# Patient Record
Sex: Male | Born: 1960
Health system: Southern US, Community
[De-identification: ages and names within clinical notes are randomized; demographics above are authoritative.]

## PROBLEM LIST (undated history)

## (undated) DIAGNOSIS — I2510 Atherosclerotic heart disease of native coronary artery without angina pectoris: Secondary | ICD-10-CM

## (undated) DIAGNOSIS — I10 Essential (primary) hypertension: Secondary | ICD-10-CM

## (undated) DIAGNOSIS — T8859XA Other complications of anesthesia, initial encounter: Secondary | ICD-10-CM

## (undated) DIAGNOSIS — I219 Acute myocardial infarction, unspecified: Secondary | ICD-10-CM

## (undated) DIAGNOSIS — I714 Abdominal aortic aneurysm, without rupture, unspecified: Secondary | ICD-10-CM

## (undated) DIAGNOSIS — I739 Peripheral vascular disease, unspecified: Secondary | ICD-10-CM

## (undated) DIAGNOSIS — M549 Dorsalgia, unspecified: Secondary | ICD-10-CM

## (undated) DIAGNOSIS — E785 Hyperlipidemia, unspecified: Secondary | ICD-10-CM

## (undated) DIAGNOSIS — I771 Stricture of artery: Secondary | ICD-10-CM

## (undated) HISTORY — PX: BACK SURGERY: SHX140

## (undated) HISTORY — DX: Acute myocardial infarction, unspecified: I21.9

## (undated) HISTORY — DX: Dorsalgia, unspecified: M54.9

## (undated) HISTORY — DX: Hyperlipidemia, unspecified: E78.5

## (undated) HISTORY — PX: TONSILLECTOMY: SUR1361

## (undated) SURGERY — Surgical Case
Anesthesia: *Unknown

---

## 2017-11-05 DIAGNOSIS — Z0189 Encounter for other specified special examinations: Secondary | ICD-10-CM | POA: Diagnosis not present

## 2017-11-05 DIAGNOSIS — Z6835 Body mass index (BMI) 35.0-35.9, adult: Secondary | ICD-10-CM | POA: Diagnosis not present

## 2017-11-05 DIAGNOSIS — S39012A Strain of muscle, fascia and tendon of lower back, initial encounter: Secondary | ICD-10-CM | POA: Diagnosis not present

## 2017-11-05 DIAGNOSIS — M545 Low back pain: Secondary | ICD-10-CM | POA: Diagnosis not present

## 2018-02-24 DIAGNOSIS — M533 Sacrococcygeal disorders, not elsewhere classified: Secondary | ICD-10-CM | POA: Diagnosis not present

## 2018-08-13 ENCOUNTER — Ambulatory Visit (INDEPENDENT_AMBULATORY_CARE_PROVIDER_SITE_OTHER): Payer: Medicare Other | Admitting: Urology

## 2018-08-13 DIAGNOSIS — N5201 Erectile dysfunction due to arterial insufficiency: Secondary | ICD-10-CM

## 2018-08-13 DIAGNOSIS — R3914 Feeling of incomplete bladder emptying: Secondary | ICD-10-CM | POA: Diagnosis not present

## 2018-09-17 ENCOUNTER — Ambulatory Visit (INDEPENDENT_AMBULATORY_CARE_PROVIDER_SITE_OTHER): Payer: Medicare Other | Admitting: Urology

## 2018-09-17 DIAGNOSIS — R3914 Feeling of incomplete bladder emptying: Secondary | ICD-10-CM | POA: Diagnosis not present

## 2018-09-17 DIAGNOSIS — N5201 Erectile dysfunction due to arterial insufficiency: Secondary | ICD-10-CM | POA: Diagnosis not present

## 2018-09-23 DIAGNOSIS — Z720 Tobacco use: Secondary | ICD-10-CM | POA: Diagnosis not present

## 2018-09-23 DIAGNOSIS — J309 Allergic rhinitis, unspecified: Secondary | ICD-10-CM | POA: Diagnosis not present

## 2018-09-23 DIAGNOSIS — G249 Dystonia, unspecified: Secondary | ICD-10-CM | POA: Diagnosis not present

## 2018-09-23 DIAGNOSIS — F17210 Nicotine dependence, cigarettes, uncomplicated: Secondary | ICD-10-CM | POA: Diagnosis not present

## 2018-09-23 DIAGNOSIS — Z79899 Other long term (current) drug therapy: Secondary | ICD-10-CM | POA: Diagnosis not present

## 2018-09-23 DIAGNOSIS — Z0189 Encounter for other specified special examinations: Secondary | ICD-10-CM | POA: Diagnosis not present

## 2018-09-23 DIAGNOSIS — Z6836 Body mass index (BMI) 36.0-36.9, adult: Secondary | ICD-10-CM | POA: Diagnosis not present

## 2018-09-23 DIAGNOSIS — N401 Enlarged prostate with lower urinary tract symptoms: Secondary | ICD-10-CM | POA: Diagnosis not present

## 2018-10-23 DIAGNOSIS — N5201 Erectile dysfunction due to arterial insufficiency: Secondary | ICD-10-CM | POA: Diagnosis not present

## 2018-12-09 DIAGNOSIS — J309 Allergic rhinitis, unspecified: Secondary | ICD-10-CM | POA: Diagnosis not present

## 2018-12-09 DIAGNOSIS — E782 Mixed hyperlipidemia: Secondary | ICD-10-CM | POA: Diagnosis not present

## 2018-12-09 DIAGNOSIS — M545 Low back pain: Secondary | ICD-10-CM | POA: Diagnosis not present

## 2018-12-09 DIAGNOSIS — N401 Enlarged prostate with lower urinary tract symptoms: Secondary | ICD-10-CM | POA: Diagnosis not present

## 2018-12-09 DIAGNOSIS — R229 Localized swelling, mass and lump, unspecified: Secondary | ICD-10-CM | POA: Diagnosis not present

## 2018-12-09 DIAGNOSIS — G249 Dystonia, unspecified: Secondary | ICD-10-CM | POA: Diagnosis not present

## 2018-12-09 DIAGNOSIS — R5383 Other fatigue: Secondary | ICD-10-CM | POA: Diagnosis not present

## 2018-12-09 DIAGNOSIS — Z720 Tobacco use: Secondary | ICD-10-CM | POA: Diagnosis not present

## 2018-12-09 DIAGNOSIS — E875 Hyperkalemia: Secondary | ICD-10-CM | POA: Diagnosis not present

## 2018-12-09 DIAGNOSIS — Z0000 Encounter for general adult medical examination without abnormal findings: Secondary | ICD-10-CM | POA: Diagnosis not present

## 2018-12-11 ENCOUNTER — Other Ambulatory Visit (HOSPITAL_BASED_OUTPATIENT_CLINIC_OR_DEPARTMENT_OTHER): Payer: Self-pay

## 2018-12-11 DIAGNOSIS — R5383 Other fatigue: Secondary | ICD-10-CM

## 2018-12-11 DIAGNOSIS — R0683 Snoring: Secondary | ICD-10-CM

## 2018-12-18 ENCOUNTER — Ambulatory Visit: Payer: Medicare Other | Attending: Internal Medicine | Admitting: Neurology

## 2018-12-18 ENCOUNTER — Other Ambulatory Visit: Payer: Self-pay

## 2018-12-18 DIAGNOSIS — G4733 Obstructive sleep apnea (adult) (pediatric): Secondary | ICD-10-CM | POA: Diagnosis not present

## 2018-12-18 DIAGNOSIS — R5383 Other fatigue: Secondary | ICD-10-CM | POA: Diagnosis not present

## 2018-12-18 DIAGNOSIS — R0683 Snoring: Secondary | ICD-10-CM | POA: Insufficient documentation

## 2018-12-27 NOTE — Procedures (Signed)
   [place] A. Merlene Laughter, MD     [email or web address]             HOME SLEEP STUDY  LOCATION: [place]  Patient Name: Anthony Todd, Anthony Todd Date: 12/18/2018 Gender: Male D.O.B: 05/16/1961 Age (years): 58 Referring Provider: Delphina Cahill Height (inches): 18 Interpreting Physician: Phillips Odor MD, ABSM Weight (lbs): 245 RPSGT: Peak, Robert BMI: 36 MRN: [id number] Neck Size: CLINICAL INFORMATION Sleep Study Type: HST     Indication for sleep study: Fatigue, Snoring     Epworth Sleepiness Score: NA  SLEEP STUDY TECHNIQUE A multi-channel overnight portable sleep study was performed. The channels recorded were: nasal airflow, thoracic respiratory movement, and oxygen saturation with a pulse oximetry. Snoring was also monitored.  MEDICATIONS No current outpatient medications on file.  SLEEP ARCHITECTURE Patient was studied for 342 minutes. The sleep efficiency was 71.3 % and the patient was supine for 5.1%. The arousal index was 0.0 per hour.  RESPIRATORY PARAMETERS The overall AHI was 2.1 per hour, with a central apnea index of 0.7 per hour.  The oxygen nadir was 83% during sleep.     CARDIAC DATA Mean heart rate during sleep was 70.1 bpm.  IMPRESSIONS No significant obstructive sleep apnea occurred during this study (AHI = 2.1/h). No significant central sleep apnea occurred during this study (CAI = 0.7/h).   Delano Metz, MD Diplomate, American Board of Sleep Medicine. ELECTRONICALLY SIGNED ON:  12/27/2018, 1:11 PM [place] PH: [phone]   FX: [phone] [place]

## 2019-01-07 ENCOUNTER — Ambulatory Visit: Payer: Medicare Other | Admitting: Urology

## 2019-01-07 ENCOUNTER — Other Ambulatory Visit: Payer: Medicare Other

## 2019-01-07 ENCOUNTER — Other Ambulatory Visit: Payer: Self-pay

## 2019-01-07 DIAGNOSIS — Z20822 Contact with and (suspected) exposure to covid-19: Secondary | ICD-10-CM

## 2019-01-07 DIAGNOSIS — R6889 Other general symptoms and signs: Secondary | ICD-10-CM | POA: Diagnosis not present

## 2019-01-07 NOTE — Progress Notes (Signed)
lab

## 2019-01-11 LAB — NOVEL CORONAVIRUS, NAA: SARS-CoV-2, NAA: NOT DETECTED

## 2019-01-20 ENCOUNTER — Telehealth: Payer: Self-pay | Admitting: Internal Medicine

## 2019-01-20 NOTE — Telephone Encounter (Signed)
General/Other - Results  °Covid-19 results given to patient ° °

## 2019-02-10 ENCOUNTER — Other Ambulatory Visit (HOSPITAL_COMMUNITY): Payer: Self-pay | Admitting: Family Medicine

## 2019-02-10 ENCOUNTER — Other Ambulatory Visit: Payer: Self-pay

## 2019-02-10 ENCOUNTER — Ambulatory Visit (HOSPITAL_COMMUNITY)
Admission: RE | Admit: 2019-02-10 | Discharge: 2019-02-10 | Disposition: A | Discharge status: Home / Self Care | Payer: Medicare Other | Source: Ambulatory Visit | Attending: Family Medicine | Admitting: Family Medicine

## 2019-02-10 DIAGNOSIS — M545 Low back pain, unspecified: Secondary | ICD-10-CM

## 2019-02-10 DIAGNOSIS — R5383 Other fatigue: Secondary | ICD-10-CM | POA: Diagnosis not present

## 2019-02-13 ENCOUNTER — Other Ambulatory Visit: Payer: Self-pay | Admitting: Internal Medicine

## 2019-02-13 ENCOUNTER — Other Ambulatory Visit (HOSPITAL_COMMUNITY): Payer: Self-pay | Admitting: Internal Medicine

## 2019-02-13 DIAGNOSIS — M5136 Other intervertebral disc degeneration, lumbar region: Secondary | ICD-10-CM

## 2019-02-17 ENCOUNTER — Other Ambulatory Visit: Payer: Self-pay

## 2019-02-17 ENCOUNTER — Ambulatory Visit (HOSPITAL_COMMUNITY)
Admission: RE | Admit: 2019-02-17 | Discharge: 2019-02-17 | Disposition: A | Discharge status: Home / Self Care | Payer: Medicare Other | Source: Ambulatory Visit | Attending: Internal Medicine | Admitting: Internal Medicine

## 2019-02-17 DIAGNOSIS — M545 Low back pain: Secondary | ICD-10-CM | POA: Diagnosis not present

## 2019-02-17 DIAGNOSIS — M5136 Other intervertebral disc degeneration, lumbar region: Secondary | ICD-10-CM | POA: Insufficient documentation

## 2019-02-18 ENCOUNTER — Ambulatory Visit (HOSPITAL_COMMUNITY): Payer: Medicare Other

## 2019-02-20 DIAGNOSIS — M5416 Radiculopathy, lumbar region: Secondary | ICD-10-CM | POA: Diagnosis not present

## 2019-02-20 DIAGNOSIS — M7070 Other bursitis of hip, unspecified hip: Secondary | ICD-10-CM | POA: Diagnosis not present

## 2019-02-26 ENCOUNTER — Other Ambulatory Visit: Payer: Self-pay | Admitting: Neurological Surgery

## 2019-02-26 DIAGNOSIS — M7070 Other bursitis of hip, unspecified hip: Secondary | ICD-10-CM

## 2019-02-26 DIAGNOSIS — M5416 Radiculopathy, lumbar region: Secondary | ICD-10-CM

## 2019-03-02 ENCOUNTER — Ambulatory Visit: Payer: Medicare Other | Attending: Internal Medicine | Admitting: Neurology

## 2019-03-02 ENCOUNTER — Other Ambulatory Visit: Payer: Self-pay

## 2019-03-02 DIAGNOSIS — J9601 Acute respiratory failure with hypoxia: Secondary | ICD-10-CM | POA: Diagnosis not present

## 2019-03-02 DIAGNOSIS — G4733 Obstructive sleep apnea (adult) (pediatric): Secondary | ICD-10-CM | POA: Diagnosis not present

## 2019-03-03 ENCOUNTER — Ambulatory Visit
Admission: RE | Admit: 2019-03-03 | Discharge: 2019-03-03 | Disposition: A | Discharge status: Home / Self Care | Payer: Medicare Other | Source: Ambulatory Visit | Attending: Neurological Surgery | Admitting: Neurological Surgery

## 2019-03-03 DIAGNOSIS — M5137 Other intervertebral disc degeneration, lumbosacral region: Secondary | ICD-10-CM | POA: Diagnosis not present

## 2019-03-03 DIAGNOSIS — M5416 Radiculopathy, lumbar region: Secondary | ICD-10-CM

## 2019-03-03 DIAGNOSIS — M5417 Radiculopathy, lumbosacral region: Secondary | ICD-10-CM | POA: Diagnosis not present

## 2019-03-03 MED ORDER — METHYLPREDNISOLONE ACETATE 40 MG/ML INJ SUSP (RADIOLOG: 120.0000 mg | Freq: Once | INTRAMUSCULAR | Status: DC

## 2019-03-03 MED ORDER — IOPAMIDOL (ISOVUE-M 200) INJECTION 41%: 1.0000 mL | Freq: Once | INTRAMUSCULAR | Status: DC

## 2019-03-03 NOTE — Discharge Instructions (Signed)

## 2019-03-05 NOTE — Procedures (Signed)
  [place] A. Merlene Laughter, MD     [email or web address]             HOME SLEEP TEST  LOCATION: [place]   Patient Name: Anthony Todd, Anthony Todd Date: 03/02/2019 Gender: Male D.O.B: [birth year] Age (years): 58 Referring Provider: Delphina Cahill Height (inches): 56 Interpreting Physician: Phillips Odor MD, ABSM Weight (lbs): 245 RPSGT: Peak, Robert BMI: 36 MRN: [id number] Neck Size: CLINICAL INFORMATION Sleep Study Type: HST     Indication for sleep study: N/A     Epworth Sleepiness Score: NA   Most recent polysomnogram dated 12/18/2018 revealed an AHI of 2.1/h and RDI of 2.1/h. SLEEP STUDY TECHNIQUE A multi-channel overnight portable sleep study was performed. The channels recorded were: nasal airflow, thoracic respiratory movement, and oxygen saturation with a pulse oximetry. Snoring was also monitored.  MEDICATIONS Patient self administered medications include: N/A.  SLEEP ARCHITECTURE Patient was studied for 362 minutes. The sleep efficiency was 82.6 % and the patient was supine for 5.9%. The arousal index was 0.0 per hour.  RESPIRATORY PARAMETERS The overall AHI was 6.3 per hour, with a central apnea index of 1.8 per hour.  The oxygen nadir was 89% during sleep.     CARDIAC DATA Mean heart rate during sleep was 67.3 bpm.  IMPRESSIONS 1.  Mild obstructive sleep apnea syndrome is noted with this study.  The severity does not require positive pressure treatment.  Delano Metz, MD Diplomate, American Board of Sleep Medicine.  ELECTRONICALLY SIGNED ON:  03/05/2019, 3:57 PM [place] PH: [phone]   FX: [phone] [place]

## 2019-03-12 DIAGNOSIS — G894 Chronic pain syndrome: Secondary | ICD-10-CM | POA: Diagnosis not present

## 2019-03-12 DIAGNOSIS — G4733 Obstructive sleep apnea (adult) (pediatric): Secondary | ICD-10-CM | POA: Diagnosis not present

## 2019-03-12 DIAGNOSIS — M545 Low back pain: Secondary | ICD-10-CM | POA: Diagnosis not present

## 2019-03-12 DIAGNOSIS — E782 Mixed hyperlipidemia: Secondary | ICD-10-CM | POA: Diagnosis not present

## 2019-03-20 DIAGNOSIS — M5431 Sciatica, right side: Secondary | ICD-10-CM | POA: Diagnosis not present

## 2019-03-20 DIAGNOSIS — M545 Low back pain: Secondary | ICD-10-CM | POA: Diagnosis not present

## 2019-03-20 DIAGNOSIS — M4726 Other spondylosis with radiculopathy, lumbar region: Secondary | ICD-10-CM | POA: Diagnosis not present

## 2019-03-20 DIAGNOSIS — M5432 Sciatica, left side: Secondary | ICD-10-CM | POA: Diagnosis not present

## 2019-03-20 DIAGNOSIS — M961 Postlaminectomy syndrome, not elsewhere classified: Secondary | ICD-10-CM | POA: Diagnosis not present

## 2019-03-24 DIAGNOSIS — G894 Chronic pain syndrome: Secondary | ICD-10-CM | POA: Diagnosis not present

## 2019-03-24 DIAGNOSIS — I714 Abdominal aortic aneurysm, without rupture: Secondary | ICD-10-CM | POA: Diagnosis not present

## 2019-03-24 DIAGNOSIS — M545 Low back pain: Secondary | ICD-10-CM | POA: Diagnosis not present

## 2019-03-24 DIAGNOSIS — G4733 Obstructive sleep apnea (adult) (pediatric): Secondary | ICD-10-CM | POA: Diagnosis not present

## 2019-03-26 ENCOUNTER — Other Ambulatory Visit: Payer: Self-pay

## 2019-03-26 ENCOUNTER — Encounter: Payer: Self-pay | Admitting: Vascular Surgery

## 2019-03-26 ENCOUNTER — Ambulatory Visit (INDEPENDENT_AMBULATORY_CARE_PROVIDER_SITE_OTHER): Payer: Medicare Other | Admitting: Vascular Surgery

## 2019-03-26 VITALS — BP 157/95 | HR 77 | Temp 97.2°F | Resp 20 | Ht 69.0 in | Wt 243.0 lb

## 2019-03-26 DIAGNOSIS — R7301 Impaired fasting glucose: Secondary | ICD-10-CM | POA: Diagnosis not present

## 2019-03-26 DIAGNOSIS — R37 Sexual dysfunction, unspecified: Secondary | ICD-10-CM | POA: Diagnosis not present

## 2019-03-26 DIAGNOSIS — E782 Mixed hyperlipidemia: Secondary | ICD-10-CM | POA: Diagnosis not present

## 2019-03-26 DIAGNOSIS — Z23 Encounter for immunization: Secondary | ICD-10-CM | POA: Diagnosis not present

## 2019-03-26 DIAGNOSIS — I714 Abdominal aortic aneurysm, without rupture, unspecified: Secondary | ICD-10-CM

## 2019-03-26 DIAGNOSIS — I739 Peripheral vascular disease, unspecified: Secondary | ICD-10-CM | POA: Diagnosis not present

## 2019-03-26 DIAGNOSIS — E875 Hyperkalemia: Secondary | ICD-10-CM | POA: Diagnosis not present

## 2019-03-26 NOTE — Progress Notes (Signed)
Patient name: Yamato Kopf MRN: [id number] DOB: 1960-12-13 Sex: male  REASON FOR CONSULT: Abdominal aortic aneurysm  HPI: Otoniel Myhand is a 58 y.o. male, who was discovered to have an incidental finding of a 3 cm infrarenal abdominal aortic aneurysm on recent MRI scan for back pain.  Patient has had chronic back pain for several years.  He has no family history of abdominal aortic aneurysm.  He currently smokes 2 pack of cigarettes per day.  Greater than 3 minutes today spent regarding smoking cessation counseling.  He has no abdominal pain.  He has had no prior abdominal operations.  He does describe a spasm type sensation in his left calf.  He does not really describe this when walking.  However, he has weakness that occurs in both legs with walking.  He is currently under the impression this is related to his back.  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  He is on a statin.   Past Medical History:  Diagnosis Date  . Back pain   . Hyperlipidemia    Past Surgical History:  Procedure Laterality Date  . BACK SURGERY      History reviewed. No pertinent family history.  SOCIAL HISTORY: Social History   Socioeconomic History  . Marital status: Married    Spouse name: Not on file  . Number of children: Not on file  . Years of education: Not on file  . Highest education level: Not on file  Occupational History  . Not on file  Social Needs  . Financial resource strain: Not on file  . Food insecurity    Worry: Not on file    Inability: Not on file  . Transportation needs    Medical: Not on file    Non-medical: Not on file  Tobacco Use  . Smoking status: Current Every Day Smoker    Packs/day: 2.00    Types: Cigarettes  . Smokeless tobacco: Never Used  Substance and Sexual Activity  . Alcohol use: Not Currently  . Drug use: Never  . Sexual activity: Not on file  Lifestyle  . Physical activity    Days per  week: Not on file    Minutes per session: Not on file  . Stress: Not on file  Relationships  . Social Herbalist on phone: Not on file    Gets together: Not on file    Attends religious service: Not on file    Active member of club or organization: Not on file    Attends meetings of clubs or organizations: Not on file    Relationship status: Not on file  . Intimate partner violence    Fear of current or ex partner: Not on file    Emotionally abused: Not on file    Physically abused: Not on file    Forced sexual activity: Not on file  Other Topics Concern  . Not on file  Social History Narrative  . Not on file    Allergies  Allergen Reactions  . Cymbalta [Duloxetine Hcl] Rash    Current Outpatient Medications  Medication Sig Dispense Refill  . oxyCODONE-acetaminophen (PERCOCET/ROXICET) 5-325 MG tablet TK 1 T PO BID PRF SEVERE PAIN    . simvastatin (ZOCOR) 10 MG tablet TK 1 T PO  QPM    . tamsulosin (FLOMAX) 0.4 MG CAPS capsule Take 0.4 mg by mouth at bedtime.  No current facility-administered medications for this visit.     ROS:   General:  No weight loss, Fever, chills  HEENT: No recent headaches, no nasal bleeding, no visual changes, no sore throat  Neurologic: No dizziness, blackouts, seizures. No recent symptoms of stroke or mini- stroke. No recent episodes of slurred speech, or temporary blindness.  Cardiac: No recent episodes of chest pain/pressure, no shortness of breath at rest.  No shortness of breath with exertion.  Denies history of atrial fibrillation or irregular heartbeat  Vascular: No history of rest pain in feet.  No history of claudication.  No history of non-healing ulcer, No history of DVT   Pulmonary: No home oxygen, no productive cough, no hemoptysis,  No asthma or wheezing  Musculoskeletal:  [X]  Arthritis, [X]  Low back pain,  [X]  Joint pain  Hematologic:No history of hypercoagulable state.  No history of easy bleeding.  No history  of anemia  Gastrointestinal: No hematochezia or melena,  No gastroesophageal reflux, no trouble swallowing  Urinary: [ ]  chronic Kidney disease, [ ]  on HD - [ ]  MWF or [ ]  TTHS, [ ]  Burning with urination, [ ]  Frequent urination, [ ]  Difficulty urinating;   Skin: No rashes  Psychological: No history of anxiety,  No history of depression   Physical Examination  Vitals:   03/26/19 1317  BP: (!) 157/95  Pulse: 77  Resp: 20  Temp: (!) 97.2 F (36.2 C)  SpO2: 94%  Weight: 243 lb (110.2 kg)  Height: 5\' 9"  (1.753 m)    Body mass index is 35.88 kg/m.  General:  Alert and oriented, no acute distress HEENT: Normal Neck: No JVD Pulmonary: Clear to auscultation bilaterally Cardiac: Regular Rate and Rhythm  Abdomen: Soft, non-tender, non-distended, no mass, no scars, diastases of midline fascia Skin: No rash Extremity Pulses:  2+ radial, brachial, 2+ right weakly palpable left femoral, absent right dorsalis pedis 2+ posterior tibial pulse absent left plus healed dorsalis pedis, posterior tibial pulses bilaterally Musculoskeletal: No deformity or edema  Neurologic: Upper and lower extremity motor 5/5 and symmetric  DATA:  I reviewed the patient's MRI exam which shows a focal area of 3 cm dilation of the abdominal aorta.  Adjacent abdominal aorta is about 2 to 2-1/2 cm.  Iliac arteries not imaged on the scan.  ASSESSMENT: Patient with small abdominal aortic aneurysm.  I discussed the patient that we would consider repair if aneurysm reaches 5 to 5-1/2 cm in diameter.  Also discussed the risk of rupture of less than 5 and half centimeter aneurysm is 0.5 %/year.  He was reassured by this.  I did discuss with him that we would need to reimage this in 2 to 3 years.  However, he also has some symptoms that may be claudication related and his iliac arteries have not been imaged.  With his history of smoking he also may have a component of peripheral arterial disease.  He also needs to be  evaluated to make sure he does not have an iliac aneurysm component.  In light of this we will schedule him for a CT angiogram of the abdomen and pelvis with lower extremity runoff to further define his arterial anatomy.  If he has significant peripheral arterial disease we may consider an intervention for the left leg since this seems to be very bothersome to him with walking.  This is a good portion of the reason he is having his back evaluated.   PLAN: #1 CT angiogram with lower extremity  runoff to evaluate peripheral arterial disease component as well as possibility of iliac aneurysms.  Patient will have a virtual visit and discussed the findings of his CT scan after this has been performed.  2.  Patient needs repeat ultrasound and a visit with our PA clinic in 2 to 3 years for repeat aortic ultrasound for the infrarenal abdominal aorta.   Fabienne Brunsharles , MD [facility] Office: [phone] Pager: [phone]

## 2019-03-31 ENCOUNTER — Other Ambulatory Visit: Payer: Self-pay | Admitting: Vascular Surgery

## 2019-03-31 DIAGNOSIS — I739 Peripheral vascular disease, unspecified: Secondary | ICD-10-CM

## 2019-03-31 DIAGNOSIS — I714 Abdominal aortic aneurysm, without rupture, unspecified: Secondary | ICD-10-CM

## 2019-04-01 DIAGNOSIS — K429 Umbilical hernia without obstruction or gangrene: Secondary | ICD-10-CM | POA: Diagnosis not present

## 2019-04-01 DIAGNOSIS — I714 Abdominal aortic aneurysm, without rupture: Secondary | ICD-10-CM | POA: Diagnosis not present

## 2019-04-01 DIAGNOSIS — E669 Obesity, unspecified: Secondary | ICD-10-CM | POA: Diagnosis not present

## 2019-04-02 DIAGNOSIS — I714 Abdominal aortic aneurysm, without rupture: Secondary | ICD-10-CM | POA: Diagnosis not present

## 2019-04-02 DIAGNOSIS — E782 Mixed hyperlipidemia: Secondary | ICD-10-CM | POA: Diagnosis not present

## 2019-04-02 DIAGNOSIS — G4733 Obstructive sleep apnea (adult) (pediatric): Secondary | ICD-10-CM | POA: Diagnosis not present

## 2019-04-02 DIAGNOSIS — N401 Enlarged prostate with lower urinary tract symptoms: Secondary | ICD-10-CM | POA: Diagnosis not present

## 2019-04-02 DIAGNOSIS — F5221 Male erectile disorder: Secondary | ICD-10-CM | POA: Diagnosis not present

## 2019-04-02 DIAGNOSIS — R7301 Impaired fasting glucose: Secondary | ICD-10-CM | POA: Diagnosis not present

## 2019-04-02 DIAGNOSIS — R945 Abnormal results of liver function studies: Secondary | ICD-10-CM | POA: Diagnosis not present

## 2019-04-02 DIAGNOSIS — G894 Chronic pain syndrome: Secondary | ICD-10-CM | POA: Diagnosis not present

## 2019-04-02 DIAGNOSIS — M545 Low back pain: Secondary | ICD-10-CM | POA: Diagnosis not present

## 2019-04-10 DIAGNOSIS — M5431 Sciatica, right side: Secondary | ICD-10-CM | POA: Diagnosis not present

## 2019-04-10 DIAGNOSIS — M4726 Other spondylosis with radiculopathy, lumbar region: Secondary | ICD-10-CM | POA: Diagnosis not present

## 2019-04-10 DIAGNOSIS — M961 Postlaminectomy syndrome, not elsewhere classified: Secondary | ICD-10-CM | POA: Diagnosis not present

## 2019-04-10 DIAGNOSIS — M5432 Sciatica, left side: Secondary | ICD-10-CM | POA: Diagnosis not present

## 2019-04-20 ENCOUNTER — Ambulatory Visit
Admission: RE | Admit: 2019-04-20 | Discharge: 2019-04-20 | Disposition: A | Discharge status: Home / Self Care | Payer: Medicare Other | Source: Ambulatory Visit | Attending: Vascular Surgery | Admitting: Vascular Surgery

## 2019-04-20 DIAGNOSIS — M545 Low back pain: Secondary | ICD-10-CM | POA: Diagnosis not present

## 2019-04-20 DIAGNOSIS — I739 Peripheral vascular disease, unspecified: Secondary | ICD-10-CM

## 2019-04-20 DIAGNOSIS — I714 Abdominal aortic aneurysm, without rupture, unspecified: Secondary | ICD-10-CM

## 2019-04-20 DIAGNOSIS — E291 Testicular hypofunction: Secondary | ICD-10-CM | POA: Diagnosis not present

## 2019-04-20 MED ORDER — IOPAMIDOL (ISOVUE-370) INJECTION 76%
100.0000 mL | Freq: Once | INTRAVENOUS | Status: AC | PRN
  Administered 2019-04-20: 100 mL via INTRAVENOUS

## 2019-04-22 ENCOUNTER — Telehealth: Payer: Self-pay | Admitting: *Deleted

## 2019-04-22 NOTE — Telephone Encounter (Signed)
Virtual Visit Pre-Appointment Phone Call  Today, I spoke with Anthony Todd and performed the following actions:  1. I explained that we are currently trying to limit exposure to the COVID-19 virus by seeing patients at home rather than in the office.  I explained that the visits are best done by video, but can be done by telephone.  I asked the patient if a virtual visit that the patient would like to try instead of coming into the office. Anthony Todd agreed to proceed with the virtual visit scheduled with Ruta Hinds MD on 04/22/19.     2. I confirmed the BEST phone number to call the day of the visit and- I included this in appointment notes.  3. I asked if the patient had access to (through a family member/friend) a smartphone with video capability to be used for his visit?"  The patient said yes -   4. I confirmed consent by  a. sending through [place] or by email the [place] as written at the end of this message or  b. verbally as listed below. i. This visit is being performed in the setting of COVID-19. ii. All virtual visits are billed to your insurance company just like a normal visit would be.   iii. We'd like you to understand that the technology does not allow for your provider to perform an examination, and thus may limit your provider's ability to fully assess your condition.  iv. If your provider identifies any concerns that need to be evaluated in person, we will make arrangements to do so.   v. Finally, though the technology is pretty good, we cannot assure that it will always work on either your or our end, and in the setting of a video visit, we may have to convert it to a phone-only visit.  In either situation, we cannot ensure that we have a secure connection.   vi. Are you willing to proceed?"  STAFF: Did the patient verbally acknowledge consent to telehealth visit? Document YES/NO here: *Yes  2. I advised the patient to be prepared  - I asked that the patient, on the day of his visit, record any information possible with the equipment at his home, such as blood pressure, pulse, oxygen saturation, and your weight and write them all down. I asked the patient to have a pen and paper handy nearby the day of the visit as well.  3. If the patient was scheduled for a video visit, I informed the patient that the visit with the doctor would start with a text to the smartphone # given to [place] by the patient.         If the patient was scheduled for a telephone call, I informed the patient that the visit with the doctor would start with a call to the telephone # given to [place] by the patient.  4. I Informed patient they will receive a phone call 15 minutes prior to their appointment time from a [place] or nurse to review medications, allergies, etc. to prepare for the visit.    TELEPHONE CALL NOTE  Anthony Todd has been deemed a candidate for a follow-up tele-health visit to limit community exposure during the Covid-19 pandemic. I spoke with the patient via phone to ensure availability of phone/video source, confirm preferred email & phone number, and discuss instructions and expectations.  I reminded Anthony Todd to be prepared with any vital sign and/or heart rhythm information that  could potentially be obtained via home monitoring, at the time of his visit. I reminded Anthony Todd to expect a phone call prior to his visit.  Anthony Todd, NT 04/22/2019 10:55 AM     FULL LENGTH CONSENT FOR TELE-HEALTH VISIT   I hereby voluntarily request, consent and authorize [facility] and its employed or contracted physicians, physician assistants, nurse practitioners or other licensed health care professionals (the Practitioner), to provide me with telemedicine health care services (the "Services") as deemed necessary by the treating Practitioner. I acknowledge and consent to receive the Services by the Practitioner via telemedicine. I understand that  the telemedicine visit will involve communicating with the Practitioner through live audiovisual communication technology and the disclosure of certain medical information by electronic transmission. I acknowledge that I have been given the opportunity to request an in-person assessment or other available alternative prior to the telemedicine visit and am voluntarily participating in the telemedicine visit.  I understand that I have the right to withhold or withdraw my consent to the use of telemedicine in the course of my care at any time, without affecting my right to future care or treatment, and that the Practitioner or I may terminate the telemedicine visit at any time. I understand that I have the right to inspect all information obtained and/or recorded in the course of the telemedicine visit and may receive copies of available information for a reasonable fee.  I understand that some of the potential risks of receiving the Services via telemedicine include:  [place] Delay or interruption in medical evaluation due to technological equipment failure or disruption; . Information transmitted may not be sufficient (e.g. poor resolution of images) to allow for appropriate medical decision making by the Practitioner; and/or  . In rare instances, security protocols could fail, causing a breach of personal health information.  Furthermore, I acknowledge that it is my responsibility to provide information about my medical history, conditions and care that is complete and accurate to the best of my ability. I acknowledge that Practitioner's advice, recommendations, and/or decision may be based on factors not within their control, such as incomplete or inaccurate data provided by me or distortions of diagnostic images or specimens that may result from electronic transmissions. I understand that the practice of medicine is not an exact science and that Practitioner makes no warranties or guarantees regarding treatment  outcomes. I acknowledge that I will receive a copy of this consent concurrently upon execution via email to the email address I last provided but may also request a printed copy by calling the office of [facility].    I understand that my insurance will be billed for this visit.   I have read or had this consent read to me. . I understand the contents of this consent, which adequately explains the benefits and risks of the Services being provided via telemedicine.  . I have been provided ample opportunity to ask questions regarding this consent and the Services and have had my questions answered to my satisfaction. . I give my informed consent for the services to be provided through the use of telemedicine in my medical care  By participating in this telemedicine visit I agree to the above.

## 2019-04-23 ENCOUNTER — Ambulatory Visit (INDEPENDENT_AMBULATORY_CARE_PROVIDER_SITE_OTHER): Payer: Medicare Other | Admitting: Vascular Surgery

## 2019-04-23 ENCOUNTER — Other Ambulatory Visit: Payer: Self-pay

## 2019-04-23 DIAGNOSIS — I714 Abdominal aortic aneurysm, without rupture, unspecified: Secondary | ICD-10-CM

## 2019-04-23 DIAGNOSIS — I739 Peripheral vascular disease, unspecified: Secondary | ICD-10-CM

## 2019-04-23 NOTE — Progress Notes (Signed)
Virtual Visit via Video Note   I connected with Anthony Todd on 04/23/2019 using the telephone platform and verified that I was speaking with the correct person using two identifiers.  The patient did not have technical capacity for a video virtual visit.  Patient was located at home and accompanied by no one. I am located at our office on [facility].   The limitations of evaluation and management by telemedicine and the availability of in person appointments have been previously discussed with the patient and are documented in the patients chart. The patient expressed understanding and consented to proceed.   PCP: Celene Squibb, MD  History of Present Illness: Patient is a 58 year old male who returns for follow-up today.  He was last seen on March 26, 2019.  He was initially seen for an incidental finding of a 3 cm infrarenal abdominal aortic aneurysm seen on recent MRI scan for back pain.  Returns today for follow-up regarding CT angio with runoff.  He currently has weakness that occurs in both legs with walking.  He has been seen by a spine surgeon regarding this and they are contemplating an operation.  The patient did ask today about whether or not erectile dysfunction could be related to his arterial occlusive disease.  I discussed with him that most likely this is related to his cigarette smoking and small vessel disease rather than the obstruction that we found on the CT scan.  Patient states that his left leg is worse than his right.  He has some symptoms that are suggestive of claudication but certainly not classic.   Past Medical History:  Diagnosis Date  . Back pain   . Hyperlipidemia     Past Surgical History:  Procedure Laterality Date  . BACK SURGERY      Current Meds  Medication Sig  . oxyCODONE-acetaminophen (PERCOCET/ROXICET) 5-325 MG tablet TK 1 T PO BID PRF SEVERE PAIN  . simvastatin (ZOCOR) 10 MG tablet TK 1 T PO  QPM  . tamsulosin (FLOMAX) 0.4 MG CAPS  capsule Take 0.4 mg by mouth at bedtime.     Observations/Objective: Reviewed the patient's images from his CT angiogram.  This is a 50% right common iliac artery stenosis.  Otherwise the right lower extremity has intact runoff with no other significant flow-limiting stenosis below this.  On the left side the left common iliac artery is occluded.  It is atretic.  This is suggestive of a congenital lesion but certainly potentially related to his smoking.  His left groin down to the foot has normal arterial circulation with no flow-limiting stenosis.  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  He has some symptoms suggestive of claudication.  However percent of his symptoms are probably related to his back pain as well.  I discussed with the patient the possibility of revascularization to improve his symptoms.  However, I also discussed with him that all of his symptoms might not resolve if some of this is related to his back issues.  Certainly some of his left leg symptoms would be secondary to the left common iliac artery occlusion.  Follow Up Instructions:  The patient is scheduled to have a back operation in the near future.  I told him that he should discuss the findings from his CT scan and that I would forward my note to his spine surgeon.  A decision will need to be made whether or  not we are going to pursue further options for revascularization of his iliac artery versus whether or not he would proceed with his spine surgery first.  Patient also does have a 3 cm abdominal aortic aneurysm.  This will need to be followed long-term.  He will need a repeat ultrasound of this in 2 years regardless.   I discussed the assessment and treatment plan with the patient. The patient was provided an opportunity to ask questions and all were answered. The patient agreed with the plan and demonstrated an understanding of the instructions.    The patient was advised to call back or seek an in-person evaluation if the symptoms worsen or if the condition fails to improve as anticipated.  I spent 15 minutes with the patient via telephone encounter.   Signed, Fabienne Bruns [facility] [place] Office: [phone]  04/23/2019, 6:55 PM

## 2019-04-24 ENCOUNTER — Telehealth: Payer: Self-pay

## 2019-04-24 NOTE — Telephone Encounter (Signed)
Pt called and said that he wanted to proceed with surgery and said that he was talking with his spine dr and they agreed. He said that he is in a lot of pain and would like [place] to refill his pain meds.   Called pt and told him that when Dr Oneida Alar is back in the office I will discuss with him the steps to proceed with surgery if the surgeon agrees that will be doing his spine.  Advised him that in regards to the pain meds, he would need to contact their office as they have been giving him narcotics for his back pain. Explained only one dr can write meds for narcotics.   York Cerise, CMA

## 2019-05-18 ENCOUNTER — Other Ambulatory Visit: Payer: Self-pay

## 2019-05-20 ENCOUNTER — Telehealth: Payer: Self-pay | Admitting: *Deleted

## 2019-05-20 NOTE — Telephone Encounter (Signed)
Patient called and instructed to be at [facility] admitting at 7:30 am on 06/05/2019. All other instructions unchanged. Understands nasal swab at [facility] on 06/03/2019.

## 2019-06-03 ENCOUNTER — Other Ambulatory Visit: Payer: Self-pay

## 2019-06-03 ENCOUNTER — Other Ambulatory Visit (HOSPITAL_COMMUNITY)
Admission: RE | Admit: 2019-06-03 | Discharge: 2019-06-03 | Disposition: A | Discharge status: Home / Self Care | Payer: Medicare Other | Source: Ambulatory Visit | Attending: Vascular Surgery | Admitting: Vascular Surgery

## 2019-06-03 DIAGNOSIS — Z01812 Encounter for preprocedural laboratory examination: Secondary | ICD-10-CM | POA: Diagnosis not present

## 2019-06-03 DIAGNOSIS — Z20828 Contact with and (suspected) exposure to other viral communicable diseases: Secondary | ICD-10-CM | POA: Insufficient documentation

## 2019-06-03 LAB — SARS CORONAVIRUS 2 (TAT 6-24 HRS): SARS Coronavirus 2: NEGATIVE

## 2019-06-05 ENCOUNTER — Ambulatory Visit (HOSPITAL_COMMUNITY)
Admission: RE | Admit: 2019-06-05 | Discharge: 2019-06-05 | Disposition: A | Discharge status: Home / Self Care | Payer: Medicare Other | Attending: Vascular Surgery | Admitting: Vascular Surgery

## 2019-06-05 ENCOUNTER — Other Ambulatory Visit: Payer: Self-pay

## 2019-06-05 ENCOUNTER — Encounter (HOSPITAL_COMMUNITY)
Admission: RE | Disposition: A | Discharge status: Home / Self Care | Payer: Self-pay | Source: Home / Self Care | Attending: Vascular Surgery

## 2019-06-05 ENCOUNTER — Telehealth: Payer: Self-pay | Admitting: Vascular Surgery

## 2019-06-05 DIAGNOSIS — F17210 Nicotine dependence, cigarettes, uncomplicated: Secondary | ICD-10-CM | POA: Insufficient documentation

## 2019-06-05 DIAGNOSIS — Z79899 Other long term (current) drug therapy: Secondary | ICD-10-CM | POA: Diagnosis not present

## 2019-06-05 DIAGNOSIS — I745 Embolism and thrombosis of iliac artery: Secondary | ICD-10-CM | POA: Insufficient documentation

## 2019-06-05 DIAGNOSIS — E785 Hyperlipidemia, unspecified: Secondary | ICD-10-CM | POA: Diagnosis not present

## 2019-06-05 DIAGNOSIS — Z888 Allergy status to other drugs, medicaments and biological substances status: Secondary | ICD-10-CM | POA: Insufficient documentation

## 2019-06-05 DIAGNOSIS — I714 Abdominal aortic aneurysm, without rupture: Secondary | ICD-10-CM | POA: Diagnosis not present

## 2019-06-05 DIAGNOSIS — I70212 Atherosclerosis of native arteries of extremities with intermittent claudication, left leg: Secondary | ICD-10-CM

## 2019-06-05 HISTORY — PX: ABDOMINAL AORTOGRAM W/LOWER EXTREMITY: CATH118223

## 2019-06-05 HISTORY — PX: PERIPHERAL VASCULAR BALLOON ANGIOPLASTY: CATH118281

## 2019-06-05 LAB — POCT ACTIVATED CLOTTING TIME
Activated Clotting Time: 197 seconds
Activated Clotting Time: 241 seconds
Activated Clotting Time: 186 seconds

## 2019-06-05 LAB — POCT I-STAT, CHEM 8
BUN: 10 mg/dL (ref 6–20)
Calcium, Ion: 1.19 mmol/L (ref 1.15–1.40)
Chloride: 103 mmol/L (ref 98–111)
Creatinine, Ser: 1 mg/dL (ref 0.61–1.24)
Glucose, Bld: 93 mg/dL (ref 70–99)
HCT: 46 % (ref 39.0–52.0)
Hemoglobin: 15.6 g/dL (ref 13.0–17.0)
Potassium: 4 mmol/L (ref 3.5–5.1)
Sodium: 140 mmol/L (ref 135–145)
TCO2: 27 mmol/L (ref 22–32)

## 2019-06-05 SURGERY — ABDOMINAL AORTOGRAM W/LOWER EXTREMITY
Anesthesia: LOCAL

## 2019-06-05 MED ORDER — LIDOCAINE HCL (PF) 1 % IJ SOLN
INTRAMUSCULAR | Status: DC | PRN
  Administered 2019-06-05 (×2): 30 mL

## 2019-06-05 MED ORDER — ACETAMINOPHEN 325 MG PO TABS
650.0000 mg | ORAL_TABLET | ORAL | Status: DC | PRN
  Administered 2019-06-05: 650 mg via ORAL
  Filled 2019-06-05: qty 2

## 2019-06-05 MED ORDER — HEPARIN (PORCINE) IN NACL 1000-0.9 UT/500ML-% IV SOLN
INTRAVENOUS | Status: DC | PRN
  Administered 2019-06-05: 500 mL

## 2019-06-05 MED ORDER — SODIUM CHLORIDE 0.9 % IV SOLN: INTRAVENOUS | Status: DC

## 2019-06-05 MED ORDER — HEPARIN (PORCINE) IN NACL 1000-0.9 UT/500ML-% IV SOLN
INTRAVENOUS | Status: AC
  Filled 2019-06-05: qty 1000

## 2019-06-05 MED ORDER — ONDANSETRON HCL 4 MG/2ML IJ SOLN: 4.0000 mg | Freq: Four times a day (QID) | INTRAMUSCULAR | Status: DC | PRN

## 2019-06-05 MED ORDER — SODIUM CHLORIDE 0.9% FLUSH: 3.0000 mL | Freq: Two times a day (BID) | INTRAVENOUS | Status: DC

## 2019-06-05 MED ORDER — MORPHINE SULFATE (PF) 4 MG/ML IV SOLN
2.0000 mg | INTRAVENOUS | Status: DC | PRN
  Administered 2019-06-05: 2 mg via INTRAVENOUS

## 2019-06-05 MED ORDER — SODIUM CHLORIDE 0.9% FLUSH: 3.0000 mL | INTRAVENOUS | Status: DC | PRN

## 2019-06-05 MED ORDER — LABETALOL HCL 5 MG/ML IV SOLN: 10.0000 mg | INTRAVENOUS | Status: DC | PRN

## 2019-06-05 MED ORDER — HEPARIN SODIUM (PORCINE) 1000 UNIT/ML IJ SOLN
INTRAMUSCULAR | Status: DC | PRN
  Administered 2019-06-05: 10000 [IU] via INTRAVENOUS

## 2019-06-05 MED ORDER — HEPARIN SODIUM (PORCINE) 1000 UNIT/ML IJ SOLN
INTRAMUSCULAR | Status: AC
  Filled 2019-06-05: qty 1

## 2019-06-05 MED ORDER — HYDRALAZINE HCL 20 MG/ML IJ SOLN: 5.0000 mg | INTRAMUSCULAR | Status: DC | PRN

## 2019-06-05 MED ORDER — MORPHINE SULFATE (PF) 2 MG/ML IV SOLN
INTRAVENOUS | Status: AC
  Filled 2019-06-05: qty 1

## 2019-06-05 MED ORDER — SODIUM CHLORIDE 0.9 % IV SOLN: 250.0000 mL | INTRAVENOUS | Status: DC | PRN

## 2019-06-05 MED ORDER — SODIUM CHLORIDE 0.9 % IV SOLN
INTRAVENOUS | Status: DC
  Administered 2019-06-05: 09:00:00 via INTRAVENOUS

## 2019-06-05 MED ORDER — LIDOCAINE HCL (PF) 1 % IJ SOLN
INTRAMUSCULAR | Status: AC
  Filled 2019-06-05: qty 30

## 2019-06-05 SURGICAL SUPPLY — 14 items
CATH ANGIO 5F PIGTAIL 65CM (CATHETERS) ×3 IMPLANT
CATH BEACON 5 .035 65 KMP TIP (CATHETERS) ×3 IMPLANT
CATH CROSS OVER TEMPO 5F (CATHETERS) ×3 IMPLANT
CATH SOFTOUCH MOTARJEME 5F (CATHETERS) ×3 IMPLANT
GUIDEWIRE ANGLED .035X150CM (WIRE) ×3 IMPLANT
KIT ENCORE 26 ADVANTAGE (KITS) ×3 IMPLANT
KIT PV (KITS) ×3 IMPLANT
SHEATH PINNACLE 5F 10CM (SHEATH) ×6 IMPLANT
SHEATH PROBE COVER 6X72 (BAG) ×3 IMPLANT
SYR MEDRAD MARK V 150ML (SYRINGE) ×3 IMPLANT
TRANSDUCER W/STOPCOCK (MISCELLANEOUS) ×3 IMPLANT
TRAY PV CATH (CUSTOM PROCEDURE TRAY) ×3 IMPLANT
WIRE BENTSON .035X145CM (WIRE) ×3 IMPLANT
WIRE HITORQ VERSACORE ST 145CM (WIRE) ×3 IMPLANT

## 2019-06-05 NOTE — Telephone Encounter (Signed)
   Primary Cardiologist:No primary care provider on file.  Chart reviewed as part of pre-operative protocol coverage. Because of Anthony Todd past medical history and time since last visit, he/she will require a follow-up visit in order to better assess preoperative cardiovascular risk.  This patient has never been seen by [facility] and will therefore require a new patient evaluation by MD prior to cardiac clearance.   Pre-op covering staff: - Please schedule appointment and call patient to inform them. - Please contact requesting surgeon's office via preferred method (i.e, phone, fax) to inform them of need for appointment prior to surgery.  If applicable, this message will also be routed to pharmacy pool and/or primary cardiologist for input on holding anticoagulant/antiplatelet agent as requested below so that this information is available at time of patient's appointment.   Kathyrn Drown, NP  06/05/2019, 4:01 PM

## 2019-06-05 NOTE — Telephone Encounter (Signed)
°  ° °  [facility] Pre-operative Risk Assessment    Request for surgical clearance:  1. What type of surgery is being performed? Femoral Femoral Bypass   2. When is this surgery scheduled? 06-15-19  3. What type of clearance is required (medical clearance vs. Pharmacy clearance to hold med vs. Both)? both  4. Are there any medications that need to be held prior to surgery and how long? [facility] Clinic is not sure. Up to the doctors  5. Practice name and name of physician performing surgery? Dr. Oneida Alar  6. What is your office phone number:    7.   What is your office fax number: [phone]  8.   Anesthesia type (None, local, MAC, general) ?:    Lynn from the [facility] was hoping we could see the patient and give him surgical clearance for this procedure. The patient is not established with a cardiologist, but will need cardiac clearance for his procedure.   Jeani Hawking would like the clearance appt to be done ASAP, because otherwise this procedure will have to be rescheduled.   Johnna Acosta 06/05/2019, 3:24 PM  _________________________________________________________________   (provider comments below)

## 2019-06-05 NOTE — Discharge Instructions (Signed)

## 2019-06-05 NOTE — Progress Notes (Signed)
Site area: Left groin a 5 french arterial sheath was removed  Site Prior to Removal:  Level 0  Pressure Applied For 20 MINUTES    Bedrest Beginning at 1430p  Manual:   Yes.    Patient Status During Pull:  stable  Post Pull Groin Site:  Level 0  Post Pull Instructions Given:  Yes.    Post Pull Pulses Present:  Yes.    Dressing Applied:  Yes.    Comments:

## 2019-06-05 NOTE — Progress Notes (Signed)
VS pre sheath pull: BP 147/67, HR 73, RR 15, O2 sat 98% room air Sheath location: right femoral Arterial/Venous: artery Manual pressure hold time: 20 minutes Bedrest begins: 1430 Pulses: right PT palpable, doppler left PT  Site condition: level 0 VS post sheath pull: BP 124/57, RR 13, HR 65, O2 sats 97% Post instructions given to patient.

## 2019-06-05 NOTE — H&P (Signed)
Patient name: Anthony Todd MRN: [id number] DOB: 12/20/1960 Sex: male  REASON FOR CONSULT: Abdominal aortic aneurysm  HPI:  Anthony Todd is a 58 y.o. male, who was discovered to have an incidental finding of a 3 cm infrarenal abdominal aortic aneurysm on recent MRI scan for back pain. Patient has had chronic back pain for several years. He has no family history of abdominal aortic aneurysm. He currently smokes 2 pack of cigarettes per day. Greater than 3 minutes today spent regarding smoking cessation counseling. He has no abdominal pain. He has had no prior abdominal operations. He does describe a spasm type sensation in his left calf. He does not really describe this when walking. However, he has weakness that occurs in both legs with walking. He is currently under the impression this is related to his back. 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 He is on a statin.      Past Medical History:  Diagnosis Date  . Back pain   . Hyperlipidemia         Past Surgical History:  Procedure Laterality Date  . BACK SURGERY    History reviewed. No pertinent family history.  SOCIAL HISTORY:  Social History        Socioeconomic History  . Marital status: Married    Spouse name: Not on file  . Number of children: Not on file  . Years of education: Not on file  . Highest education level: Not on file  Occupational History  . Not on file  Social Needs  . Financial resource strain: Not on file  . Food insecurity    Worry: Not on file    Inability: Not on file  . Transportation needs    Medical: Not on file    Non-medical: Not on file  Tobacco Use  . Smoking status: Current Every Day Smoker    Packs/day: 2.00    Types: Cigarettes  . Smokeless tobacco: Never Used  Substance and Sexual Activity  . Alcohol use: Not Currently  . Drug use: Never  . Sexual activity: Not on file  Lifestyle  . Physical activity    Days per week:  Not on file    Minutes per session: Not on file  . Stress: Not on file  Relationships  . Social Herbalist on phone: Not on file    Gets together: Not on file    Attends religious service: Not on file    Active member of club or organization: Not on file    Attends meetings of clubs or organizations: Not on file    Relationship status: Not on file  . Intimate partner violence    Fear of current or ex partner: Not on file    Emotionally abused: Not on file    Physically abused: Not on file    Forced sexual activity: Not on file  Other Topics Concern  . Not on file  Social History Narrative  . Not on file       Allergies  Allergen Reactions  . Cymbalta [Duloxetine Hcl] Rash         Current Outpatient Medications  Medication Sig Dispense Refill  . oxyCODONE-acetaminophen (PERCOCET/ROXICET) 5-325 MG tablet TK 1 T PO BID PRF SEVERE PAIN    . simvastatin (ZOCOR) 10 MG tablet TK 1 T PO QPM    . tamsulosin (FLOMAX) 0.4 MG CAPS capsule Take 0.4 mg by  mouth at bedtime.     No current facility-administered medications for this visit.   ROS:  General: No weight loss, Fever, chills  HEENT: No recent headaches, no nasal bleeding, no visual changes, no sore throat  Neurologic: No dizziness, blackouts, seizures. No recent symptoms of stroke or mini- stroke. No recent episodes of slurred speech, or temporary blindness.  Cardiac: No recent episodes of chest pain/pressure, no shortness of breath at rest. No shortness of breath with exertion. Denies history of atrial fibrillation or irregular heartbeat  Vascular: No history of rest pain in feet. No history of claudication. No history of non-healing ulcer, No history of DVT  Pulmonary: No home oxygen, no productive cough, no hemoptysis, No asthma or wheezing  Musculoskeletal: [X]  Arthritis, [X]  Low back pain, [X]  Joint pain  Hematologic:No history of hypercoagulable state. No history of easy bleeding. No history of anemia   Gastrointestinal: No hematochezia or melena, No gastroesophageal reflux, no trouble swallowing  Urinary: [ ]  chronic Kidney disease, [ ]  on HD - [ ]  MWF or [ ]  TTHS, [ ]  Burning with urination, [ ]  Frequent urination, [ ]  Difficulty urinating;  Skin: No rashes  Psychological: No history of anxiety, No history of depression  Physical Examination   Vitals:   06/05/19 0744  BP: (!) 177/98  Pulse: 81  Resp: 18  Temp: 97.7 F (36.5 C)  TempSrc: Skin  SpO2: 96%  Weight: 107.5 kg  Height: 5\' 9"  (1.753 m)   Body mass index is 35.88 kg/m.   General: Alert and oriented, no acute distress  HEENT: Normal  Neck: No JVD  Pulmonary: Clear to auscultation bilaterally  Cardiac: Regular Rate and Rhythm  Abdomen: Soft, non-tender, non-distended, no mass, no scars, diastases of midline fascia  Skin: No rash  Extremity Pulses: 2+ radial, brachial, 2+ right weakly palpable left femoral, absent right dorsalis pedis 2+ posterior tibial pulse absent left plus healed dorsalis pedis, posterior tibial pulses bilaterally  Musculoskeletal: No deformity or edema  Neurologic: Upper and lower extremity motor 5/5 and symmetric   DATA:  I reviewed the patient's MRI exam which shows a focal area of 3 cm dilation of the abdominal aorta. Adjacent abdominal aorta is about 2 to 2-1/2 cm. Iliac arteries not imaged on the scan.   ASSESSMENT: Patient with small abdominal aortic aneurysm. I discussed the patient that we would consider repair if aneurysm reaches 5 to 5-1/2 cm in diameter. Also discussed the risk of rupture of less than 5 and half centimeter aneurysm is 0.5 %/year. He was reassured by this. I did discuss with him that we would need to reimage this in 2 to 3 years.   Left common iliac occlusion  PLAN: Aortogram with runoff today possible intervention  , MD  [facility]  Office: [phone]  Pager: [phone]

## 2019-06-05 NOTE — Telephone Encounter (Signed)
Pt already scheduled to see Dr. Gardiner Rhyme, 06/09/2019. Will fax back to the requesting surgeon's office back to make them aware.

## 2019-06-05 NOTE — Op Note (Signed)
Procedure: Attempted recanalization left common iliac artery, bilateral retrograde common femoral artery cannulation, ultrasound bilateral groin, aortogram with bilateral lower extremity runoff  Reoperative diagnosis: Claudication left leg  Postoperative diagnosis: Same  Anesthesia: Local  Operative findings: #1 chronic occlusion left common iliac artery unable to reenter true lumen on attempt at recanalization  2.  Bilateral intact lower extremity runoff  Operative details: After obtaining form consent, the patient taken the PV lab.  The patient was placed in supine position angio table.  Both groins were prepped and draped in usual sterile fashion.  Ultrasound was used to identify the left common femoral artery and femoral bifurcation.  Local anesthesia was infiltrated over this.  Introducer needle was then used to cannulate left common femoral artery and an 035 versa core wire advanced into the left pelvis under fluoroscopic guidance.  The guidewire would not advance all the way into the aorta.  5 French sheath placed over the guidewire in the left common femoral artery.  Contrast angiogram was then obtained which is chronic occlusion of the left common iliac artery consistent with the patient's CT scan.  The occlusion comes all the way down to the iliac bifurcation.  This point I tried to advance an 035 angled Glidewire using support from a KMP catheter.  I was able to get the wire to reflux on itself and advanced all the way to the aortic bifurcation but could not get the wire to reenter the true lumen of the aorta.  At this point I decided to obtain access on the right groin.  Ultrasound was used to identify the right common femoral artery and femoral bifurcation.  Introducer needle was used to cannulate the right common femoral artery and a 035 Bentson wire advanced into the right pelvis under fluoroscopic guidance.  A 5 French sheath placed over the guidewire in the right common femoral artery.   5 French pigtail catheter was then advanced over the guidewire to the abdominal aorta and abdominal aortogram was obtained in AP projection.  This shows left and right renal arteries are widely patent with no significant stenosis.  There is a occlusion of the left common iliac artery with a small area at the origin that is visualized.  The right common iliac artery has mild narrowing at its origin.  Is less than 50%.  The right external and internal iliac arteries are patent.  I then tried coming antegrade with first a crossover catheter and then a target a catheter to advance a guidewire antegrade across the left common iliac lesion.  Several attempts at this were all unsuccessful.  At this point the pigtail catheter was placed back up the right side and lower extremity runoff views were obtained.  In the left lower extremity, the left common iliac is occluded.  The internal and external iliac arteries are patent.  The left common femoral profundofemoral superficial femoral-popliteal posterior tibial anterior tibial and peroneal arteries are all patent.  In the right lower extremity there is no significant flow-limiting lesion.  All vessels are patent.  At this point pigtail catheter was removed as a over a guidewire.  The patient had been given 10,000 units of heparin during the attempted recanalization.  Therefore both sheaths were left in place to be pulled in the holding area after ACT is less than 175.  Patient tired procedure well no complications.  Patient taken the holding area in stable condition.  Operative management: The patient will be scheduled in the near future for a  right to left femoral-femoral bypass.  Fabienne Bruns, MD [facility] Office: [phone]

## 2019-06-08 ENCOUNTER — Encounter (HOSPITAL_COMMUNITY): Payer: Self-pay | Admitting: Vascular Surgery

## 2019-06-08 NOTE — Progress Notes (Deleted)
Cardiology Office Note:    Date:  06/08/2019   ID:  Anthony Todd, DOB 08-Apr-1961, MRN [id number]  PCP:  Celene Squibb, MD  Cardiologist:  No primary care provider on file.  Electrophysiologist:  None   Referring MD: Celene Squibb, MD   No chief complaint on file. ***  History of Present Illness:    Anthony Todd is a 58 y.o. male with a hx of hyperlipidemia, PAD who is referred for preop evaluation by Dr. Nevada Crane prior to femoral-femoral bypass.  Past Medical History:  Diagnosis Date  . Back pain   . Hyperlipidemia     Past Surgical History:  Procedure Laterality Date  . ABDOMINAL AORTOGRAM W/LOWER EXTREMITY N/A 06/05/2019   Procedure: ABDOMINAL AORTOGRAM W/LOWER EXTREMITY;  Surgeon: Elam Dutch, MD;  Location: [facility];  Service: Cardiovascular;  Laterality: N/A;  . BACK SURGERY    . PERIPHERAL VASCULAR BALLOON ANGIOPLASTY Left 06/05/2019   Procedure: PERIPHERAL VASCULAR BALLOON ANGIOPLASTY;  Surgeon: Elam Dutch, MD;  Location: [facility];  Service: Cardiovascular;  Laterality: Left;  Iliac    Current Medications: No outpatient medications have been marked as taking for the 06/09/19 encounter (Appointment) with Donato Heinz, MD.     Allergies:   Cymbalta [duloxetine hcl]   Social History   Socioeconomic History  . Marital status: Married    Spouse name: Not on file  . Number of children: Not on file  . Years of education: Not on file  . Highest education level: Not on file  Occupational History  . Not on file  Social Needs  . Financial resource strain: Not on file  . Food insecurity    Worry: Not on file    Inability: Not on file  . Transportation needs    Medical: Not on file    Non-medical: Not on file  Tobacco Use  . Smoking status: Current Every Day Smoker    Packs/day: 2.00    Types: Cigarettes  . Smokeless tobacco: Never Used  Substance and Sexual Activity  . Alcohol use: Not Currently  . Drug use: Never  . Sexual  activity: Not on file  Lifestyle  . Physical activity    Days per week: Not on file    Minutes per session: Not on file  . Stress: Not on file  Relationships  . Social Herbalist on phone: Not on file    Gets together: Not on file    Attends religious service: Not on file    Active member of club or organization: Not on file    Attends meetings of clubs or organizations: Not on file    Relationship status: Not on file  Other Topics Concern  . Not on file  Social History Narrative  . Not on file     Family History: The patient's ***family history is not on file.  ROS:   Please see the history of present illness.    *** All other systems reviewed and are negative.  EKGs/Labs/Other Studies Reviewed:    The following studies were reviewed today: ***  EKG:  EKG is *** ordered today.  The ekg ordered today demonstrates ***  Recent Labs: 06/05/2019: BUN 10; Creatinine, Ser 1.00; Hemoglobin 15.6; Potassium 4.0; Sodium 140  Recent Lipid Panel No results found for: CHOL, TRIG, HDL, CHOLHDL, VLDL, LDLCALC, LDLDIRECT  Physical Exam:    VS:  There were no vitals taken for this visit.    Wt  Readings from Last 3 Encounters:  06/05/19 237 lb (107.5 kg)  03/26/19 243 lb (110.2 kg)     GEN: *** Well nourished, well developed in no acute distress HEENT: Normal NECK: No JVD; No carotid bruits LYMPHATICS: No lymphadenopathy CARDIAC: ***RRR, no murmurs, rubs, gallops RESPIRATORY:  Clear to auscultation without rales, wheezing or rhonchi  ABDOMEN: Soft, non-tender, non-distended MUSCULOSKELETAL:  No edema; No deformity  SKIN: Warm and dry NEUROLOGIC:  Alert and oriented x 3 PSYCHIATRIC:  Normal affect   ASSESSMENT:    No diagnosis found. PLAN:    In order of problems listed above:  1. ***   Medication Adjustments/Labs and Tests Ordered: Current medicines are reviewed at length with the patient today.  Concerns regarding medicines are outlined above.  No  orders of the defined types were placed in this encounter.  No orders of the defined types were placed in this encounter.   There are no Patient Instructions on file for this visit.   Signed, Little Ishikawa, MD  06/08/2019 11:33 PM    [facility]

## 2019-06-09 ENCOUNTER — Ambulatory Visit: Payer: Medicare Other | Admitting: Cardiology

## 2019-06-23 ENCOUNTER — Encounter: Payer: Self-pay | Admitting: Urology

## 2019-07-07 ENCOUNTER — Other Ambulatory Visit: Payer: Self-pay

## 2019-07-07 ENCOUNTER — Encounter: Payer: Self-pay | Admitting: Cardiovascular Disease

## 2019-07-07 ENCOUNTER — Ambulatory Visit: Payer: Medicare Other | Admitting: Cardiovascular Disease

## 2019-07-07 DIAGNOSIS — Z720 Tobacco use: Secondary | ICD-10-CM | POA: Insufficient documentation

## 2019-07-07 DIAGNOSIS — I714 Abdominal aortic aneurysm, without rupture, unspecified: Secondary | ICD-10-CM | POA: Insufficient documentation

## 2019-07-07 DIAGNOSIS — Z01818 Encounter for other preprocedural examination: Secondary | ICD-10-CM | POA: Diagnosis not present

## 2019-07-07 DIAGNOSIS — E785 Hyperlipidemia, unspecified: Secondary | ICD-10-CM | POA: Insufficient documentation

## 2019-07-07 DIAGNOSIS — R7301 Impaired fasting glucose: Secondary | ICD-10-CM | POA: Diagnosis not present

## 2019-07-07 DIAGNOSIS — I739 Peripheral vascular disease, unspecified: Secondary | ICD-10-CM | POA: Insufficient documentation

## 2019-07-07 DIAGNOSIS — E782 Mixed hyperlipidemia: Secondary | ICD-10-CM

## 2019-07-07 NOTE — Assessment & Plan Note (Signed)
History of PAD with lifestyle and claudication.  Dr. Darrick Penna recently tried to percutaneously recanalize an occluded left iliac artery 06/05/2019 unsuccessfully due to inability to reenter the true lumen.  Scheduled for a femorofemoral crossover graft and was referred here for surgical clearance.

## 2019-07-07 NOTE — Patient Instructions (Signed)
Medication Instructions:  Your physician recommends that you continue on your current medications as directed. Please refer to the Current Medication list given to you today.  If you need a refill on your cardiac medications before your next appointment, please call your pharmacy.   Lab work: Your physician has requested that you have a lexiscan myoview. For further information please visit [email or web address] Please follow instruction sheet, as given.  Testing/Procedures: NONE  Follow-Up: At [facility], you and your health needs are our priority.  As part of our continuing mission to provide you with exceptional heart care, we have created designated Provider Care Teams.  These Care Teams include your primary Cardiologist (physician) and Advanced Practice Providers (APPs -  Physician Assistants and Nurse Practitioners) who all work together to provide you with the care you need, when you need it. You may see Dr Allyson Sabal or one of the following Advanced Practice Providers on your designated Care Team:    Corine Shelter, PA-C  [place], [place]  Edd Fabian, [place]  Your physician wants you to follow-up  As needed.

## 2019-07-07 NOTE — Assessment & Plan Note (Signed)
Greater than 100-pack-year tobacco abuse continue to smoke 2 packs a day recalcitrant to respect modification.

## 2019-07-07 NOTE — Progress Notes (Signed)
07/07/2019 Anthony Todd   [birth year]  [id number]  Primary Physician Benita Stabile, MD Primary Cardiologist: Runell Gess MD Nicholes Calamity, [place]  HPI:  Anthony Todd is a 59 y.o. moderately overweight married Caucasian male father of 2, with no with no grandchildren who has been disabled because of dystonia spasmatic torticollis.  He was referred by Dr. Darrick Penna for preoperative clearance before right to left femorofemoral crossover grafting.  His risk factors include greater than 100 pack years of tobacco abuse continue to smoke 2 packs a day as well as treated hyperlipidemia.  He is never had a heart attack stroke.  He denies chest pain or shortness of breath.  There is no family history for heart disease.  He was found to have a 3 cm infrarenal abdominal like aneurysm on routine abdominal CT imaging because of back pain.  Also on further questioning he has had lifestyle limiting claudication.  He underwent unsuccessful attempt at percutaneous revascularization of an occluded left iliac by Dr. Darrick Penna 06/05/2019 and apparently he needs a right to left femorofemoral crossover graft.  He denies chest pain or shortness of breath.   Current Meds  Medication Sig  . aspirin EC 81 MG tablet Take 81 mg by mouth at bedtime.  Marland Kitchen oxyCODONE-acetaminophen (PERCOCET/ROXICET) 5-325 MG tablet Take 1 tablet by mouth 2 (two) times daily as needed (SEVERE PAIN.).   Marland Kitchen simvastatin (ZOCOR) 20 MG tablet Take 20 mg by mouth at bedtime.  . tamsulosin (FLOMAX) 0.4 MG CAPS capsule Take 0.4 mg by mouth at bedtime.     Allergies  Allergen Reactions  . Cymbalta [Duloxetine Hcl] Rash    Social History   Socioeconomic History  . Marital status: Married    Spouse name: Not on file  . Number of children: Not on file  . Years of education: Not on file  . Highest education level: Not on file  Occupational History  . Not on file  Tobacco Use  . Smoking status: Current Every Day Smoker    Packs/day: 2.00    Types:  Cigarettes  . Smokeless tobacco: Never Used  Substance and Sexual Activity  . Alcohol use: Not Currently  . Drug use: Never  . Sexual activity: Not on file  Other Topics Concern  . Not on file  Social History Narrative  . Not on file   Social Determinants of Health   Financial Resource Strain:   . Difficulty of Paying Living Expenses: Not on file  Food Insecurity:   . Worried About Programme researcher, broadcasting/film/video in the Last Year: Not on file  . Ran Out of Food in the Last Year: Not on file  Transportation Needs:   . Lack of Transportation (Medical): Not on file  . Lack of Transportation (Non-Medical): Not on file  Physical Activity:   . Days of Exercise per Week: Not on file  . Minutes of Exercise per Session: Not on file  Stress:   . Feeling of Stress : Not on file  Social Connections:   . Frequency of Communication with Friends and Family: Not on file  . Frequency of Social Gatherings with Friends and Family: Not on file  . Attends Religious Services: Not on file  . Active Member of Clubs or Organizations: Not on file  . Attends Banker Meetings: Not on file  . Marital Status: Not on file  Intimate Partner Violence:   . Fear of Current or Ex-Partner: Not on file  .  Emotionally Abused: Not on file  . Physically Abused: Not on file  . Sexually Abused: Not on file     Review of Systems: General: negative for chills, fever, night sweats or weight changes.  Cardiovascular: negative for chest pain, dyspnea on exertion, edema, orthopnea, palpitations, paroxysmal nocturnal dyspnea or shortness of breath Dermatological: negative for rash Respiratory: negative for cough or wheezing Urologic: negative for hematuria Abdominal: negative for nausea, vomiting, diarrhea, bright red blood per rectum, melena, or hematemesis Neurologic: negative for visual changes, syncope, or dizziness All other systems reviewed and are otherwise negative except as noted above.    Blood pressure  (!) 179/98, pulse 82, temperature 98.2 F (36.8 C), height 5\' 9"  (1.753 m), weight 243 lb 12.8 oz (110.6 kg), SpO2 96 %.  General appearance: alert and no distress Neck: no adenopathy, no carotid bruit, no JVD, supple, symmetrical, trachea midline and thyroid not enlarged, symmetric, no tenderness/mass/nodules Lungs: clear to auscultation bilaterally Heart: regular rate and rhythm, S1, S2 normal, no murmur, click, rub or gallop Extremities: extremities normal, atraumatic, no cyanosis or edema Pulses: Diminished pedal pulses Skin: Skin color, texture, turgor normal. No rashes or lesions Neurologic: Alert and oriented X 3, normal strength and tone. Normal symmetric reflexes. Normal coordination and gait  EKG not performed today  ASSESSMENT AND PLAN:   AAA (abdominal aortic aneurysm) without rupture (HCC) Recent abdominal CT showed a 3 cm abdominal aortic aneurysm being followed by Dr. Oneida Alar  Peripheral arterial disease [facility]) History of PAD with lifestyle and claudication.  Dr. Oneida Alar recently tried to percutaneously recanalize an occluded left iliac artery 06/05/2019 unsuccessfully due to inability to reenter the true lumen.  Scheduled for a femorofemoral crossover graft and was referred here for surgical clearance.  Tobacco abuse Greater than 100-pack-year tobacco abuse continue to smoke 2 packs a day recalcitrant to respect modification.  Hyperlipidemia History of hyperlipidemia on statin therapy lipid profile performed 03/26/2019 revealing a total cholesterol of 166.  Preoperative clearance Patient needs a femorofemoral crossover graft.  He has a greater than 100-pack-year history tobacco abuse, treated hyperlipidemia.  Go to get a Lexiscan Myoview stress test to her stratify      Lorretta Harp MD [facility], [facility] 07/07/2019 4:29 PM

## 2019-07-07 NOTE — Assessment & Plan Note (Signed)
Recent abdominal CT showed a 3 cm abdominal aortic aneurysm being followed by Dr. Darrick Penna

## 2019-07-07 NOTE — Assessment & Plan Note (Signed)
History of hyperlipidemia on statin therapy lipid profile performed 03/26/2019 revealing a total cholesterol of 166.

## 2019-07-07 NOTE — Assessment & Plan Note (Signed)
Patient needs a femorofemoral crossover graft.  He has a greater than 100-pack-year history tobacco abuse, treated hyperlipidemia.  Go to get a Lexiscan Myoview stress test to her stratify

## 2019-07-15 ENCOUNTER — Telehealth (HOSPITAL_COMMUNITY): Payer: Self-pay

## 2019-07-15 ENCOUNTER — Encounter: Payer: Self-pay | Admitting: Cardiovascular Disease

## 2019-07-15 ENCOUNTER — Encounter: Payer: Self-pay | Admitting: Cardiology

## 2019-07-15 NOTE — Telephone Encounter (Signed)
Encounter complete. 

## 2019-07-15 NOTE — Telephone Encounter (Signed)
Pt called wanting to know more about his test he has on 1/15. Gave pt website [email or web address] and advised him to refer to that for questions regarding his test.

## 2019-07-15 NOTE — Telephone Encounter (Signed)
error 

## 2019-07-15 NOTE — Telephone Encounter (Signed)
Patient is returning call in regards to appointment on 07/17/19 at 1:15 PM. Please call.

## 2019-07-15 NOTE — Telephone Encounter (Signed)
LM2CB regarding concerns with appt on 07/17/19

## 2019-07-15 NOTE — Telephone Encounter (Signed)
ERROR

## 2019-07-16 ENCOUNTER — Telehealth (HOSPITAL_COMMUNITY): Payer: Self-pay

## 2019-07-16 NOTE — Telephone Encounter (Signed)
Encounter complete. 

## 2019-07-17 ENCOUNTER — Other Ambulatory Visit: Payer: Self-pay

## 2019-07-17 ENCOUNTER — Ambulatory Visit (HOSPITAL_COMMUNITY)
Admission: RE | Admit: 2019-07-17 | Discharge: 2019-07-17 | Disposition: A | Discharge status: Home / Self Care | Payer: Medicare Other | Source: Ambulatory Visit | Attending: Cardiovascular Disease | Admitting: Cardiovascular Disease

## 2019-07-17 DIAGNOSIS — E785 Hyperlipidemia, unspecified: Secondary | ICD-10-CM | POA: Insufficient documentation

## 2019-07-17 DIAGNOSIS — I252 Old myocardial infarction: Secondary | ICD-10-CM | POA: Diagnosis not present

## 2019-07-17 DIAGNOSIS — I714 Abdominal aortic aneurysm, without rupture: Secondary | ICD-10-CM | POA: Insufficient documentation

## 2019-07-17 DIAGNOSIS — Z01810 Encounter for preprocedural cardiovascular examination: Secondary | ICD-10-CM | POA: Diagnosis not present

## 2019-07-17 DIAGNOSIS — F17200 Nicotine dependence, unspecified, uncomplicated: Secondary | ICD-10-CM | POA: Insufficient documentation

## 2019-07-17 DIAGNOSIS — Z01818 Encounter for other preprocedural examination: Secondary | ICD-10-CM | POA: Diagnosis not present

## 2019-07-17 LAB — MYOCARDIAL PERFUSION IMAGING
LV dias vol: 103 mL (ref 62–150)
LV sys vol: 39 mL
Peak HR: 101 {beats}/min
Rest HR: 75 {beats}/min
SDS: 1
SRS: 1
SSS: 2
TID: 1.08

## 2019-07-17 MED ORDER — REGADENOSON 0.4 MG/5ML IV SOLN
0.4000 mg | Freq: Once | INTRAVENOUS | Status: AC
  Administered 2019-07-17: 0.4 mg via INTRAVENOUS

## 2019-07-17 MED ORDER — TECHNETIUM TC 99M TETROFOSMIN IV KIT
32.1000 | PACK | Freq: Once | INTRAVENOUS | Status: AC | PRN
  Administered 2019-07-17: 32.1 via INTRAVENOUS
  Filled 2019-07-17: qty 33

## 2019-07-17 MED ORDER — TECHNETIUM TC 99M TETROFOSMIN IV KIT
10.5000 | PACK | Freq: Once | INTRAVENOUS | Status: AC | PRN
  Administered 2019-07-17: 10.5 via INTRAVENOUS
  Filled 2019-07-17: qty 11

## 2019-07-21 ENCOUNTER — Telehealth: Payer: Self-pay | Admitting: Cardiovascular Disease

## 2019-07-21 NOTE — Telephone Encounter (Signed)
New Message  Patient is returning call. Please give patient a call back and if there is no answer, please leave results on vm per patient.

## 2019-07-21 NOTE — Telephone Encounter (Signed)
Gave pt verbal results for stress test. Verbalized understanding.

## 2019-07-21 NOTE — Telephone Encounter (Signed)
Follow Up:      Pt is returning Kayla's call, concerning his Stress test results.r

## 2019-08-27 ENCOUNTER — Ambulatory Visit: Payer: Medicare Other | Admitting: Vascular Surgery

## 2019-08-27 ENCOUNTER — Encounter: Payer: Self-pay | Admitting: Vascular Surgery

## 2019-08-27 ENCOUNTER — Other Ambulatory Visit: Payer: Self-pay

## 2019-08-27 VITALS — BP 168/98 | HR 85 | Temp 98.0°F | Resp 20 | Ht 69.0 in | Wt 239.9 lb

## 2019-08-27 DIAGNOSIS — I739 Peripheral vascular disease, unspecified: Secondary | ICD-10-CM

## 2019-08-27 NOTE — Progress Notes (Signed)
Patient is a 58-year-old male who underwent attempted recanalization of the left common iliac artery for a chronic left iliac occlusion.  He is here to discuss whether or not to undergo right to left femoral-femoral bypass.  He still has symptoms of left leg weakness which occurs with ambulation.  He also has some problems in the right leg and is being evaluated for a potential lumbar spine operation.  He is on aspirin and a statin.  Of note he also has erectile dysfunction.  I discussed with him today that this is most likely related to his cigarette smoking and iliac artery occlusive disease.  I did discuss with him that an operation would not improve this.  He apparently had a negative cardiac stress test in January.  He does have a small abdominal aortic aneurysm at 3 cm diameter.  Will need repeat duplex of this in 2 years.  Review of systems: He has shortness of breath with exertion.  He does not have chest pain.  Current Outpatient Medications on File Prior to Visit  Medication Sig Dispense Refill  . aspirin EC 81 MG tablet Take 81 mg by mouth at bedtime.    . oxyCODONE-acetaminophen (PERCOCET/ROXICET) 5-325 MG tablet Take 1 tablet by mouth 2 (two) times daily as needed (SEVERE PAIN.).     . simvastatin (ZOCOR) 20 MG tablet Take 20 mg by mouth at bedtime.    . tamsulosin (FLOMAX) 0.4 MG CAPS capsule Take 0.4 mg by mouth at bedtime.     No current facility-administered medications on file prior to visit.   Physical exam:  Vitals:   08/27/19 0941  BP: (!) 168/98  Pulse: 85  Resp: 20  Temp: 98 F (36.7 C)  SpO2: 94%  Weight: 239 lb 14.4 oz (108.8 kg)  Height: 5' 9" (1.753 m)    Extremities: 2+ brachial and radial pulse, 2+ right femoral absent left femoral pulse 2+ posterior tibial pulse right foot absent right dorsalis pedis pulse absent pedal pulses left foot  Skin: No open ulcer or wound  Data: I reviewed the patient's recent angiogram images with the patient in the room and  discussed with him the left common iliac occlusion and the possibility of a right to left femoral-femoral bypass.  Assessment: Peripheral arterial disease with left common iliac occlusion.  Erectile dysfunction most likely secondary to peripheral arterial disease.  Left leg claudication secondary to left common iliac occlusion.  Plan: Patient is scheduled for a left to right femoral-femoral bypass on 09/07/2019.  Risk benefits possible complications and procedure details including not limited to bleeding infection graft thrombosis all discussed with patient fairly low risk overall.  Also risk of anesthetic or cardiac complications.  Also did discuss with the patient today that he is not currently at risk of limb loss.  He has risk of limb loss lifetime would be less than 5% if he is able to quit smoking.  However, he would be at risk of amputation potentially down the road if he continues to smoke.  He understands this and will try to quit smoking.  He will need a nicotine patch while he is in the hospital.  He will discuss with his primary care physician potentially starting Chantix but wanted to wait until his back operation and femoral-femoral bypass were complete first  Patient needs follow-up ultrasound of his abdominal aorta in 18 months for 3 cm abdominal aortic aneurysm  Charles Fields, MD [facility] Office: [phone]  

## 2019-08-27 NOTE — H&P (View-Only) (Signed)
Patient is a 59 year old male who underwent attempted recanalization of the left common iliac artery for a chronic left iliac occlusion.  He is here to discuss whether or not to undergo right to left femoral-femoral bypass.  He still has symptoms of left leg weakness which occurs with ambulation.  He also has some problems in the right leg and is being evaluated for a potential lumbar spine operation.  He is on aspirin and a statin.  Of note he also has erectile dysfunction.  I discussed with him today that this is most likely related to his cigarette smoking and iliac artery occlusive disease.  I did discuss with him that an operation would not improve this.  He apparently had a negative cardiac stress test in January.  He does have a small abdominal aortic aneurysm at 3 cm diameter.  Will need repeat duplex of this in 2 years.  Review of systems: He has shortness of breath with exertion.  He does not have chest pain.  Current Outpatient Medications on File Prior to Visit  Medication Sig Dispense Refill  . aspirin EC 81 MG tablet Take 81 mg by mouth at bedtime.    Marland Kitchen oxyCODONE-acetaminophen (PERCOCET/ROXICET) 5-325 MG tablet Take 1 tablet by mouth 2 (two) times daily as needed (SEVERE PAIN.).     Marland Kitchen simvastatin (ZOCOR) 20 MG tablet Take 20 mg by mouth at bedtime.    . tamsulosin (FLOMAX) 0.4 MG CAPS capsule Take 0.4 mg by mouth at bedtime.     No current facility-administered medications on file prior to visit.   Physical exam:  Vitals:   08/27/19 0941  BP: (!) 168/98  Pulse: 85  Resp: 20  Temp: 98 F (36.7 C)  SpO2: 94%  Weight: 239 lb 14.4 oz (108.8 kg)  Height: 5\' 9"  (1.753 m)    Extremities: 2+ brachial and radial pulse, 2+ right femoral absent left femoral pulse 2+ posterior tibial pulse right foot absent right dorsalis pedis pulse absent pedal pulses left foot  Skin: No open ulcer or wound  Data: I reviewed the patient's recent angiogram images with the patient in the room and  discussed with him the left common iliac occlusion and the possibility of a right to left femoral-femoral bypass.  Assessment: Peripheral arterial disease with left common iliac occlusion.  Erectile dysfunction most likely secondary to peripheral arterial disease.  Left leg claudication secondary to left common iliac occlusion.  Plan: Patient is scheduled for a left to right femoral-femoral bypass on 09/07/2019.  Risk benefits possible complications and procedure details including not limited to bleeding infection graft thrombosis all discussed with patient fairly low risk overall.  Also risk of anesthetic or cardiac complications.  Also did discuss with the patient today that he is not currently at risk of limb loss.  He has risk of limb loss lifetime would be less than 5% if he is able to quit smoking.  However, he would be at risk of amputation potentially down the road if he continues to smoke.  He understands this and will try to quit smoking.  He will need a nicotine patch while he is in the hospital.  He will discuss with his primary care physician potentially starting Chantix but wanted to wait until his back operation and femoral-femoral bypass were complete first  Patient needs follow-up ultrasound of his abdominal aorta in 18 months for 3 cm abdominal aortic aneurysm  11/07/2019, MD [facility] Office: [phone]

## 2019-08-31 DIAGNOSIS — I739 Peripheral vascular disease, unspecified: Secondary | ICD-10-CM | POA: Diagnosis not present

## 2019-09-01 ENCOUNTER — Other Ambulatory Visit: Payer: Self-pay

## 2019-09-08 NOTE — Pre-Procedure Instructions (Signed)
[facility] [phone] - [place], [place] - [address] AT [facility] OF [address] & [address]  [place] [address] Phone: [phone] Fax: [phone]      Your procedure is scheduled on Monday, March 15th.  Report to [facility] Main Entrance "A" at 6:30 A.M., and check in at the Admitting office.  Call this number if you have problems the morning of surgery:  [phone]  Call [phone] if you have any questions prior to your surgery date Monday-Friday 8am-4pm    Remember:  Do not eat or drink after midnight the night before your surgery    Take these medicines the morning of surgery with A SIP OF WATER  Aspirin oxyCODONE-acetaminophen (PERCOCET/ROXICET)-as needed.   As of today, STOP taking any Aspirin (unless otherwise instructed by your surgeon), Aleve, Naproxen, Ibuprofen, Motrin, Advil, Goody's, BC's, all herbal medications, fish oil, and all vitamins.    The Morning of Surgery  Do not wear jewelry.  Do not wear lotions, powders, or colognes, or deodorant  Men may shave face and neck.  Do not bring valuables to the hospital.  [facility] is not responsible for any belongings or valuables.  If you are a smoker, DO NOT Smoke 24 hours prior to surgery  If you wear a CPAP at night please bring your mask the morning of surgery   Remember that you must have someone to transport you home after your surgery, and remain with you for 24 hours if you are discharged the same day.  Please bring cases for contacts, glasses, hearing aids, dentures or bridgework because it cannot be worn into surgery.   Leave your suitcase in the car.  After surgery it may be brought to your room.  For patients admitted to the hospital, discharge time will be determined by your treatment team.  Patients discharged the day of surgery will not be allowed to drive home.    Special instructions:   [place]- Preparing For Surgery  Before surgery, you can play an  important role. Because skin is not sterile, your skin needs to be as free of germs as possible. You can reduce the number of germs on your skin by washing with CHG (chlorahexidine gluconate) Soap before surgery.  CHG is an antiseptic cleaner which kills germs and bonds with the skin to continue killing germs even after washing.    Oral Hygiene is also important to reduce your risk of infection.  Remember - BRUSH YOUR TEETH THE MORNING OF SURGERY WITH YOUR REGULAR TOOTHPASTE  Please do not use if you have an allergy to CHG or antibacterial soaps. If your skin becomes reddened/irritated stop using the CHG.  Do not shave (including legs and underarms) for at least 48 hours prior to first CHG shower. It is OK to shave your face.  Please follow these instructions carefully.   1. Shower the NIGHT BEFORE SURGERY and the MORNING OF SURGERY with CHG Soap.   2. If you chose to wash your hair, wash your hair first as usual with your normal shampoo.  3. After you shampoo, rinse your hair and body thoroughly to remove the shampoo.  4. Use CHG as you would any other liquid soap. You can apply CHG directly to the skin and wash gently with a scrungie or a clean washcloth.   5. Apply the CHG Soap to your body ONLY FROM THE NECK DOWN.  Do not use on open wounds or open sores. Avoid contact with your  eyes, ears, mouth and genitals (private parts). Wash Face and genitals (private parts)  with your normal soap.   6. Wash thoroughly, paying special attention to the area where your surgery will be performed.  7. Thoroughly rinse your body with warm water from the neck down.  8. DO NOT shower/wash with your normal soap after using and rinsing off the CHG Soap.  9. Pat yourself dry with a CLEAN TOWEL.  10. Wear CLEAN PAJAMAS to bed the night before surgery, wear comfortable clothes the morning of surgery  11. Place CLEAN SHEETS on your bed the night of your first shower and DO NOT SLEEP WITH PETS.    Day of  Surgery:  Please shower the morning of surgery with the CHG soap Do not apply any deodorants/lotions. Please wear clean clothes to the hospital/surgery center.   Remember to brush your teeth WITH YOUR REGULAR TOOTHPASTE.   Please read over the following fact sheets that you were given.

## 2019-09-09 ENCOUNTER — Telehealth: Payer: Self-pay

## 2019-09-09 ENCOUNTER — Encounter (HOSPITAL_COMMUNITY)
Admission: RE | Admit: 2019-09-09 | Discharge: 2019-09-09 | Disposition: A | Discharge status: Home / Self Care | Payer: Medicare Other | Source: Ambulatory Visit | Attending: Vascular Surgery | Admitting: Vascular Surgery

## 2019-09-09 ENCOUNTER — Other Ambulatory Visit: Payer: Self-pay

## 2019-09-09 ENCOUNTER — Encounter (HOSPITAL_COMMUNITY): Payer: Self-pay

## 2019-09-09 DIAGNOSIS — Z79899 Other long term (current) drug therapy: Secondary | ICD-10-CM | POA: Insufficient documentation

## 2019-09-09 DIAGNOSIS — Z7982 Long term (current) use of aspirin: Secondary | ICD-10-CM | POA: Insufficient documentation

## 2019-09-09 DIAGNOSIS — I739 Peripheral vascular disease, unspecified: Secondary | ICD-10-CM | POA: Diagnosis not present

## 2019-09-09 DIAGNOSIS — Z01812 Encounter for preprocedural laboratory examination: Secondary | ICD-10-CM | POA: Insufficient documentation

## 2019-09-09 DIAGNOSIS — E785 Hyperlipidemia, unspecified: Secondary | ICD-10-CM | POA: Diagnosis not present

## 2019-09-09 LAB — COMPREHENSIVE METABOLIC PANEL
ALT: 52 U/L — ABNORMAL HIGH (ref 0–44)
AST: 24 U/L (ref 15–41)
Albumin: 4.2 g/dL (ref 3.5–5.0)
Alkaline Phosphatase: 63 U/L (ref 38–126)
Anion gap: 9 (ref 5–15)
BUN: 9 mg/dL (ref 6–20)
CO2: 24 mmol/L (ref 22–32)
Calcium: 9.8 mg/dL (ref 8.9–10.3)
Chloride: 105 mmol/L (ref 98–111)
Creatinine, Ser: 1.08 mg/dL (ref 0.61–1.24)
GFR calc Af Amer: >60 mL/min (ref >60)
GFR calc non Af Amer: >60 mL/min (ref >60)
Glucose, Bld: 108 mg/dL — ABNORMAL HIGH (ref 70–99)
Potassium: 4.3 mmol/L (ref 3.5–5.1)
Sodium: 138 mmol/L (ref 135–145)
Total Bilirubin: 0.6 mg/dL (ref 0.3–1.2)
Total Protein: 7.6 g/dL (ref 6.5–8.1)

## 2019-09-09 LAB — CBC
HCT: 50.2 % (ref 39.0–52.0)
Hemoglobin: 17.5 g/dL — ABNORMAL HIGH (ref 13.0–17.0)
MCH: 30.4 pg (ref 26.0–34.0)
MCHC: 34.9 g/dL (ref 30.0–36.0)
MCV: 87.2 fL (ref 80.0–100.0)
Platelets: 187 10*3/uL (ref 150–400)
RBC: 5.76 MIL/uL (ref 4.22–5.81)
RDW: 12.9 % (ref 11.5–15.5)
WBC: 10.1 10*3/uL (ref 4.0–10.5)
nRBC: 0 % (ref 0.0–0.2)

## 2019-09-09 LAB — URINALYSIS, ROUTINE W REFLEX MICROSCOPIC
Bilirubin Urine: NEGATIVE
Glucose, UA: NEGATIVE mg/dL
Hgb urine dipstick: NEGATIVE
Ketones, ur: NEGATIVE mg/dL
Leukocytes,Ua: NEGATIVE
Nitrite: NEGATIVE
Protein, ur: NEGATIVE mg/dL
Specific Gravity, Urine: 1.01 (ref 1.005–1.030)
pH: 5 (ref 5.0–8.0)

## 2019-09-09 LAB — TYPE AND SCREEN
ABO/RH(D): A POS
Antibody Screen: NEGATIVE

## 2019-09-09 LAB — PROTIME-INR
INR: 0.9 (ref 0.8–1.2)
Prothrombin Time: 12.5 seconds (ref 11.4–15.2)

## 2019-09-09 LAB — SURGICAL PCR SCREEN
MRSA, PCR: NEGATIVE
Staphylococcus aureus: NEGATIVE

## 2019-09-09 LAB — ABO/RH: ABO/RH(D): A POS

## 2019-09-09 LAB — APTT: aPTT: 34 seconds (ref 24–36)

## 2019-09-09 NOTE — Telephone Encounter (Signed)
Left vm for pt regarding arrival time for surgery Monday 3/15.

## 2019-09-09 NOTE — Progress Notes (Signed)
PCP:  Nita Sells, MD Cardiologist:  Nanetta Batty, MD  EKG:  12/4/20CXR: ECHO:  Denies Stress Test:  07/17/19 Cardiac Cath:  Denies  covid test 09/11/19  Patient denies shortness of breath, fever, cough, and chest pain at PAT appointment.  Patient verbalized understanding of instructions provided today at the PAT appointment.  Patient asked to review instructions at home and day of surgery.

## 2019-09-11 ENCOUNTER — Other Ambulatory Visit (HOSPITAL_COMMUNITY)
Admission: RE | Admit: 2019-09-11 | Discharge: 2019-09-11 | Disposition: A | Discharge status: Home / Self Care | Payer: Medicare Other | Source: Ambulatory Visit | Attending: Vascular Surgery | Admitting: Vascular Surgery

## 2019-09-11 ENCOUNTER — Other Ambulatory Visit (HOSPITAL_COMMUNITY): Payer: Medicare Other

## 2019-09-11 ENCOUNTER — Other Ambulatory Visit: Payer: Self-pay

## 2019-09-11 DIAGNOSIS — I714 Abdominal aortic aneurysm, without rupture: Secondary | ICD-10-CM | POA: Diagnosis not present

## 2019-09-11 DIAGNOSIS — I708 Atherosclerosis of other arteries: Secondary | ICD-10-CM | POA: Diagnosis not present

## 2019-09-11 DIAGNOSIS — I771 Stricture of artery: Secondary | ICD-10-CM | POA: Diagnosis not present

## 2019-09-11 DIAGNOSIS — Z20822 Contact with and (suspected) exposure to covid-19: Secondary | ICD-10-CM | POA: Diagnosis not present

## 2019-09-11 DIAGNOSIS — Z79891 Long term (current) use of opiate analgesic: Secondary | ICD-10-CM | POA: Diagnosis not present

## 2019-09-11 DIAGNOSIS — E785 Hyperlipidemia, unspecified: Secondary | ICD-10-CM | POA: Diagnosis not present

## 2019-09-11 DIAGNOSIS — M549 Dorsalgia, unspecified: Secondary | ICD-10-CM | POA: Diagnosis not present

## 2019-09-11 DIAGNOSIS — Z87891 Personal history of nicotine dependence: Secondary | ICD-10-CM | POA: Diagnosis not present

## 2019-09-11 DIAGNOSIS — Z7982 Long term (current) use of aspirin: Secondary | ICD-10-CM | POA: Diagnosis not present

## 2019-09-11 DIAGNOSIS — I739 Peripheral vascular disease, unspecified: Secondary | ICD-10-CM | POA: Diagnosis not present

## 2019-09-11 DIAGNOSIS — I745 Embolism and thrombosis of iliac artery: Secondary | ICD-10-CM | POA: Diagnosis not present

## 2019-09-11 DIAGNOSIS — Z79899 Other long term (current) drug therapy: Secondary | ICD-10-CM | POA: Diagnosis not present

## 2019-09-12 LAB — SARS CORONAVIRUS 2 (TAT 6-24 HRS): SARS Coronavirus 2: NEGATIVE

## 2019-09-14 ENCOUNTER — Inpatient Hospital Stay (HOSPITAL_COMMUNITY): Payer: Medicare Other | Admitting: Certified Registered Nurse Anesthetist

## 2019-09-14 ENCOUNTER — Other Ambulatory Visit: Payer: Self-pay

## 2019-09-14 ENCOUNTER — Encounter (HOSPITAL_COMMUNITY): Payer: Self-pay | Admitting: Vascular Surgery

## 2019-09-14 ENCOUNTER — Inpatient Hospital Stay (HOSPITAL_COMMUNITY)
Admission: RE | Admit: 2019-09-14 | Discharge: 2019-09-16 | DRG: 253 | Disposition: A | Discharge status: Home / Self Care | Payer: Medicare Other | Attending: Vascular Surgery | Admitting: Vascular Surgery

## 2019-09-14 ENCOUNTER — Encounter (HOSPITAL_COMMUNITY)
Admission: RE | Disposition: A | Discharge status: Home / Self Care | Payer: Self-pay | Source: Home / Self Care | Attending: Vascular Surgery

## 2019-09-14 DIAGNOSIS — N529 Male erectile dysfunction, unspecified: Secondary | ICD-10-CM | POA: Diagnosis present

## 2019-09-14 DIAGNOSIS — Z79891 Long term (current) use of opiate analgesic: Secondary | ICD-10-CM

## 2019-09-14 DIAGNOSIS — I739 Peripheral vascular disease, unspecified: Secondary | ICD-10-CM | POA: Diagnosis not present

## 2019-09-14 DIAGNOSIS — E785 Hyperlipidemia, unspecified: Secondary | ICD-10-CM | POA: Diagnosis not present

## 2019-09-14 DIAGNOSIS — I708 Atherosclerosis of other arteries: Secondary | ICD-10-CM | POA: Diagnosis present

## 2019-09-14 DIAGNOSIS — I70211 Atherosclerosis of native arteries of extremities with intermittent claudication, right leg: Secondary | ICD-10-CM | POA: Diagnosis not present

## 2019-09-14 DIAGNOSIS — I771 Stricture of artery: Secondary | ICD-10-CM | POA: Diagnosis not present

## 2019-09-14 DIAGNOSIS — Z7982 Long term (current) use of aspirin: Secondary | ICD-10-CM | POA: Diagnosis not present

## 2019-09-14 DIAGNOSIS — Z79899 Other long term (current) drug therapy: Secondary | ICD-10-CM

## 2019-09-14 DIAGNOSIS — I714 Abdominal aortic aneurysm, without rupture: Secondary | ICD-10-CM | POA: Diagnosis present

## 2019-09-14 DIAGNOSIS — M549 Dorsalgia, unspecified: Secondary | ICD-10-CM | POA: Diagnosis not present

## 2019-09-14 DIAGNOSIS — I70212 Atherosclerosis of native arteries of extremities with intermittent claudication, left leg: Secondary | ICD-10-CM | POA: Diagnosis not present

## 2019-09-14 DIAGNOSIS — Z87891 Personal history of nicotine dependence: Secondary | ICD-10-CM

## 2019-09-14 DIAGNOSIS — Z20822 Contact with and (suspected) exposure to covid-19: Secondary | ICD-10-CM | POA: Diagnosis not present

## 2019-09-14 DIAGNOSIS — I745 Embolism and thrombosis of iliac artery: Secondary | ICD-10-CM | POA: Diagnosis present

## 2019-09-14 HISTORY — PX: FEMORAL-FEMORAL BYPASS GRAFT: SHX936

## 2019-09-14 HISTORY — DX: Peripheral vascular disease, unspecified: I73.9

## 2019-09-14 HISTORY — PX: FEMORAL ARTERY - FEMORAL ARTERY BYPASS GRAFT: SUR179

## 2019-09-14 HISTORY — DX: Stricture of artery: I77.1

## 2019-09-14 SURGERY — CREATION, BYPASS, ARTERIAL, FEMORAL TO FEMORAL, USING GRAFT
Anesthesia: General | Laterality: Bilateral

## 2019-09-14 MED ORDER — HYDROMORPHONE HCL 1 MG/ML IJ SOLN: 0.2500 mg | INTRAMUSCULAR | Status: DC | PRN

## 2019-09-14 MED ORDER — CEFAZOLIN SODIUM-DEXTROSE 2-4 GM/100ML-% IV SOLN
2.0000 g | Freq: Three times a day (TID) | INTRAVENOUS | Status: AC
  Administered 2019-09-14: 18:00:00 2 g via INTRAVENOUS
  Filled 2019-09-14: qty 100

## 2019-09-14 MED ORDER — HYDROMORPHONE HCL 1 MG/ML IJ SOLN
INTRAMUSCULAR | Status: AC
  Filled 2019-09-14: qty 1

## 2019-09-14 MED ORDER — HEPARIN SODIUM (PORCINE) 1000 UNIT/ML IJ SOLN
INTRAMUSCULAR | Status: AC
  Filled 2019-09-14: qty 1

## 2019-09-14 MED ORDER — CEFAZOLIN SODIUM-DEXTROSE 2-4 GM/100ML-% IV SOLN
INTRAVENOUS | Status: AC
  Filled 2019-09-14: qty 100

## 2019-09-14 MED ORDER — GUAIFENESIN-DM 100-10 MG/5ML PO SYRP: 15.0000 mL | ORAL_SOLUTION | ORAL | Status: DC | PRN

## 2019-09-14 MED ORDER — SUGAMMADEX SODIUM 200 MG/2ML IV SOLN
INTRAVENOUS | Status: DC | PRN
  Administered 2019-09-14: 200 mg via INTRAVENOUS

## 2019-09-14 MED ORDER — EPHEDRINE SULFATE-NACL 50-0.9 MG/10ML-% IV SOSY
PREFILLED_SYRINGE | INTRAVENOUS | Status: DC | PRN
  Administered 2019-09-14: 10 mg via INTRAVENOUS
  Administered 2019-09-14: 5 mg via INTRAVENOUS

## 2019-09-14 MED ORDER — ACETAMINOPHEN 325 MG RE SUPP: 325.0000 mg | RECTAL | Status: DC | PRN

## 2019-09-14 MED ORDER — POLYETHYLENE GLYCOL 3350 17 G PO PACK: 17.0000 g | PACK | Freq: Every day | ORAL | Status: DC | PRN

## 2019-09-14 MED ORDER — ACETAMINOPHEN 500 MG PO TABS
1000.0000 mg | ORAL_TABLET | Freq: Once | ORAL | Status: AC
  Administered 2019-09-14: 1000 mg via ORAL
  Filled 2019-09-14: qty 2

## 2019-09-14 MED ORDER — 0.9 % SODIUM CHLORIDE (POUR BTL) OPTIME
TOPICAL | Status: DC | PRN
  Administered 2019-09-14: 2000 mL

## 2019-09-14 MED ORDER — SIMVASTATIN 20 MG PO TABS
20.0000 mg | ORAL_TABLET | Freq: Every day | ORAL | Status: DC
  Administered 2019-09-14 – 2019-09-15 (×2): 20 mg via ORAL
  Filled 2019-09-14 (×2): qty 1

## 2019-09-14 MED ORDER — EPHEDRINE 5 MG/ML INJ
INTRAVENOUS | Status: AC
  Filled 2019-09-14: qty 10

## 2019-09-14 MED ORDER — KCL IN DEXTROSE-NACL 20-5-0.45 MEQ/L-%-% IV SOLN: INTRAVENOUS | Status: DC

## 2019-09-14 MED ORDER — NICOTINE 21 MG/24HR TD PT24
21.0000 mg | MEDICATED_PATCH | Freq: Every day | TRANSDERMAL | Status: DC
  Administered 2019-09-14: 21 mg via TRANSDERMAL
  Filled 2019-09-14 (×2): qty 1

## 2019-09-14 MED ORDER — HEPARIN SODIUM (PORCINE) 1000 UNIT/ML IJ SOLN
INTRAMUSCULAR | Status: DC | PRN
  Administered 2019-09-14: 10000 [IU] via INTRAVENOUS

## 2019-09-14 MED ORDER — LIDOCAINE 2% (20 MG/ML) 5 ML SYRINGE
INTRAMUSCULAR | Status: DC | PRN
  Administered 2019-09-14: 60 mg via INTRAVENOUS

## 2019-09-14 MED ORDER — LACTATED RINGERS IV SOLN: INTRAVENOUS | Status: DC

## 2019-09-14 MED ORDER — MAGNESIUM SULFATE 2 GM/50ML IV SOLN: 2.0000 g | Freq: Every day | INTRAVENOUS | Status: DC | PRN

## 2019-09-14 MED ORDER — FENTANYL CITRATE (PF) 250 MCG/5ML IJ SOLN
INTRAMUSCULAR | Status: AC
  Filled 2019-09-14: qty 5

## 2019-09-14 MED ORDER — METOPROLOL TARTRATE 5 MG/5ML IV SOLN: 2.0000 mg | INTRAVENOUS | Status: DC | PRN

## 2019-09-14 MED ORDER — LACTATED RINGERS IV SOLN: INTRAVENOUS | Status: DC | PRN

## 2019-09-14 MED ORDER — ALUM & MAG HYDROXIDE-SIMETH 200-200-20 MG/5ML PO SUSP: 15.0000 mL | ORAL | Status: DC | PRN

## 2019-09-14 MED ORDER — TAMSULOSIN HCL 0.4 MG PO CAPS
0.4000 mg | ORAL_CAPSULE | Freq: Every day | ORAL | Status: DC
  Administered 2019-09-14 – 2019-09-15 (×2): 0.4 mg via ORAL
  Filled 2019-09-14 (×2): qty 1

## 2019-09-14 MED ORDER — ALBUTEROL SULFATE HFA 108 (90 BASE) MCG/ACT IN AERS
INHALATION_SPRAY | RESPIRATORY_TRACT | Status: DC | PRN
  Administered 2019-09-14: 3 via RESPIRATORY_TRACT

## 2019-09-14 MED ORDER — ACETAMINOPHEN 325 MG PO TABS
325.0000 mg | ORAL_TABLET | ORAL | Status: DC | PRN
  Administered 2019-09-15: 650 mg via ORAL
  Filled 2019-09-14: qty 2

## 2019-09-14 MED ORDER — SODIUM CHLORIDE 0.9 % IV SOLN
INTRAVENOUS | Status: AC
  Filled 2019-09-14: qty 1.2

## 2019-09-14 MED ORDER — ONDANSETRON HCL 4 MG/2ML IJ SOLN
4.0000 mg | Freq: Four times a day (QID) | INTRAMUSCULAR | Status: DC | PRN
  Administered 2019-09-15: 4 mg via INTRAVENOUS
  Filled 2019-09-14: qty 2

## 2019-09-14 MED ORDER — ALBUTEROL SULFATE HFA 108 (90 BASE) MCG/ACT IN AERS
INHALATION_SPRAY | RESPIRATORY_TRACT | Status: AC
  Filled 2019-09-14: qty 6.7

## 2019-09-14 MED ORDER — PHENYLEPHRINE HCL-NACL 10-0.9 MG/250ML-% IV SOLN
INTRAVENOUS | Status: DC | PRN
  Administered 2019-09-14: 20 ug/min via INTRAVENOUS

## 2019-09-14 MED ORDER — SODIUM CHLORIDE 0.9 % IV SOLN
INTRAVENOUS | Status: DC | PRN
  Administered 2019-09-14: 500 mL

## 2019-09-14 MED ORDER — FENTANYL CITRATE (PF) 250 MCG/5ML IJ SOLN
INTRAMUSCULAR | Status: DC | PRN
  Administered 2019-09-14: 100 ug via INTRAVENOUS
  Administered 2019-09-14 (×4): 50 ug via INTRAVENOUS
  Administered 2019-09-14 (×2): 25 ug via INTRAVENOUS
  Administered 2019-09-14: 50 ug via INTRAVENOUS

## 2019-09-14 MED ORDER — OXYCODONE-ACETAMINOPHEN 5-325 MG PO TABS
1.0000 | ORAL_TABLET | Freq: Two times a day (BID) | ORAL | Status: DC | PRN
  Administered 2019-09-14 – 2019-09-16 (×4): 1 via ORAL
  Filled 2019-09-14 (×5): qty 1

## 2019-09-14 MED ORDER — DEXMEDETOMIDINE HCL 200 MCG/2ML IV SOLN
INTRAVENOUS | Status: DC | PRN
  Administered 2019-09-14: 12 ug via INTRAVENOUS

## 2019-09-14 MED ORDER — BISACODYL 10 MG RE SUPP: 10.0000 mg | Freq: Every day | RECTAL | Status: DC | PRN

## 2019-09-14 MED ORDER — LABETALOL HCL 5 MG/ML IV SOLN
10.0000 mg | INTRAVENOUS | Status: DC | PRN
  Administered 2019-09-15 (×3): 10 mg via INTRAVENOUS
  Filled 2019-09-14 (×3): qty 4

## 2019-09-14 MED ORDER — ROCURONIUM BROMIDE 100 MG/10ML IV SOLN
INTRAVENOUS | Status: DC | PRN
  Administered 2019-09-14: 30 mg via INTRAVENOUS
  Administered 2019-09-14: 70 mg via INTRAVENOUS
  Administered 2019-09-14: 10 mg via INTRAVENOUS

## 2019-09-14 MED ORDER — PROTAMINE SULFATE 10 MG/ML IV SOLN
INTRAVENOUS | Status: AC
  Filled 2019-09-14: qty 10

## 2019-09-14 MED ORDER — CHLORHEXIDINE GLUCONATE CLOTH 2 % EX PADS: 6.0000 | MEDICATED_PAD | Freq: Once | CUTANEOUS | Status: DC

## 2019-09-14 MED ORDER — ONDANSETRON HCL 4 MG/2ML IJ SOLN
INTRAMUSCULAR | Status: DC | PRN
  Administered 2019-09-14: 4 mg via INTRAVENOUS

## 2019-09-14 MED ORDER — PROPOFOL 10 MG/ML IV BOLUS
INTRAVENOUS | Status: DC | PRN
  Administered 2019-09-14: 30 mg via INTRAVENOUS
  Administered 2019-09-14: 150 mg via INTRAVENOUS

## 2019-09-14 MED ORDER — SODIUM CHLORIDE 0.9 % IV SOLN: INTRAVENOUS | Status: DC

## 2019-09-14 MED ORDER — MIDAZOLAM HCL 5 MG/5ML IJ SOLN
INTRAMUSCULAR | Status: DC | PRN
  Administered 2019-09-14: 2 mg via INTRAVENOUS

## 2019-09-14 MED ORDER — HYDRALAZINE HCL 20 MG/ML IJ SOLN: 5.0000 mg | INTRAMUSCULAR | Status: DC | PRN

## 2019-09-14 MED ORDER — DEXAMETHASONE SODIUM PHOSPHATE 10 MG/ML IJ SOLN
INTRAMUSCULAR | Status: DC | PRN
  Administered 2019-09-14: 10 mg via INTRAVENOUS

## 2019-09-14 MED ORDER — MIDAZOLAM HCL 2 MG/2ML IJ SOLN
INTRAMUSCULAR | Status: AC
  Filled 2019-09-14: qty 2

## 2019-09-14 MED ORDER — CEFAZOLIN SODIUM-DEXTROSE 2-4 GM/100ML-% IV SOLN
2.0000 g | INTRAVENOUS | Status: AC
  Administered 2019-09-14: 2 g via INTRAVENOUS

## 2019-09-14 MED ORDER — PHENOL 1.4 % MT LIQD: 1.0000 | OROMUCOSAL | Status: DC | PRN

## 2019-09-14 MED ORDER — PROTAMINE SULFATE 10 MG/ML IV SOLN
INTRAVENOUS | Status: DC | PRN
  Administered 2019-09-14: 100 mg via INTRAVENOUS

## 2019-09-14 MED ORDER — POTASSIUM CHLORIDE CRYS ER 20 MEQ PO TBCR: 20.0000 meq | EXTENDED_RELEASE_TABLET | Freq: Every day | ORAL | Status: DC | PRN

## 2019-09-14 MED ORDER — PANTOPRAZOLE SODIUM 40 MG PO TBEC
40.0000 mg | DELAYED_RELEASE_TABLET | Freq: Every day | ORAL | Status: DC
  Administered 2019-09-14: 40 mg via ORAL
  Filled 2019-09-14 (×2): qty 1

## 2019-09-14 MED ORDER — ROCURONIUM BROMIDE 10 MG/ML (PF) SYRINGE
PREFILLED_SYRINGE | INTRAVENOUS | Status: AC
  Filled 2019-09-14: qty 10

## 2019-09-14 MED ORDER — SODIUM CHLORIDE 0.9 % IV SOLN: 500.0000 mL | Freq: Once | INTRAVENOUS | Status: DC | PRN

## 2019-09-14 MED ORDER — MORPHINE SULFATE (PF) 2 MG/ML IV SOLN
2.0000 mg | INTRAVENOUS | Status: DC | PRN
  Administered 2019-09-14: 22:00:00 2 mg via INTRAVENOUS
  Filled 2019-09-14 (×2): qty 1

## 2019-09-14 MED ORDER — ASPIRIN EC 81 MG PO TBEC
81.0000 mg | DELAYED_RELEASE_TABLET | Freq: Every day | ORAL | Status: DC
  Administered 2019-09-14 – 2019-09-15 (×2): 81 mg via ORAL
  Filled 2019-09-14 (×2): qty 1

## 2019-09-14 MED ORDER — DOCUSATE SODIUM 100 MG PO CAPS
100.0000 mg | ORAL_CAPSULE | Freq: Every day | ORAL | Status: DC
  Filled 2019-09-14: qty 1

## 2019-09-14 SURGICAL SUPPLY — 45 items
BAG ISOLATION DRAPE 18X18 (DRAPES) ×2 IMPLANT
CANISTER SUCT 3000ML PPV (MISCELLANEOUS) ×2 IMPLANT
CANNULA VESSEL 3MM 2 BLNT TIP (CANNULA) ×4 IMPLANT
CLIP VESOCCLUDE MED 24/CT (CLIP) ×2 IMPLANT
CLIP VESOCCLUDE SM WIDE 24/CT (CLIP) ×2 IMPLANT
DERMABOND ADVANCED (GAUZE/BANDAGES/DRESSINGS) ×2
DERMABOND ADVANCED .7 DNX12 (GAUZE/BANDAGES/DRESSINGS) ×2 IMPLANT
DRAIN HEMOVAC 1/8 X 5 (WOUND CARE) IMPLANT
DRAPE ISOLATION BAG 18X18 (DRAPES) ×2
ELECT REM PT RETURN 9FT ADLT (ELECTROSURGICAL) ×2
ELECTRODE REM PT RTRN 9FT ADLT (ELECTROSURGICAL) ×1 IMPLANT
EVACUATOR SILICONE 100CC (DRAIN) IMPLANT
GLOVE BIO SURGEON STRL SZ7.5 (GLOVE) ×2 IMPLANT
GLOVE BIOGEL PI IND STRL 6.5 (GLOVE) ×4 IMPLANT
GLOVE BIOGEL PI INDICATOR 6.5 (GLOVE) ×4
GLOVE SS BIOGEL STRL SZ 6.5 (GLOVE) ×1 IMPLANT
GLOVE SS BIOGEL STRL SZ 7.5 (GLOVE) ×1 IMPLANT
GLOVE SUPERSENSE BIOGEL SZ 6.5 (GLOVE) ×1
GLOVE SUPERSENSE BIOGEL SZ 7.5 (GLOVE) ×1
GOWN STRL REUS W/ TWL LRG LVL3 (GOWN DISPOSABLE) ×3 IMPLANT
GOWN STRL REUS W/TWL LRG LVL3 (GOWN DISPOSABLE) ×3
GRAFT HEMASHIELD 8MM (Vascular Products) ×1 IMPLANT
GRAFT VASC STRG 30X8KNIT (Vascular Products) ×1 IMPLANT
HEMOSTAT SPONGE AVITENE ULTRA (HEMOSTASIS) IMPLANT
KIT BASIN OR (CUSTOM PROCEDURE TRAY) ×2 IMPLANT
KIT TURNOVER KIT B (KITS) ×2 IMPLANT
LOOP VESSEL MINI RED (MISCELLANEOUS) ×2 IMPLANT
NS IRRIG 1000ML POUR BTL (IV SOLUTION) ×4 IMPLANT
PACK PERIPHERAL VASCULAR (CUSTOM PROCEDURE TRAY) ×2 IMPLANT
PAD ARMBOARD 7.5X6 YLW CONV (MISCELLANEOUS) ×4 IMPLANT
STAPLER VISISTAT 35W (STAPLE) IMPLANT
SUT PROLENE 5 0 C 1 24 (SUTURE) ×8 IMPLANT
SUT PROLENE 6 0 CC (SUTURE) IMPLANT
SUT SILK 3 0 (SUTURE)
SUT SILK 3-0 18XBRD TIE 12 (SUTURE) IMPLANT
SUT VIC AB 2-0 SH 27 (SUTURE) ×2
SUT VIC AB 2-0 SH 27XBRD (SUTURE) ×2 IMPLANT
SUT VIC AB 3-0 SH 27 (SUTURE) ×3
SUT VIC AB 3-0 SH 27X BRD (SUTURE) ×3 IMPLANT
SUT VICRYL 4-0 PS2 18IN ABS (SUTURE) ×4 IMPLANT
TAPE UMBILICAL COTTON 1/8X30 (MISCELLANEOUS) IMPLANT
TOWEL GREEN STERILE (TOWEL DISPOSABLE) ×2 IMPLANT
TRAY FOLEY MTR SLVR 16FR STAT (SET/KITS/TRAYS/PACK) ×2 IMPLANT
UNDERPAD 30X30 (UNDERPADS AND DIAPERS) ×2 IMPLANT
WATER STERILE IRR 1000ML POUR (IV SOLUTION) ×2 IMPLANT

## 2019-09-14 NOTE — Anesthesia Postprocedure Evaluation (Signed)
Anesthesia Post Note  Patient: Zola Button  Procedure(s) Performed: LEFT TO RIGHT BYPASS GRAFT FEMORAL-FEMORAL ARTERY USING HEMASHIELD GOLD GRAFT (Bilateral )     Patient location during evaluation: PACU Anesthesia Type: General Level of consciousness: awake and alert Pain management: pain level controlled Vital Signs Assessment: post-procedure vital signs reviewed and stable Respiratory status: spontaneous breathing, nonlabored ventilation and respiratory function stable Cardiovascular status: blood pressure returned to baseline and stable Postop Assessment: no apparent nausea or vomiting Anesthetic complications: no    Last Vitals:  Vitals:   09/14/19 1312 09/14/19 1332  BP: 137/87 140/81  Pulse: 77 78  Resp: 16 18  Temp: 36.4 C 36.8 C  SpO2: 96% 97%    Last Pain:  Vitals:   09/14/19 1332  TempSrc: Oral  PainSc:                  Elener Custodio,W. EDMOND

## 2019-09-14 NOTE — Anesthesia Procedure Notes (Signed)
Procedure Name: Intubation Date/Time: 09/14/2019 9:03 AM Performed by: Janene Harvey, CRNA Pre-anesthesia Checklist: Patient identified, Emergency Drugs available, Suction available and Patient being monitored Patient Re-evaluated:Patient Re-evaluated prior to induction Oxygen Delivery Method: Circle system utilized Preoxygenation: Pre-oxygenation with 100% oxygen Induction Type: IV induction Ventilation: Mask ventilation without difficulty and Oral airway inserted - appropriate to patient size Laryngoscope Size: Mac and 4 Grade View: Grade I Tube type: Oral Number of attempts: 1 Airway Equipment and Method: Stylet and Oral airway Placement Confirmation: ETT inserted through vocal cords under direct vision,  positive ETCO2 and breath sounds checked- equal and bilateral Secured at: 22 cm Tube secured with: Tape Dental Injury: Teeth and Oropharynx as per pre-operative assessment  Comments: Intubation by Anthony Sar

## 2019-09-14 NOTE — Op Note (Signed)
Procedure: Right left femoral-femoral bypass  Preoperative diagnosis: Left leg claudication  Postoperative diagnosis: Same  Anesthesia: General  Assistant: Gretta Began MD, Wendi Maya, PA-C  Operative findings: 8 mm dacron graft  Operative details: After team informed consent, the patient was taken the operating.  The patient was placed in supine position operating table.  After induction general anesthesia and endotracheal ovation patient's entire lower abdomen and lower extremities were prepped and draped in usual sterile fashion.  Next a curvilinear incision was made in the right groin carried down through subcutaneous tissues down the level right common femoral artery.  There were a large number of lymphatics in this area and these were cauterized to prevent any lymphatic leak.  These were reflected laterally.  Common femoral artery was dissected free circumferentially.  Had a good pulse within it.  It was soft on palpation.  Right circumflex iliac branch was dissected free circumferentially Vesely placed around this.  Vesseloops placed around the distal external iliac artery.  Vesseloops were then placed around the profunda and superficial femoral arteries.  Attention was then turned to the left groin.  In similar fashion a curvilinear incision was made over the left common femoral artery.  There were also abundant lymphatics in this groin.  Incision was carried down through subcutaneous tissues down the level of the left common femoral artery.  There were several overlying venous tributaries which were ligated and divided tween silk ties.  Common femoral artery was dissected free circumferentially and a vessel loop placed around this.  Superficial femoral and profunda femoris artery were dissected free circumferentially and Vesseloops placed around these as well.  A subcutaneous tunnel was then created on top of the fascia connecting the left groin to the right groin incision.  8 mm dacryon graft  was then brought through this.  The patient was then given 10,000 units of intravenous heparin.  Right common femoral artery was controlled proximally with a Henley clamp and distally with Vesseloops.  Longitude opening was made in the anterior surface of the right common femoral artery the graft was beveled slightly and sewn end of graft to side of artery using a running 5-0 Prolene suture.  Despite completion anastomosis it was for blood backbled and thoroughly flushed anastomosis was secured clamps released there is good pulsatile flow in the graft immediately.  Attention was then turned to the left groin.  Left common femoral artery was controlled with a vessel loop proximally and the profunda and superficial femoral arteries controlled with Vesseloops distally.  Longitudinal opening was made in the anterior surface of the common femoral artery the graft was pulled taut the length and beveled and sewn endograft to side of artery using a running 5-0 Prolene suture.  Despite completion anastomosis it was for blood backbled and thoroughly flushed reanastomosed was secured clamps released was pulsatile flow in the superficial femoral artery immediately.  There was good Doppler flow in the foot and the posterior tibial and dorsalis pedis areas of the foot.  By the conclusion the case the patient had a palpable posterior tibial pulse in the left foot.  The patient was given 100 mg of protamine.  Hemostasis was obtained with direct pressure.  Both groins were then closed with multiple layers a running 2-0 and 3-0 Vicryl suture trying to incorporate as much of the lymphatics as possible to prevent further leakage.  Subcutaneous tissues were closed with a 3-0 Vicryl just below the skin edge.  Skin was closed with 4-0 Vicryl subcuticular  bilaterally.  Dermabond was applied.  Patient tired procedure well and there were no complications.  Instrument sponge and needle counts correct in the case.  Patient was taken to  recovery in stable condition.  Ruta Hinds, MD [facility] Office: [phone]

## 2019-09-14 NOTE — Anesthesia Preprocedure Evaluation (Addendum)
Anesthesia Evaluation  Patient identified by MRN, date of birth, ID band Patient awake    Reviewed: Allergy & Precautions, H&P , NPO status , Patient's Chart, lab work & pertinent test results  Airway Mallampati: II  TM Distance: >3 FB Neck ROM: Full    Dental no notable dental hx. (+) Edentulous Upper, Edentulous Lower, Dental Advisory Given   Pulmonary Current SmokerPatient did not abstain from smoking.,    Pulmonary exam normal breath sounds clear to auscultation       Cardiovascular + Peripheral Vascular Disease  negative cardio ROS   Rhythm:Regular Rate:Normal     Neuro/Psych negative neurological ROS  negative psych ROS   GI/Hepatic negative GI ROS, Neg liver ROS,   Endo/Other  negative endocrine ROS  Renal/GU negative Renal ROS  negative genitourinary   Musculoskeletal   Abdominal   Peds  Hematology negative hematology ROS (+)   Anesthesia Other Findings   Reproductive/Obstetrics negative OB ROS                            Anesthesia Physical Anesthesia Plan  ASA: III  Anesthesia Plan: General   Post-op Pain Management:    Induction: Intravenous  PONV Risk Score and Plan: 2 and Ondansetron, Dexamethasone and Midazolam  Airway Management Planned: Oral ETT  Additional Equipment:   Intra-op Plan:   Post-operative Plan: Extubation in OR  Informed Consent: I have reviewed the patients History and Physical, chart, labs and discussed the procedure including the risks, benefits and alternatives for the proposed anesthesia with the patient or authorized representative who has indicated his/her understanding and acceptance.     Dental advisory given  Plan Discussed with: CRNA  Anesthesia Plan Comments:         Anesthesia Quick Evaluation

## 2019-09-14 NOTE — Interval H&P Note (Signed)
History and Physical Interval Note:  09/14/2019 7:30 AM  Anthony Todd  has presented today for surgery, with the diagnosis of PERIPHERAL ARTERY DISEASE.  The various methods of treatment have been discussed with the patient and family. After consideration of risks, benefits and other options for treatment, the patient has consented to  Procedure(s): LEFT TO RIGHT BYPASS GRAFT FEMORAL-FEMORAL ARTERY (N/A) as a surgical intervention.  The patient's history has been reviewed, patient examined, no change in status, stable for surgery.  I have reviewed the patient's chart and labs.  Questions were answered to the patient's satisfaction.     Fabienne Bruns

## 2019-09-14 NOTE — Transfer of Care (Signed)
Immediate Anesthesia Transfer of Care Note  Patient: Anthony Todd  Procedure(s) Performed: LEFT TO RIGHT BYPASS GRAFT FEMORAL-FEMORAL ARTERY USING HEMASHIELD GOLD GRAFT (Bilateral )  Patient Location: PACU  Anesthesia Type:General  Level of Consciousness: awake  Airway & Oxygen Therapy: Patient Spontanous Breathing and Patient connected to face mask oxygen  Post-op Assessment: Report given to RN and Post -op Vital signs reviewed and stable  Post vital signs: Reviewed  Last Vitals:  Vitals Value Taken Time  BP 136/98 09/14/19 1140  Temp    Pulse 96 09/14/19 1142  Resp 15 09/14/19 1142  SpO2 93 % 09/14/19 1142  Vitals shown include unvalidated device data.  Last Pain:  Vitals:   09/14/19 0721  TempSrc: Oral  PainSc: 0-No pain      Patients Stated Pain Goal: 3 (09/14/19 0721)  Complications: No apparent anesthesia complications

## 2019-09-15 ENCOUNTER — Inpatient Hospital Stay (HOSPITAL_COMMUNITY): Payer: Medicare Other

## 2019-09-15 DIAGNOSIS — I739 Peripheral vascular disease, unspecified: Secondary | ICD-10-CM

## 2019-09-15 LAB — BASIC METABOLIC PANEL
Anion gap: 12 (ref 5–15)
BUN: 11 mg/dL (ref 6–20)
CO2: 21 mmol/L — ABNORMAL LOW (ref 22–32)
Calcium: 9.1 mg/dL (ref 8.9–10.3)
Chloride: 105 mmol/L (ref 98–111)
Creatinine, Ser: 0.91 mg/dL (ref 0.61–1.24)
GFR calc Af Amer: >60 mL/min (ref >60)
GFR calc non Af Amer: >60 mL/min (ref >60)
Glucose, Bld: 159 mg/dL — ABNORMAL HIGH (ref 70–99)
Potassium: 4 mmol/L (ref 3.5–5.1)
Sodium: 138 mmol/L (ref 135–145)

## 2019-09-15 LAB — CBC
HCT: 45.2 % (ref 39.0–52.0)
Hemoglobin: 16.1 g/dL (ref 13.0–17.0)
MCH: 30.5 pg (ref 26.0–34.0)
MCHC: 35.6 g/dL (ref 30.0–36.0)
MCV: 85.6 fL (ref 80.0–100.0)
Platelets: 174 10*3/uL (ref 150–400)
RBC: 5.28 MIL/uL (ref 4.22–5.81)
RDW: 13 % (ref 11.5–15.5)
WBC: 18.1 10*3/uL — ABNORMAL HIGH (ref 4.0–10.5)
nRBC: 0 % (ref 0.0–0.2)

## 2019-09-15 NOTE — Evaluation (Signed)
Occupational Therapy Evaluation Patient Details Name: Anthony Todd MRN: [id number] DOB: Oct 24, 1960 Today's Date: 09/15/2019    History of Present Illness Patient is a 58 year old male diagnosed with PAD and is s/p LEFT TO RIGHT BYPASS GRAFT FEMORAL-FEMORAL ARTERY.    Clinical Impression   Patient is a 59 year old male that lives with his spouse in a single level home with 3 steps to enter. Patient reports independent at baseline without AD for mobility and self care. Patient reports increased difficulty lately ambulating far distances and would have to take seated breaks. Patient has followed up with neurosurgery regarding lower back pain. Currently patient is independent with transfers, reports independently ambulating to bathroom and sink this morning for g/h. Patient supervision level for LB dressing with increased time due to pain in groin with doff/donning socks. OT educate patient on available LB dressing AE to minimize pain, patient verbalize understanding and is familiar with equipment. No further acute OT needs at this time, will discharge acute services.    Follow Up Recommendations  No OT follow up;Supervision - Intermittent    Equipment Recommendations  None recommended by OT       Precautions / Restrictions Precautions Precautions: None Restrictions Weight Bearing Restrictions: No      Mobility Bed Mobility               General bed mobility comments: seated in recliner  Transfers Overall transfer level: Independent Equipment used: None             General transfer comment: required no physical assist, no loss of balance noted    Balance Overall balance assessment: No apparent balance deficits (not formally assessed)                                         ADL either performed or assessed with clinical judgement   ADL Overall ADL's : Needs assistance/impaired Eating/Feeding: Independent   Grooming: Independent;Standing Grooming  Details (indicate cue type and reason): patient reports already performing g/h at sink this AM on his own Upper Body Bathing: Independent;Sitting   Lower Body Bathing: Supervison/ safety;Sitting/lateral leans;Sit to/from stand   Upper Body Dressing : Independent;Sitting   Lower Body Dressing: Supervision/safety;Sitting/lateral leans Lower Body Dressing Details (indicate cue type and reason): with increase effort/time patient able to doff/don sock. educate patient on AE for LB dressing as patient also has hx of back pain. patient verbalize understanding Toilet Transfer: Engineer, manufacturing systems Details (indicate cue type and reason): simulated with transfer to recliner, demonstrates adequate body mechanics, patient reports he has been using bathroom I this AM Toileting- Clothing Manipulation and Hygiene: Independent;Sitting/lateral lean;Sit to/from stand       Functional mobility during ADLs: Supervision/safety;Modified independent General ADL Comments: patient is close to baseline level with self care, does require increased time for LB ADLs due to pain at incision site                  Pertinent Vitals/Pain Pain Assessment: Faces Faces Pain Scale: Hurts little more Pain Location: groin with mobility Pain Descriptors / Indicators: Burning Pain Intervention(s): Premedicated before session;Monitored during session     Hand Dominance Right   Extremity/Trunk Assessment Upper Extremity Assessment Upper Extremity Assessment: Overall WFL for tasks assessed   Lower Extremity Assessment Lower Extremity Assessment: Defer to PT evaluation   Cervical / Trunk Assessment Cervical / Trunk Assessment:  Normal   Communication Communication Communication: No difficulties   Cognition Arousal/Alertness: Awake/alert Behavior During Therapy: WFL for tasks assessed/performed Overall Cognitive Status: Within Functional Limits for tasks assessed                                                 Home Living Family/patient expects to be discharged to:: Private residence Living Arrangements: Spouse/significant other Available Help at Discharge: Family;Available PRN/intermittently Type of Home: House Home Access: Stairs to enter [employer] of Steps: 3 Entrance Stairs-Rails: Right;Left Home Layout: One level     Bathroom Shower/Tub: Chief Strategy Officer: Standard     Home Equipment: None          Prior Functioning/Environment Level of Independence: Independent                 OT Problem List: Pain       AM-PAC OT "6 Clicks" Daily Activity     Outcome Measure Help from another person eating meals?: None Help from another person taking care of personal grooming?: None Help from another person toileting, which includes using toliet, bedpan, or urinal?: None Help from another person bathing (including washing, rinsing, drying)?: A Little Help from another person to put on and taking off regular upper body clothing?: None Help from another person to put on and taking off regular lower body clothing?: A Little 6 Click Score: 22   End of Session  Activity Tolerance: Patient tolerated treatment well Patient left: in chair;with call bell/phone within reach  OT Visit Diagnosis: Other abnormalities of gait and mobility (R26.89);Pain Pain - part of body: (groin)                Time: 5573-2202 OT Time Calculation (min): 19 min Charges:  OT General Charges $OT Visit: 1 Visit OT Evaluation $OT Eval Moderate Complexity: 1 Mod  Myrtie Neither OT OT office: [phone]   Carmelia Roller 09/15/2019, 8:31 AM

## 2019-09-15 NOTE — Progress Notes (Signed)
ABI has been completed.   Preliminary results in CV Proc.   Blanch Media 09/15/2019 10:44 AM

## 2019-09-15 NOTE — Progress Notes (Signed)
[facility] [place]  Subjective  - some pain, left foot feels warmer   Objective (!) 163/90 93 98.6 F (37 C) (Oral) 14 94%  Intake/Output Summary (Last 24 hours) at 09/15/2019 0634 Last data filed at 09/15/2019 0300 Gross per 24 hour  Intake 2100 ml  Output 1350 ml  Net 750 ml   Incisions clean no hematoma Some ecchymosis right side  Assessment/Planning: POD #1 fem fem bypass Ambulate today  Check post op ABIs  Fabienne Bruns 09/15/2019 6:34 AM --  Laboratory Lab Results: Recent Labs    09/15/19 0014  WBC 18.1*  HGB 16.1  HCT 45.2  PLT 174   BMET Recent Labs    09/15/19 0014  NA 138  K 4.0  CL 105  CO2 21*  GLUCOSE 159*  BUN 11  CREATININE 0.91  CALCIUM 9.1    COAG Lab Results  Component Value Date   INR 0.9 09/09/2019   No results found for: PTT

## 2019-09-15 NOTE — Progress Notes (Signed)
MOBILITY TEAM - Progress Note   09/15/19 1000  Mobility  Activity Ambulated in hall  Level of Assistance Independent  Assistive Device None  Distance Ambulated (ft) 500 ft  Mobility Response Tolerated well   Encouraged patient to walk in hallway at least 2-3x more today, pt agreeable. Discussed importance of gentle LE stretching/ROM, edema control.  Ina Homes, PT, DPT Mobility Team Pager [phone]

## 2019-09-15 NOTE — Progress Notes (Signed)
PT Cancellation Note  Patient Details Name: Anthony Todd MRN: [id number] DOB: May 04, 1961   Cancelled Treatment:    Reason Eval/Treat Not Completed: PT screened, no needs identified, will sign off. Pt mobilizing independently without difficulty.    Angelina Ok [facility] 09/15/2019, 9:57 AM Skip Mayer PT Acute Rehabilitation Services Pager [phone] Office [phone]

## 2019-09-15 NOTE — Plan of Care (Signed)
  Problem: Education: Goal: Knowledge of General Education information will improve Description: Including pain rating scale, medication(s)/side effects and non-pharmacologic comfort measures Outcome: Progressing   Problem: Health Behavior/Discharge Planning: Goal: Ability to manage health-related needs will improve Outcome: Progressing   Problem: Clinical Measurements: Goal: Cardiovascular complication will be avoided Outcome: Progressing   Problem: Activity: Goal: Risk for activity intolerance will decrease Outcome: Progressing   Problem: Elimination: Goal: Will not experience complications related to bowel motility Outcome: Progressing Goal: Will not experience complications related to urinary retention Outcome: Progressing   Problem: Pain Managment: Goal: General experience of comfort will improve Outcome: Progressing   Problem: Safety: Goal: Ability to remain free from injury will improve Outcome: Progressing   Problem: Skin Integrity: Goal: Risk for impaired skin integrity will decrease Outcome: Progressing   Problem: Education: Goal: Knowledge of prescribed regimen will improve Outcome: Progressing   Problem: Activity: Goal: Ability to tolerate increased activity will improve Outcome: Progressing   Problem: Clinical Measurements: Goal: Signs and symptoms of graft occlusion will improve Outcome: Progressing   Problem: Skin Integrity: Goal: Demonstration of wound healing without infection will improve Outcome: Progressing

## 2019-09-16 LAB — LIPID PANEL
Cholesterol: 154 mg/dL (ref 0–200)
HDL: 21 mg/dL — ABNORMAL LOW (ref >40)
LDL Cholesterol: 92 mg/dL (ref 0–99)
Total CHOL/HDL Ratio: 7.3 RATIO
Triglycerides: 204 mg/dL — ABNORMAL HIGH (ref <150)
VLDL: 41 mg/dL — ABNORMAL HIGH (ref 0–40)

## 2019-09-16 LAB — HEMOGLOBIN A1C
Hgb A1c MFr Bld: 5.8 % — ABNORMAL HIGH (ref 4.8–5.6)
Mean Plasma Glucose: 119.76 mg/dL

## 2019-09-16 MED ORDER — OXYCODONE-ACETAMINOPHEN 5-325 MG PO TABS: 1.0000 | ORAL_TABLET | Freq: Four times a day (QID) | ORAL | 0 refills | Status: DC | PRN

## 2019-09-16 NOTE — Progress Notes (Signed)
Patient discharged per MD order. IV  And tele removed. Discharge instructions reviewed with pt. Questions answered to satisfaction. Pt escorted to private vehicle.   Yesika Rispoli M

## 2019-09-16 NOTE — Progress Notes (Addendum)
[facility]   Subjective  - Doing well.  Soreness at incisions.  Objective 139/84 68 98 F (36.7 C) (Oral) 16 96%  Intake/Output Summary (Last 24 hours) at 09/16/2019 0709 Last data filed at 09/15/2019 1447 Gross per 24 hour  Intake 300 ml  Output 800 ml  Net -500 ml     Palpable AT left with B LE doppler signals DP/PT/Peroneal Groins soft without hematoma, + ecchymosis Lungs non labored breathing Heart RRR  ABI Findings:  +---------+------------------+-----+---------+--------+  Right  Rt Pressure (mmHg)IndexWaveform Comment   +---------+------------------+-----+---------+--------+  Brachial 176           triphasic      +---------+------------------+-----+---------+--------+  PTA   128        0.73 biphasic       +---------+------------------+-----+---------+--------+  DP    127        0.72 biphasic       +---------+------------------+-----+---------+--------+  Great Toe83        0.47            +---------+------------------+-----+---------+--------+   +---------+------------------+-----+---------+-------+  Left   Lt Pressure (mmHg)IndexWaveform Comment  +---------+------------------+-----+---------+-------+  Brachial 165           triphasic      +---------+------------------+-----+---------+-------+  PTA   124        0.70 biphasic       +---------+------------------+-----+---------+-------+  DP    113        0.64 biphasic       +---------+------------------+-----+---------+-------+  Great Toe75        0.43            +---------+------------------+-----+---------+-------+   +-------+-----------+-----------+------------+------------+  ABI/TBIToday's ABIToday's TBIPrevious ABIPrevious TBI  +-------+-----------+-----------+------------+------------+  Right  0.73    0.47                  +-------+-----------+-----------+------------+------------+  Left  0.70    0.43                  +-------+-----------+-----------+------------+------------+   Summary:  Right: Resting right ankle-brachial index indicates moderate right lower  extremity arterial disease. The right toe-brachial index is abnormal.   Left: Resting left ankle-brachial index indicates moderate left lower  extremity arterial disease. The left toe-brachial index is abnormal.    Assessment/Planning: POD # 2 fem fem bypass Pre-op Left leg claudication secondary to left common iliac occlusion.  Improved arterial flow with 70% perfusion per ABI's on the left LE.  Stable for discharge.  Cont. Aspirin and Zocor daily.  F/U with Dr. Darrick Penna in 2-3 weeks.  Anthony Todd 09/16/2019 7:09 AM --  Laboratory Lab Results: Recent Labs    09/15/19 0014  WBC 18.1*  HGB 16.1  HCT 45.2  PLT 174   BMET Recent Labs    09/15/19 0014  NA 138  K 4.0  CL 105  CO2 21*  GLUCOSE 159*  BUN 11  CREATININE 0.91  CALCIUM 9.1    COAG Lab Results  Component Value Date   INR 0.9 09/09/2019   No results found for: PTT  I have interviewed the patient and examined the patient. I agree with the findings by the PA.  His incisions look fine.  He has palpable pedal pulses.  Agree with plans for discharge today.  Cari Caraway, MD [phone]

## 2019-09-16 NOTE — Plan of Care (Signed)
  Problem: Education: Goal: Knowledge of General Education information will improve Description: Including pain rating scale, medication(s)/side effects and non-pharmacologic comfort measures Outcome: Adequate for Discharge   Problem: Health Behavior/Discharge Planning: Goal: Ability to manage health-related needs will improve Outcome: Adequate for Discharge   Problem: Clinical Measurements: Goal: Cardiovascular complication will be avoided Outcome: Adequate for Discharge   Problem: Activity: Goal: Risk for activity intolerance will decrease Outcome: Adequate for Discharge   Problem: Elimination: Goal: Will not experience complications related to bowel motility Outcome: Adequate for Discharge Goal: Will not experience complications related to urinary retention Outcome: Adequate for Discharge   Problem: Pain Managment: Goal: General experience of comfort will improve Outcome: Adequate for Discharge   Problem: Safety: Goal: Ability to remain free from injury will improve Outcome: Adequate for Discharge   Problem: Skin Integrity: Goal: Risk for impaired skin integrity will decrease Outcome: Adequate for Discharge   Problem: Education: Goal: Knowledge of prescribed regimen will improve Outcome: Adequate for Discharge   Problem: Activity: Goal: Ability to tolerate increased activity will improve Outcome: Adequate for Discharge   Problem: Clinical Measurements: Goal: Signs and symptoms of graft occlusion will improve Outcome: Adequate for Discharge   Problem: Skin Integrity: Goal: Demonstration of wound healing without infection will improve Outcome: Adequate for Discharge

## 2019-09-16 NOTE — Discharge Instructions (Signed)
 [facility] [place]  Discharge instructions  Lower Extremity Bypass Surgery  Please refer to the following instruction for your post-procedure care. Your surgeon or physician assistant will discuss any changes with you.  Activity  You are encouraged to walk as much as you can. You can slowly return to normal activities during the month after your surgery. Avoid strenuous activity and heavy lifting until your doctor tells you it's OK. Avoid activities such as vacuuming or swinging a golf club. Do not drive until your doctor give the OK and you are no longer taking prescription pain medications. It is also normal to have difficulty with sleep habits, eating and bowel movement after surgery. These will go away with time.  Bathing/Showering  You may shower after you go home. Do not soak in a bathtub, hot tub, or swim until the incision heals completely.  Incision Care  Clean your incision with mild soap and water. Shower every day. Pat the area dry with a clean towel. You do not need a bandage unless otherwise instructed. Do not apply any ointments or creams to your incision. If you have open wounds you will be instructed how to care for them or a visiting nurse may be arranged for you. If you have staples or sutures along your incision they will be removed at your post-op appointment. You may have skin glue on your incision. Do not peel it off. It will come off on its own in about one week. If you have a great deal of moisture in your groin, use a gauze help keep this area dry.  Diet  Resume your normal diet. There are no special food restrictions following this procedure. A low fat/ low cholesterol diet is recommended for all patients with vascular disease. In order to heal from your surgery, it is CRITICAL to get adequate nutrition. Your body requires vitamins, minerals, and protein. Vegetables are the best source of vitamins and minerals. Vegetables also provide the  perfect balance of protein. Processed food has little nutritional value, so try to avoid this.  Medications  Resume taking all your medications unless your doctor or nurse practitioner tells you not to. If your incision is causing pain, you may take over-the-counter pain relievers such as acetaminophen (Tylenol). If you were prescribed a stronger pain medication, please aware these medication can cause nausea and constipation. Prevent nausea by taking the medication with a snack or meal. Avoid constipation by drinking plenty of fluids and eating foods with high amount of fiber, such as fruits, vegetables, and grains. Take Colase 100 mg (an over-the-counter stool softener) twice a day as needed for constipation. Do not take Tylenol if you are taking prescription pain medications.  Follow Up  Our office will schedule a follow up appointment 2-3 weeks following discharge.  Please call us immediately for any of the following conditions  Severe or worsening pain in your legs or feet while at rest or while walking Increase pain, redness, warmth, or drainage (pus) from your incision site(s) Fever of 101 degree or higher The swelling in your leg with the bypass suddenly worsens and becomes more painful than when you were in the hospital If you have been instructed to feel your graft pulse then you should do so every day. If you can no longer feel this pulse, call the office immediately. Not all patients are given this instruction.  Leg swelling is common after leg bypass surgery.  The swelling should improve over a few months   following surgery. To improve the swelling, you may elevate your legs above the level of your heart while you are sitting or resting. Your surgeon or physician assistant may ask you to apply an ACE wrap or wear compression (TED) stockings to help to reduce swelling.  Reduce your risk of vascular disease  Stop smoking. If you would like help call [facility] at 1-800-QUIT-NOW  (1-800-784-8669) or [place] at 336-586-4000.  Manage your cholesterol Maintain a desired weight Control your diabetes weight Control your diabetes Keep your blood pressure down  If you have any questions, please call the office at [phone]   

## 2019-09-17 DIAGNOSIS — I708 Atherosclerosis of other arteries: Secondary | ICD-10-CM | POA: Diagnosis not present

## 2019-09-17 DIAGNOSIS — T82322A Displacement of femoral arterial graft (bypass), initial encounter: Secondary | ICD-10-CM | POA: Diagnosis not present

## 2019-09-17 DIAGNOSIS — Z7982 Long term (current) use of aspirin: Secondary | ICD-10-CM | POA: Diagnosis not present

## 2019-09-17 DIAGNOSIS — I10 Essential (primary) hypertension: Secondary | ICD-10-CM | POA: Diagnosis not present

## 2019-09-17 DIAGNOSIS — I739 Peripheral vascular disease, unspecified: Secondary | ICD-10-CM | POA: Diagnosis not present

## 2019-09-17 DIAGNOSIS — Z48812 Encounter for surgical aftercare following surgery on the circulatory system: Secondary | ICD-10-CM | POA: Diagnosis not present

## 2019-09-17 DIAGNOSIS — Z720 Tobacco use: Secondary | ICD-10-CM | POA: Diagnosis not present

## 2019-09-17 DIAGNOSIS — E785 Hyperlipidemia, unspecified: Secondary | ICD-10-CM | POA: Diagnosis not present

## 2019-09-17 NOTE — Discharge Summary (Signed)
[facility] Discharge Summary   Patient ID:  Anthony Todd MRN: [id number] DOB/AGE: 02/02/1961 59 y.o.  Admit date: 09/14/2019 Discharge date: 09/16/19 Date of Surgery: 09/14/2019 Surgeon: Surgeon(s): Fields, Janetta Hora, MD Early, Kristen Loader, MD  Admission Diagnosis: Iliac artery stenosis, left [facility]) [I77.1]  Discharge Diagnoses:  Iliac artery stenosis, left (HCC) [I77.1]  Secondary Diagnoses: Past Medical History:  Diagnosis Date  . Back pain   . Hyperlipidemia   . PAD (peripheral artery disease) (HCC)   . Stenosis of iliac artery (HCC)     Procedure(s): LEFT TO RIGHT BYPASS GRAFT FEMORAL-FEMORAL ARTERY USING HEMASHIELD GOLD GRAFT  Discharged Condition: stable  HPI: Patient is a 59 year old male who underwent attempted recanalization of the left common iliac artery for a chronic left iliac occlusion.  He is here to discuss whether or not to undergo right to left femoral-femoral bypass.  He still has symptoms of left leg weakness which occurs with ambulation.  He was scheduled for right to left fem-fem bypass.   Hospital Course:  Anthony Todd is a 59 y.o. male is S/P  Procedure(s): LEFT TO RIGHT BYPASS GRAFT FEMORAL-FEMORAL ARTERY USING HEMASHIELD GOLD GRAFT  He has excellent doppler signals B DP/PT/_Peroneal.  Palpable fem-fem bypass graft.   +-------+-----------+-----------+------------+------------+  ABI/TBIToday's ABIToday's TBIPrevious ABIPrevious TBI  +-------+-----------+-----------+------------+------------+  Right 0.73    0.47                  +-------+-----------+-----------+------------+------------+  Left  0.70    0.43                  +-------+-----------+-----------+------------+------------+   Pre-opLeft leg claudication secondary to left common iliac occlusion.             Improved arterial flow with 70% perfusion per ABI's on the left LE.             Stable for discharge.  Cont. Aspirin  and Zocor daily.  F/U with Dr. Darrick Penna in 2-3 weeks.  Significant Diagnostic Studies: CBC Lab Results  Component Value Date   WBC 18.1 (H) 09/15/2019   HGB 16.1 09/15/2019   HCT 45.2 09/15/2019   MCV 85.6 09/15/2019   PLT 174 09/15/2019    BMET    Component Value Date/Time   NA 138 09/15/2019 0014   K 4.0 09/15/2019 0014   CL 105 09/15/2019 0014   CO2 21 (L) 09/15/2019 0014   GLUCOSE 159 (H) 09/15/2019 0014   BUN 11 09/15/2019 0014   CREATININE 0.91 09/15/2019 0014   CALCIUM 9.1 09/15/2019 0014   GFRNONAA >60 09/15/2019 0014   GFRAA >60 09/15/2019 0014   COAG Lab Results  Component Value Date   INR 0.9 09/09/2019     Disposition:  Discharge to :Home Discharge Instructions    Call MD for:  redness, tenderness, or signs of infection (pain, swelling, bleeding, redness, odor or green/yellow discharge around incision site)   Complete by: As directed    Call MD for:  severe or increased pain, loss or decreased feeling  in affected limb(s)   Complete by: As directed    Call MD for:  temperature >100.5   Complete by: As directed    Resume previous diet   Complete by: As directed      Allergies as of 09/16/2019      Reactions   Cymbalta [duloxetine Hcl] Rash      Medication List    TAKE these medications   aspirin EC 81 MG tablet Take 81  mg by mouth at bedtime.   oxyCODONE-acetaminophen 5-325 MG tablet Commonly known as: PERCOCET/ROXICET Take 1 tablet by mouth every 6 (six) hours as needed. What changed:   when to take this  reasons to take this   simvastatin 20 MG tablet Commonly known as: ZOCOR Take 20 mg by mouth at bedtime.   tamsulosin 0.4 MG Caps capsule Commonly known as: FLOMAX Take 0.4 mg by mouth at bedtime.      Verbal and written Discharge instructions given to the patient. Wound care per Discharge AVS Follow-up Information    [facility] Follow up.   Specialty: [place] Why: Vascular office pre-op referral for  any HH needs- they will call you post discharge to f/u Contact information: [place] [address] [phone]        Anthony Dutch, MD Follow up in 2 week(s).   Specialties: Vascular Surgery, Cardiology Why: office will call Contact information: [place] [address] [phone]           Signed: Roxy Horseman 09/17/2019, 1:39 PM - For VQI Registry use --- Instructions: Press F2 to tab through selections.  Delete question if not applicable.   Post-op:  Wound infection: No  Graft infection: No  Transfusion: No  If yes,  units given New Arrhythmia: No Ipsilateral amputation: [x ] no, [ ]  Minor, [ ]  BKA, [ ]  AKA Discharge patency: [x ] Primary, [ ]  Primary assisted, [ ]  Secondary, [ ]  Occluded Patency judged by: [ ]  Dopper only, [ ]  Palpable graft pulse, [ ]  Palpable distal pulse, [ ]  ABI inc. > 0.15, [ ]  Duplex +-------+-----------+-----------+------------+------------+  ABI/TBIToday's ABIToday's TBIPrevious ABIPrevious TBI  +-------+-----------+-----------+------------+------------+  Right 0.73    0.47                  +-------+-----------+-----------+------------+------------+  Left  0.70    0.43                  +-------+-----------+-----------+------------+------------+   D/C Ambulatory Status: Ambulatory  Complications: MI: [ ]  No, [ ]  Troponin only, [ ]  EKG or Clinical CHF: No Resp failure: [ ]  none, [ ]  Pneumonia, [ ]  Ventilator Chg in renal function: [ ]  none, [ ]  Inc. Cr > 0.5, [ ]  Temp. Dialysis, [ ]  Permanent dialysis Stroke: [ ]  None, [ ]  Minor, [ ]  Major Return to OR: No  Reason for return to OR: [ ]  Bleeding, [ ]  Infection, [ ]  Thrombosis, [ ]  Revision  Discharge medications: Statin use:  Yes ASA use:  Yes Plavix use:  No  for medical reason   Beta blocker use: No  for medical reason   Coumadin use: No  for medical reason

## 2019-09-18 DIAGNOSIS — I10 Essential (primary) hypertension: Secondary | ICD-10-CM | POA: Diagnosis not present

## 2019-09-18 DIAGNOSIS — E785 Hyperlipidemia, unspecified: Secondary | ICD-10-CM | POA: Diagnosis not present

## 2019-09-18 DIAGNOSIS — I739 Peripheral vascular disease, unspecified: Secondary | ICD-10-CM | POA: Diagnosis not present

## 2019-09-18 DIAGNOSIS — I708 Atherosclerosis of other arteries: Secondary | ICD-10-CM | POA: Diagnosis not present

## 2019-09-18 DIAGNOSIS — Z7982 Long term (current) use of aspirin: Secondary | ICD-10-CM | POA: Diagnosis not present

## 2019-09-18 DIAGNOSIS — Z48812 Encounter for surgical aftercare following surgery on the circulatory system: Secondary | ICD-10-CM | POA: Diagnosis not present

## 2019-09-18 DIAGNOSIS — Z720 Tobacco use: Secondary | ICD-10-CM | POA: Diagnosis not present

## 2019-09-18 DIAGNOSIS — T82322A Displacement of femoral arterial graft (bypass), initial encounter: Secondary | ICD-10-CM | POA: Diagnosis not present

## 2019-09-21 DIAGNOSIS — Z48812 Encounter for surgical aftercare following surgery on the circulatory system: Secondary | ICD-10-CM | POA: Diagnosis not present

## 2019-09-21 DIAGNOSIS — I708 Atherosclerosis of other arteries: Secondary | ICD-10-CM | POA: Diagnosis not present

## 2019-09-21 DIAGNOSIS — I10 Essential (primary) hypertension: Secondary | ICD-10-CM | POA: Diagnosis not present

## 2019-09-21 DIAGNOSIS — T82322A Displacement of femoral arterial graft (bypass), initial encounter: Secondary | ICD-10-CM | POA: Diagnosis not present

## 2019-09-21 DIAGNOSIS — E785 Hyperlipidemia, unspecified: Secondary | ICD-10-CM | POA: Diagnosis not present

## 2019-09-21 DIAGNOSIS — Z720 Tobacco use: Secondary | ICD-10-CM | POA: Diagnosis not present

## 2019-09-21 DIAGNOSIS — I739 Peripheral vascular disease, unspecified: Secondary | ICD-10-CM | POA: Diagnosis not present

## 2019-09-21 DIAGNOSIS — Z7982 Long term (current) use of aspirin: Secondary | ICD-10-CM | POA: Diagnosis not present

## 2019-09-22 ENCOUNTER — Telehealth: Payer: Self-pay

## 2019-09-22 NOTE — Telephone Encounter (Signed)
-----   Message from Sherren Kerns, MD sent at 09/22/2019  9:59 AM EDT ----- Regarding: RE: Home Health Contact: [phone] See this week ----- Message ----- From: Yolonda Kida, LPN Sent: 7/42/5956   2:19 PM EDT To: Sherren Kerns, MD Subject: Home Health                                    Kim with [facility] called and left a voice message.  Pt had recent procedure and has engorged scrotum (tries to use ice), pain, and incision is hard to palpate.  Percocet is not touching his pain.  He has an appt with you on 4-01.  Is it ok to wait until his appt to be re-evaluated?  Please advise.  Thanks,  [name]., LPN

## 2019-09-23 ENCOUNTER — Telehealth: Payer: Self-pay

## 2019-09-23 DIAGNOSIS — I10 Essential (primary) hypertension: Secondary | ICD-10-CM | POA: Diagnosis not present

## 2019-09-23 DIAGNOSIS — Z720 Tobacco use: Secondary | ICD-10-CM | POA: Diagnosis not present

## 2019-09-23 DIAGNOSIS — E785 Hyperlipidemia, unspecified: Secondary | ICD-10-CM | POA: Diagnosis not present

## 2019-09-23 DIAGNOSIS — Z48812 Encounter for surgical aftercare following surgery on the circulatory system: Secondary | ICD-10-CM | POA: Diagnosis not present

## 2019-09-23 DIAGNOSIS — I739 Peripheral vascular disease, unspecified: Secondary | ICD-10-CM | POA: Diagnosis not present

## 2019-09-23 DIAGNOSIS — Z7982 Long term (current) use of aspirin: Secondary | ICD-10-CM | POA: Diagnosis not present

## 2019-09-23 DIAGNOSIS — I708 Atherosclerosis of other arteries: Secondary | ICD-10-CM | POA: Diagnosis not present

## 2019-09-23 DIAGNOSIS — T82322A Displacement of femoral arterial graft (bypass), initial encounter: Secondary | ICD-10-CM | POA: Diagnosis not present

## 2019-09-23 NOTE — Telephone Encounter (Signed)
-----   Message from Sherren Kerns, MD sent at 09/23/2019  8:37 AM EDT ----- Regarding: RE: Home Health Contact: [phone] He needs to come in if he wants pain meds can come on any day to [facility] clinic or see me tomorrow ----- Message ----- From: Yolonda Kida, LPN Sent: 2/70/6237  12:53 PM EDT To: Sherren Kerns, MD Subject: RE: Home Health                                Mr. Cheese says he has transportation issues and no one to drive him to an earlier appt.  He is requesting more pain medicine to "take the edge off" until his next appt.  I told him I will relay this message but we would really like for him to come in earlier if he is able to.  [name]., LPN  ----- Message ----- From: Sherren Kerns, MD Sent: 09/22/2019   9:59 AM EDT To: Yolonda Kida, LPN Subject: RE: Home Health                                See this week ----- Message ----- From: Yolonda Kida, LPN Sent: 12/27/3149   2:19 PM EDT To: Sherren Kerns, MD Subject: Home Health                                    Kim with [facility] called and left a voice message.  Pt had recent procedure and has engorged scrotum (tries to use ice), pain, and incision is hard to palpate.  Percocet is not touching his pain.  He has an appt with you on 4-01.  Is it ok to wait until his appt to be re-evaluated?  Please advise.  Thanks,  [name]., LPN

## 2019-09-24 ENCOUNTER — Emergency Department (HOSPITAL_COMMUNITY): Payer: Medicare Other

## 2019-09-24 ENCOUNTER — Emergency Department (HOSPITAL_COMMUNITY)
Admission: EM | Admit: 2019-09-24 | Discharge: 2019-09-25 | Disposition: A | Discharge status: Home / Self Care | Payer: Medicare Other | Attending: Emergency Medicine | Admitting: Emergency Medicine

## 2019-09-24 ENCOUNTER — Other Ambulatory Visit: Payer: Self-pay

## 2019-09-24 ENCOUNTER — Ambulatory Visit (INDEPENDENT_AMBULATORY_CARE_PROVIDER_SITE_OTHER): Payer: Self-pay | Admitting: Physician Assistant

## 2019-09-24 ENCOUNTER — Encounter (HOSPITAL_COMMUNITY): Payer: Self-pay | Admitting: *Deleted

## 2019-09-24 VITALS — BP 133/85 | Temp 97.3°F | Resp 16 | Ht 69.0 in | Wt 239.1 lb

## 2019-09-24 DIAGNOSIS — R2243 Localized swelling, mass and lump, lower limb, bilateral: Secondary | ICD-10-CM | POA: Diagnosis not present

## 2019-09-24 DIAGNOSIS — Z7982 Long term (current) use of aspirin: Secondary | ICD-10-CM | POA: Diagnosis not present

## 2019-09-24 DIAGNOSIS — I771 Stricture of artery: Secondary | ICD-10-CM

## 2019-09-24 DIAGNOSIS — R079 Chest pain, unspecified: Secondary | ICD-10-CM

## 2019-09-24 DIAGNOSIS — F17210 Nicotine dependence, cigarettes, uncomplicated: Secondary | ICD-10-CM | POA: Insufficient documentation

## 2019-09-24 DIAGNOSIS — R030 Elevated blood-pressure reading, without diagnosis of hypertension: Secondary | ICD-10-CM | POA: Insufficient documentation

## 2019-09-24 DIAGNOSIS — Z79899 Other long term (current) drug therapy: Secondary | ICD-10-CM | POA: Diagnosis not present

## 2019-09-24 DIAGNOSIS — R0602 Shortness of breath: Secondary | ICD-10-CM | POA: Diagnosis not present

## 2019-09-24 DIAGNOSIS — R1030 Lower abdominal pain, unspecified: Secondary | ICD-10-CM | POA: Insufficient documentation

## 2019-09-24 DIAGNOSIS — R0789 Other chest pain: Secondary | ICD-10-CM | POA: Insufficient documentation

## 2019-09-24 DIAGNOSIS — R1900 Intra-abdominal and pelvic swelling, mass and lump, unspecified site: Secondary | ICD-10-CM | POA: Diagnosis not present

## 2019-09-24 DIAGNOSIS — I739 Peripheral vascular disease, unspecified: Secondary | ICD-10-CM

## 2019-09-24 LAB — BASIC METABOLIC PANEL
Anion gap: 8 (ref 5–15)
BUN: 15 mg/dL (ref 6–20)
CO2: 23 mmol/L (ref 22–32)
Calcium: 8.8 mg/dL — ABNORMAL LOW (ref 8.9–10.3)
Chloride: 105 mmol/L (ref 98–111)
Creatinine, Ser: 1.15 mg/dL (ref 0.61–1.24)
GFR calc Af Amer: >60 mL/min (ref >60)
GFR calc non Af Amer: >60 mL/min (ref >60)
Glucose, Bld: 122 mg/dL — ABNORMAL HIGH (ref 70–99)
Potassium: 3.9 mmol/L (ref 3.5–5.1)
Sodium: 136 mmol/L (ref 135–145)

## 2019-09-24 LAB — CBC
HCT: 44 % (ref 39.0–52.0)
Hemoglobin: 15.2 g/dL (ref 13.0–17.0)
MCH: 30.7 pg (ref 26.0–34.0)
MCHC: 34.5 g/dL (ref 30.0–36.0)
MCV: 88.9 fL (ref 80.0–100.0)
Platelets: 218 10*3/uL (ref 150–400)
RBC: 4.95 MIL/uL (ref 4.22–5.81)
RDW: 13.2 % (ref 11.5–15.5)
WBC: 12.3 10*3/uL — ABNORMAL HIGH (ref 4.0–10.5)
nRBC: 0 % (ref 0.0–0.2)

## 2019-09-24 LAB — TROPONIN I (HIGH SENSITIVITY): Troponin I (High Sensitivity): 6 ng/L (ref <18)

## 2019-09-24 MED ORDER — CEPHALEXIN 500 MG PO CAPS: 500.0000 mg | ORAL_CAPSULE | Freq: Two times a day (BID) | ORAL | 0 refills | Status: DC

## 2019-09-24 MED ORDER — OXYCODONE-ACETAMINOPHEN 5-325 MG PO TABS: 1.0000 | ORAL_TABLET | Freq: Four times a day (QID) | ORAL | 0 refills | Status: DC | PRN

## 2019-09-24 NOTE — ED Triage Notes (Signed)
Chest pain across his chest since earlier this evening, denies sob.  Pt with recent surgery to artery to left leg on 3/15

## 2019-09-24 NOTE — ED Provider Notes (Signed)
[facility] EMERGENCY DEPARTMENT Provider Note   CSN: [id number] Arrival date & time: 09/24/19  2221     History Chief Complaint  Patient presents with  . Chest Pain    Anthony [place] Isley Zinni. is a 60 y.o. male.  HPI Patient presents with chest pain.  Began earlier this evening after eating.  Has had recent femorofemoral bypass by Dr. Eden Lathe.  Had follow-up today for some cellulitis and swelling in the groin area.  Is on antibiotics.  Also his clonidine to take as needed for high blood pressure.  States he developed shortness of breath and pain across his chest.  Is dull.  Is pressure.  States gets somewhat severe at times.  States it is gotten worse when he had to go up and down the stairs to get to his house.  Somewhat improved right now but still had the pain.  States he feels more short of breath.  Not on any anticoagulation except for baby aspirin.  States that he had a negative stress test prior to the operation.    Past Medical History:  Diagnosis Date  . Back pain   . Hyperlipidemia   . PAD (peripheral artery disease) ([place])   . Stenosis of iliac artery [facility])     Patient Active Problem List   Diagnosis Date Noted  . Iliac artery stenosis, left ([place]) 09/14/2019  . AAA (abdominal aortic aneurysm) without rupture ([place]) 07/07/2019  . Peripheral arterial disease ([place]) 07/07/2019  . Tobacco abuse 07/07/2019  . Hyperlipidemia 07/07/2019  . Preoperative clearance 07/07/2019    Past Surgical History:  Procedure Laterality Date  . ABDOMINAL AORTOGRAM W/LOWER EXTREMITY N/A 06/05/2019   Procedure: ABDOMINAL AORTOGRAM W/LOWER EXTREMITY;  Surgeon: Elam Dutch, MD;  Location: [facility] [facility];  Service: Cardiovascular;  Laterality: N/A;  . BACK SURGERY    . FEMORAL ARTERY - FEMORAL ARTERY BYPASS GRAFT  09/14/2019  . FEMORAL-FEMORAL BYPASS GRAFT Bilateral 09/14/2019   Procedure: LEFT TO RIGHT BYPASS GRAFT FEMORAL-FEMORAL ARTERY USING HEMASHIELD GOLD GRAFT;  Surgeon: Elam Dutch, MD;  Location: [place] [facility];  Service: Vascular;  Laterality: Bilateral;  . PERIPHERAL VASCULAR BALLOON ANGIOPLASTY Left 06/05/2019   Procedure: PERIPHERAL VASCULAR BALLOON ANGIOPLASTY;  Surgeon: Elam Dutch, MD;  Location: [place] [facility];  Service: Cardiovascular;  Laterality: Left;  Iliac  . TONSILLECTOMY         Family History  Problem Relation Age of Onset  . Diabetes Mother   . Cancer Father     Social History   Tobacco Use  . Smoking status: Current Every Day Smoker    Packs/day: 2.00    Types: Cigarettes  . Smokeless tobacco: Never Used  Substance Use Topics  . Alcohol use: Yes    Comment: socially  . Drug use: Not Currently    Home Medications Prior to Admission medications   Medication Sig Start Date End Date Taking? Authorizing Provider  aspirin EC 81 MG tablet Take 81 mg by mouth at bedtime.    [provider]  cephALEXin (KEFLEX) 500 MG capsule Take 1 capsule (500 mg total) by mouth 2 (two) times daily. 09/24/19   Dagoberto Ligas, PA-C  cloNIDine (CATAPRES) 0.2 MG tablet SMARTSIG:1 Tablet(s) By Mouth 1 to 3 Times Daily 09/22/19   [provider]  oxyCODONE-acetaminophen (PERCOCET/ROXICET) 5-325 MG tablet Take 1 tablet by mouth every 6 (six) hours as needed. 09/24/19   Dagoberto Ligas, PA-C  simvastatin (ZOCOR) 20 MG tablet Take 20 mg by mouth  at bedtime.    [provider]  tamsulosin (FLOMAX) 0.4 MG CAPS capsule Take 0.4 mg by mouth at bedtime. 01/27/19   [provider]    Allergies    Cymbalta [duloxetine hcl]  Review of Systems   Review of Systems  Constitutional: Negative for appetite change and fever.  HENT: Negative for congestion.   Respiratory: Positive for shortness of breath.   Cardiovascular: Positive for chest pain. Negative for leg swelling.  Gastrointestinal: Negative for abdominal pain.  Genitourinary: Negative for flank pain.  Musculoskeletal: Negative for back pain.  Skin: Positive for rash.   Neurological: Negative for weakness.  Psychiatric/Behavioral: Negative for confusion.    Physical Exam Updated Vital Signs BP (!) 157/84   Pulse 82   Temp 97.8 F (36.6 C) (Oral)   Resp 19   Ht 5\' 9"  (1.753 m)   Wt 106.6 kg   SpO2 100%   BMI 34.70 kg/m   Physical Exam Vitals and nursing note reviewed.  HENT:     Head: Normocephalic.  Cardiovascular:     Rate and Rhythm: Normal rate and regular rhythm.  Pulmonary:     Breath sounds: No wheezing or rhonchi.  Chest:     Chest wall: No tenderness.  Abdominal:     Tenderness: There is abdominal tenderness.     Comments: Suprapubic to lower abdominal tenderness with mild swelling of the skin.  Musculoskeletal:        General: Normal range of motion.     Cervical back: Neck supple.     Right lower leg: Edema present.     Left lower leg: Edema present.  Skin:    General: Skin is warm.     Capillary Refill: Capillary refill takes less than 2 seconds.     Comments: Swelling in the groin area with some erythema.  Healing sites of vascular bypass.  Neurological:     Mental Status: He is alert and oriented to person, place, and time.     ED Results / Procedures / Treatments   Labs (all labs ordered are listed, but only abnormal results are displayed) Labs Reviewed  CBC - Abnormal; Notable for the following components:      Result Value   WBC 12.3 (*)    All other components within normal limits  BASIC METABOLIC PANEL  TROPONIN I (HIGH SENSITIVITY)    EKG EKG Interpretation  Date/Time:  Thursday September 24 2019 22:30:24 EDT Ventricular Rate:  83 PR Interval:    QRS Duration: 104 QT Interval:  358 QTC Calculation: 421 R Axis:   -24 Text Interpretation: Sinus rhythm Ventricular premature complex Borderline left axis deviation Abnormal R-wave progression, early transition baseline artifact Confirmed by 01-08-1973 [phone]) on 09/24/2019 10:37:04 PM   Radiology No results found.  Procedures Procedures  (including critical care time)  Medications Ordered in ED Medications - No data to display  ED Course  I have reviewed the triage vital signs and the nursing notes.  Pertinent labs & imaging results that were available during my care of the patient were reviewed by me and considered in my medical decision making (see chart for details).    MDM Rules/Calculators/A&P                      Patient with chest pain and shortness of breath post recent femorofemoral bypass.  EKG reassuring.  Had recent negative stress test prior to the recent surgery.  Not on anticoagulation however.  Will  require CTA for PE after initial lab work comes back.  Believe he is high risk for pulmonary embolism.  Patient is also somewhat hypertensive at home but blood pressure improved here.  Care turned over to Dr. Preston Fleeting Final Clinical Impression(s) / ED Diagnoses Final diagnoses:  None    Rx / DC Orders ED Discharge Orders    None       Benjiman Core, MD 09/24/19 2312

## 2019-09-24 NOTE — ED Notes (Signed)
EDP in room  

## 2019-09-24 NOTE — Progress Notes (Signed)
    Postoperative Visit   History of Present Illness   Anthony Sennett. is a 59 y.o. year old male who presents for postoperative follow-up for R to L femoral to femoral bypass on 09/14/19 by Dr. Darrick Penna.  He returns to clinic today to evaluate painful left groin with swelling along tunneled track and scrotal edema.  He denies any rest pain of bilateral lower extremities.  He denies any purulent drainage from groin incisions however has had some spots bleeding on his underwear from areas of his incision.  He did have a low-grade fever this past weekend however this resolved spontaneously.  He denies any high-grade fever, chills, nausea/vomiting.   For VQI Use Only   PRE-ADM LIVING: Home  AMB STATUS: Ambulatory   Physical Examination   Vitals:   09/24/19 1520  BP: 133/85  Resp: 16  Temp: (!) 97.3 F (36.3 C)  TempSrc: Oral  SpO2: 98%  Weight: 239 lb 1.6 oz (108.5 kg)  Height: 5\' 9"  (1.753 m)    BLE: Groin incisions are intact; some surrounding erythema of left groin incision painful to touch however no drainage; tunneled bypass tract painful to touch with edema which extends into the scrotum; palpable left anterior tibial artery pulse; brisk right DP signal as well as a brisk femoral to femoral bypass graft Doppler signal    Medical Decision Making   Anthony Hashemi. is a 59 y.o. year old male who presents s/p right to left femoral to femoral bypass due to debilitating left lower extremity claudication 10 days out from surgery.  . Patent femoral to femoral bypass with well-perfused feet . Bypass tunneled track edematous extending into scrotum; recommended athletic garment for support . Left groin incision with some surrounding erythema however no purulence; will prescribe 2 weeks of Keflex . Encouraged ambulation . Recheck groin incisions in 2 weeks with Dr. 58 or with PA on Dr. Darrick Penna clinic day . Call/return to office sooner if symptoms worsen . I also  prescribed number 25/325 mg Percocet for continued postoperative pain control   Darrick Penna PA-C [facility] Office: [phone]  Clinic MD: Dr. [phone] was involved in the evaluation and management plan of this patient

## 2019-09-24 NOTE — ED Notes (Signed)
Pt takes baby asa daily, denies taking today

## 2019-09-25 ENCOUNTER — Emergency Department (HOSPITAL_COMMUNITY): Payer: Medicare Other

## 2019-09-25 DIAGNOSIS — R079 Chest pain, unspecified: Secondary | ICD-10-CM | POA: Diagnosis not present

## 2019-09-25 DIAGNOSIS — I739 Peripheral vascular disease, unspecified: Secondary | ICD-10-CM | POA: Diagnosis not present

## 2019-09-25 DIAGNOSIS — R600 Localized edema: Secondary | ICD-10-CM | POA: Diagnosis not present

## 2019-09-25 DIAGNOSIS — I209 Angina pectoris, unspecified: Secondary | ICD-10-CM | POA: Diagnosis not present

## 2019-09-25 DIAGNOSIS — I10 Essential (primary) hypertension: Secondary | ICD-10-CM | POA: Diagnosis not present

## 2019-09-25 DIAGNOSIS — Z7982 Long term (current) use of aspirin: Secondary | ICD-10-CM | POA: Diagnosis not present

## 2019-09-25 DIAGNOSIS — Z720 Tobacco use: Secondary | ICD-10-CM | POA: Diagnosis not present

## 2019-09-25 DIAGNOSIS — E785 Hyperlipidemia, unspecified: Secondary | ICD-10-CM | POA: Diagnosis not present

## 2019-09-25 DIAGNOSIS — Z48812 Encounter for surgical aftercare following surgery on the circulatory system: Secondary | ICD-10-CM | POA: Diagnosis not present

## 2019-09-25 DIAGNOSIS — T82322A Displacement of femoral arterial graft (bypass), initial encounter: Secondary | ICD-10-CM | POA: Diagnosis not present

## 2019-09-25 DIAGNOSIS — I708 Atherosclerosis of other arteries: Secondary | ICD-10-CM | POA: Diagnosis not present

## 2019-09-25 LAB — TROPONIN I (HIGH SENSITIVITY): Troponin I (High Sensitivity): 9 ng/L (ref <18)

## 2019-09-25 MED ORDER — IOHEXOL 350 MG/ML SOLN
100.0000 mL | Freq: Once | INTRAVENOUS | Status: AC | PRN
  Administered 2019-09-25: 100 mL via INTRAVENOUS

## 2019-09-25 NOTE — Discharge Instructions (Addendum)
Your test today showed no signs of a heart attack and no signs of blood clots in your lungs.  However, the cause of your pain was not clear.  Please continue your current regimen of medication and follow-up with both your primary care provider and your vascular surgeon.  Return to the emergency department if you have any concerning symptoms.

## 2019-09-25 NOTE — ED Provider Notes (Signed)
Care assumed from Dr. Rubin Payor, patient with chest pain and shortness of breath, currently postop femoral-femoral bypass.  ECG had no acute changes, chest x-ray was normal, troponin is normal x2.  Because of recent surgery, he was felt to be at risk for pulmonary embolism so was sent for CT angiogram of the chest which also showed no acute changes.  At this point, patient states that he is feeling much better.  In addition, he had negative nuclear stress test in January.  He is felt to be safe for discharge with follow-up with his primary care provider, vascular surgeon, cardiologist.   Anthony Booze, MD 09/25/19 0236

## 2019-09-28 ENCOUNTER — Other Ambulatory Visit (HOSPITAL_COMMUNITY): Payer: Self-pay | Admitting: Internal Medicine

## 2019-09-28 ENCOUNTER — Other Ambulatory Visit: Payer: Self-pay | Admitting: Internal Medicine

## 2019-09-28 ENCOUNTER — Telehealth: Payer: Self-pay

## 2019-09-28 DIAGNOSIS — I739 Peripheral vascular disease, unspecified: Secondary | ICD-10-CM | POA: Diagnosis not present

## 2019-09-28 DIAGNOSIS — Z48812 Encounter for surgical aftercare following surgery on the circulatory system: Secondary | ICD-10-CM | POA: Diagnosis not present

## 2019-09-28 DIAGNOSIS — T82322A Displacement of femoral arterial graft (bypass), initial encounter: Secondary | ICD-10-CM | POA: Diagnosis not present

## 2019-09-28 DIAGNOSIS — I708 Atherosclerosis of other arteries: Secondary | ICD-10-CM | POA: Diagnosis not present

## 2019-09-28 DIAGNOSIS — I10 Essential (primary) hypertension: Secondary | ICD-10-CM | POA: Diagnosis not present

## 2019-09-28 DIAGNOSIS — Z7982 Long term (current) use of aspirin: Secondary | ICD-10-CM | POA: Diagnosis not present

## 2019-09-28 DIAGNOSIS — N5089 Other specified disorders of the male genital organs: Secondary | ICD-10-CM

## 2019-09-28 DIAGNOSIS — E785 Hyperlipidemia, unspecified: Secondary | ICD-10-CM | POA: Diagnosis not present

## 2019-09-28 DIAGNOSIS — Z720 Tobacco use: Secondary | ICD-10-CM | POA: Diagnosis not present

## 2019-09-28 NOTE — Telephone Encounter (Signed)
Pt c/o "numb" feeling around R calf. Denies pain, swelling, redness. He is unsure when this started, as he just noticed it recently when pulling up his sock. Pt was seen in office last week and is f/u in 2 weeks. He will let us know if anything changes or worsens.

## 2019-10-01 ENCOUNTER — Encounter: Payer: Medicare Other | Admitting: Vascular Surgery

## 2019-10-01 DIAGNOSIS — Z48812 Encounter for surgical aftercare following surgery on the circulatory system: Secondary | ICD-10-CM | POA: Diagnosis not present

## 2019-10-01 DIAGNOSIS — E785 Hyperlipidemia, unspecified: Secondary | ICD-10-CM | POA: Diagnosis not present

## 2019-10-01 DIAGNOSIS — Z7982 Long term (current) use of aspirin: Secondary | ICD-10-CM | POA: Diagnosis not present

## 2019-10-01 DIAGNOSIS — Z720 Tobacco use: Secondary | ICD-10-CM | POA: Diagnosis not present

## 2019-10-01 DIAGNOSIS — T82322A Displacement of femoral arterial graft (bypass), initial encounter: Secondary | ICD-10-CM | POA: Diagnosis not present

## 2019-10-01 DIAGNOSIS — I708 Atherosclerosis of other arteries: Secondary | ICD-10-CM | POA: Diagnosis not present

## 2019-10-01 DIAGNOSIS — I739 Peripheral vascular disease, unspecified: Secondary | ICD-10-CM | POA: Diagnosis not present

## 2019-10-01 DIAGNOSIS — I10 Essential (primary) hypertension: Secondary | ICD-10-CM | POA: Diagnosis not present

## 2019-10-02 ENCOUNTER — Other Ambulatory Visit: Payer: Self-pay

## 2019-10-02 ENCOUNTER — Ambulatory Visit (HOSPITAL_COMMUNITY)
Admission: RE | Admit: 2019-10-02 | Discharge: 2019-10-02 | Disposition: A | Discharge status: Home / Self Care | Payer: Medicare Other | Source: Ambulatory Visit | Attending: Internal Medicine | Admitting: Internal Medicine

## 2019-10-02 DIAGNOSIS — N5089 Other specified disorders of the male genital organs: Secondary | ICD-10-CM | POA: Diagnosis not present

## 2019-10-05 ENCOUNTER — Other Ambulatory Visit: Payer: Self-pay

## 2019-10-05 DIAGNOSIS — I739 Peripheral vascular disease, unspecified: Secondary | ICD-10-CM | POA: Diagnosis not present

## 2019-10-05 DIAGNOSIS — Z7982 Long term (current) use of aspirin: Secondary | ICD-10-CM | POA: Diagnosis not present

## 2019-10-05 DIAGNOSIS — Z48812 Encounter for surgical aftercare following surgery on the circulatory system: Secondary | ICD-10-CM | POA: Diagnosis not present

## 2019-10-05 DIAGNOSIS — I10 Essential (primary) hypertension: Secondary | ICD-10-CM | POA: Diagnosis not present

## 2019-10-05 DIAGNOSIS — I708 Atherosclerosis of other arteries: Secondary | ICD-10-CM | POA: Diagnosis not present

## 2019-10-05 DIAGNOSIS — T82322A Displacement of femoral arterial graft (bypass), initial encounter: Secondary | ICD-10-CM | POA: Diagnosis not present

## 2019-10-05 DIAGNOSIS — E785 Hyperlipidemia, unspecified: Secondary | ICD-10-CM | POA: Diagnosis not present

## 2019-10-05 DIAGNOSIS — Z720 Tobacco use: Secondary | ICD-10-CM | POA: Diagnosis not present

## 2019-10-06 ENCOUNTER — Telehealth: Payer: Self-pay

## 2019-10-06 DIAGNOSIS — I10 Essential (primary) hypertension: Secondary | ICD-10-CM | POA: Diagnosis not present

## 2019-10-06 DIAGNOSIS — R600 Localized edema: Secondary | ICD-10-CM | POA: Diagnosis not present

## 2019-10-06 DIAGNOSIS — I209 Angina pectoris, unspecified: Secondary | ICD-10-CM | POA: Diagnosis not present

## 2019-10-06 DIAGNOSIS — I739 Peripheral vascular disease, unspecified: Secondary | ICD-10-CM | POA: Diagnosis not present

## 2019-10-06 NOTE — Telephone Encounter (Signed)
Returned pt's call regarding calling in rx for more pain medicine. Left him a VM.

## 2019-10-07 DIAGNOSIS — Z720 Tobacco use: Secondary | ICD-10-CM | POA: Diagnosis not present

## 2019-10-07 DIAGNOSIS — I739 Peripheral vascular disease, unspecified: Secondary | ICD-10-CM | POA: Diagnosis not present

## 2019-10-07 DIAGNOSIS — Z48812 Encounter for surgical aftercare following surgery on the circulatory system: Secondary | ICD-10-CM | POA: Diagnosis not present

## 2019-10-07 DIAGNOSIS — Z7982 Long term (current) use of aspirin: Secondary | ICD-10-CM | POA: Diagnosis not present

## 2019-10-07 DIAGNOSIS — T82322A Displacement of femoral arterial graft (bypass), initial encounter: Secondary | ICD-10-CM | POA: Diagnosis not present

## 2019-10-07 DIAGNOSIS — I10 Essential (primary) hypertension: Secondary | ICD-10-CM | POA: Diagnosis not present

## 2019-10-07 DIAGNOSIS — E785 Hyperlipidemia, unspecified: Secondary | ICD-10-CM | POA: Diagnosis not present

## 2019-10-07 DIAGNOSIS — I708 Atherosclerosis of other arteries: Secondary | ICD-10-CM | POA: Diagnosis not present

## 2019-10-08 ENCOUNTER — Telehealth: Payer: Self-pay | Admitting: *Deleted

## 2019-10-08 NOTE — Telephone Encounter (Signed)
Patient called left message requesting more pain medication. Patient had procedure on 09/14/19 and was given pain medication at discharge. He was seen in follow up on 09/24/19 by Aggie Moats PA and given a refill on his pain medication. Spoke with Susy Frizzle he recommends patient try tylenol for pain relief and keep his appt with Dr Darrick Penna next week. Spoke with patient reveiwed instructions patient verbalized understanding.

## 2019-10-14 DIAGNOSIS — I739 Peripheral vascular disease, unspecified: Secondary | ICD-10-CM | POA: Diagnosis not present

## 2019-10-14 DIAGNOSIS — I10 Essential (primary) hypertension: Secondary | ICD-10-CM | POA: Diagnosis not present

## 2019-10-14 DIAGNOSIS — Z7982 Long term (current) use of aspirin: Secondary | ICD-10-CM | POA: Diagnosis not present

## 2019-10-14 DIAGNOSIS — Z720 Tobacco use: Secondary | ICD-10-CM | POA: Diagnosis not present

## 2019-10-14 DIAGNOSIS — Z48812 Encounter for surgical aftercare following surgery on the circulatory system: Secondary | ICD-10-CM | POA: Diagnosis not present

## 2019-10-14 DIAGNOSIS — E785 Hyperlipidemia, unspecified: Secondary | ICD-10-CM | POA: Diagnosis not present

## 2019-10-14 DIAGNOSIS — I708 Atherosclerosis of other arteries: Secondary | ICD-10-CM | POA: Diagnosis not present

## 2019-10-14 DIAGNOSIS — T82322A Displacement of femoral arterial graft (bypass), initial encounter: Secondary | ICD-10-CM | POA: Diagnosis not present

## 2019-10-15 ENCOUNTER — Ambulatory Visit (INDEPENDENT_AMBULATORY_CARE_PROVIDER_SITE_OTHER): Payer: Self-pay | Admitting: Physician Assistant

## 2019-10-15 ENCOUNTER — Other Ambulatory Visit: Payer: Self-pay

## 2019-10-15 VITALS — BP 152/91 | HR 90 | Temp 98.6°F | Resp 18 | Ht 69.0 in | Wt 244.7 lb

## 2019-10-15 DIAGNOSIS — I739 Peripheral vascular disease, unspecified: Secondary | ICD-10-CM

## 2019-10-15 DIAGNOSIS — R0789 Other chest pain: Secondary | ICD-10-CM

## 2019-10-15 NOTE — Progress Notes (Signed)
    Postoperative Visit   History of Present Illness   Anthony Todd. is a 59 y.o. year old male who presents for postoperative follow-up for R to L femoral to femoral bypass on 09/14/19 by Dr. Darrick Penna.  He had some scrotal edema and L groin incision dehiscence and erythema noted during last office visit.  Scrotal edema now resolved.  R groin incision is well healed.  L groin incision is nearly healed with small superficial open area in distal incision.  He denies rest pain or tissue changes of BLE.  He completed his 2 week kelfex regimen.    Patient is however experiencing episodes of chest fluttering and pressure that radiates to both shoulders.  This is mainly brough on with exertion and can last anywhere from a few seconds to a few minutes.  It occurs once every few days.  He checks his blood pressure when this episode occurs and systolic blood pressure is usually elevated >170.  He went to the emergency department during an episode and coronary workup was negative.  Chest CT also negative for PE.   The patient is worried he is going to have a stroke because several people close to him told him that these symptoms usually lead to a stroke.  His PCP has prescribed him valium to calm his nerves.  For VQI Use Only   PRE-ADM LIVING: Home  AMB STATUS: Ambulatory   Physical Examination   Vitals:   10/15/19 0930  BP: (!) 152/91  Pulse: 90  Resp: 18  Temp: 98.6 F (37 C)  SpO2: 100%  Weight: 244 lb 11.2 oz (111 kg)  Height: 5\' 9"  (1.753 m)    BLE: R groin incision well healed; L groin incision nearly healed with small superficial open area in mid to distal groin incision; brisk AT and PT signals by doppler bilaterally; some residual penile and scrotal edema   Medical Decision Making   [name] is a 59 y.o. year old male who presents 1 month s/p R to L femoral to femoral bypass  . Patent femoral to femoral bypass with brisk pedal doppler signals bilaterally . L  groin incision nearly healed, no obvious sign of infection; encouraged at least daily cleansing with soap and water and to keep dry . Concerning chest fluttering/pressure radiating to shoulders accompanied by wide blood pressure swings; patient will be referred back to Cardiologist for evaluation . Agree with short course of valium for anxiety; patient appears to be very worried about a number of different stressors; from a surgical standpoint I assured patient that BLE are well perfused and groin incisions are healing well and are much improved compared to last office visit . Follow up in 2-3 weeks for graft surveillance and ABIs   59 PA-C [facility] Office: [phone]  Clinic MD: [phone]

## 2019-10-16 ENCOUNTER — Telehealth: Payer: Self-pay | Admitting: Cardiovascular Disease

## 2019-10-16 ENCOUNTER — Other Ambulatory Visit: Payer: Self-pay | Admitting: *Deleted

## 2019-10-16 DIAGNOSIS — Z48812 Encounter for surgical aftercare following surgery on the circulatory system: Secondary | ICD-10-CM | POA: Diagnosis not present

## 2019-10-16 DIAGNOSIS — I771 Stricture of artery: Secondary | ICD-10-CM

## 2019-10-16 DIAGNOSIS — T82322A Displacement of femoral arterial graft (bypass), initial encounter: Secondary | ICD-10-CM | POA: Diagnosis not present

## 2019-10-16 DIAGNOSIS — I739 Peripheral vascular disease, unspecified: Secondary | ICD-10-CM

## 2019-10-16 DIAGNOSIS — I10 Essential (primary) hypertension: Secondary | ICD-10-CM | POA: Diagnosis not present

## 2019-10-16 DIAGNOSIS — I708 Atherosclerosis of other arteries: Secondary | ICD-10-CM | POA: Diagnosis not present

## 2019-10-16 DIAGNOSIS — Z7982 Long term (current) use of aspirin: Secondary | ICD-10-CM | POA: Diagnosis not present

## 2019-10-16 DIAGNOSIS — Z720 Tobacco use: Secondary | ICD-10-CM | POA: Diagnosis not present

## 2019-10-16 DIAGNOSIS — E785 Hyperlipidemia, unspecified: Secondary | ICD-10-CM | POA: Diagnosis not present

## 2019-10-16 NOTE — Telephone Encounter (Signed)
Anthony Todd would like to switch Providers from Dr. Allyson Sabal to Dr. Ladona Ridgel in [place] so he doesn't have to travel to [facility] for appointments.

## 2019-10-16 NOTE — Telephone Encounter (Signed)
That is fine with me.

## 2019-10-20 DIAGNOSIS — I209 Angina pectoris, unspecified: Secondary | ICD-10-CM | POA: Diagnosis not present

## 2019-10-20 DIAGNOSIS — I10 Essential (primary) hypertension: Secondary | ICD-10-CM | POA: Diagnosis not present

## 2019-10-20 DIAGNOSIS — R079 Chest pain, unspecified: Secondary | ICD-10-CM | POA: Diagnosis not present

## 2019-10-20 NOTE — Telephone Encounter (Signed)
Ok. GT 

## 2019-10-21 ENCOUNTER — Ambulatory Visit: Payer: Medicare Other | Admitting: Internal Medicine

## 2019-10-21 DIAGNOSIS — I739 Peripheral vascular disease, unspecified: Secondary | ICD-10-CM | POA: Diagnosis not present

## 2019-10-21 DIAGNOSIS — I10 Essential (primary) hypertension: Secondary | ICD-10-CM | POA: Diagnosis not present

## 2019-10-21 DIAGNOSIS — Z7982 Long term (current) use of aspirin: Secondary | ICD-10-CM | POA: Diagnosis not present

## 2019-10-21 DIAGNOSIS — Z48812 Encounter for surgical aftercare following surgery on the circulatory system: Secondary | ICD-10-CM | POA: Diagnosis not present

## 2019-10-21 DIAGNOSIS — T82322A Displacement of femoral arterial graft (bypass), initial encounter: Secondary | ICD-10-CM | POA: Diagnosis not present

## 2019-10-21 DIAGNOSIS — E785 Hyperlipidemia, unspecified: Secondary | ICD-10-CM | POA: Diagnosis not present

## 2019-10-21 DIAGNOSIS — Z720 Tobacco use: Secondary | ICD-10-CM | POA: Diagnosis not present

## 2019-10-21 DIAGNOSIS — I708 Atherosclerosis of other arteries: Secondary | ICD-10-CM | POA: Diagnosis not present

## 2019-10-23 ENCOUNTER — Other Ambulatory Visit: Payer: Self-pay

## 2019-10-23 ENCOUNTER — Inpatient Hospital Stay (HOSPITAL_COMMUNITY)
Admission: EM | Admit: 2019-10-23 | Discharge: 2019-10-27 | DRG: 247 | Disposition: A | Discharge status: Home / Self Care | Payer: Medicare Other | Attending: Internal Medicine | Admitting: Internal Medicine

## 2019-10-23 ENCOUNTER — Emergency Department (HOSPITAL_COMMUNITY): Payer: Medicare Other

## 2019-10-23 ENCOUNTER — Telehealth: Payer: Self-pay

## 2019-10-23 DIAGNOSIS — Z95820 Peripheral vascular angioplasty status with implants and grafts: Secondary | ICD-10-CM

## 2019-10-23 DIAGNOSIS — I25110 Atherosclerotic heart disease of native coronary artery with unstable angina pectoris: Secondary | ICD-10-CM | POA: Diagnosis present

## 2019-10-23 DIAGNOSIS — Z79899 Other long term (current) drug therapy: Secondary | ICD-10-CM

## 2019-10-23 DIAGNOSIS — E785 Hyperlipidemia, unspecified: Secondary | ICD-10-CM | POA: Diagnosis not present

## 2019-10-23 DIAGNOSIS — G249 Dystonia, unspecified: Secondary | ICD-10-CM | POA: Diagnosis not present

## 2019-10-23 DIAGNOSIS — Z955 Presence of coronary angioplasty implant and graft: Secondary | ICD-10-CM

## 2019-10-23 DIAGNOSIS — I214 Non-ST elevation (NSTEMI) myocardial infarction: Principal | ICD-10-CM

## 2019-10-23 DIAGNOSIS — F17210 Nicotine dependence, cigarettes, uncomplicated: Secondary | ICD-10-CM | POA: Diagnosis present

## 2019-10-23 DIAGNOSIS — R7303 Prediabetes: Secondary | ICD-10-CM | POA: Diagnosis not present

## 2019-10-23 DIAGNOSIS — I10 Essential (primary) hypertension: Secondary | ICD-10-CM | POA: Diagnosis not present

## 2019-10-23 DIAGNOSIS — Z720 Tobacco use: Secondary | ICD-10-CM | POA: Diagnosis not present

## 2019-10-23 DIAGNOSIS — I739 Peripheral vascular disease, unspecified: Secondary | ICD-10-CM | POA: Diagnosis present

## 2019-10-23 DIAGNOSIS — Z888 Allergy status to other drugs, medicaments and biological substances status: Secondary | ICD-10-CM | POA: Diagnosis not present

## 2019-10-23 DIAGNOSIS — T82322A Displacement of femoral arterial graft (bypass), initial encounter: Secondary | ICD-10-CM | POA: Diagnosis not present

## 2019-10-23 DIAGNOSIS — Z833 Family history of diabetes mellitus: Secondary | ICD-10-CM | POA: Diagnosis not present

## 2019-10-23 DIAGNOSIS — I70201 Unspecified atherosclerosis of native arteries of extremities, right leg: Secondary | ICD-10-CM | POA: Diagnosis present

## 2019-10-23 DIAGNOSIS — T8131XA Disruption of external operation (surgical) wound, not elsewhere classified, initial encounter: Secondary | ICD-10-CM | POA: Diagnosis present

## 2019-10-23 DIAGNOSIS — I708 Atherosclerosis of other arteries: Secondary | ICD-10-CM | POA: Diagnosis not present

## 2019-10-23 DIAGNOSIS — I2510 Atherosclerotic heart disease of native coronary artery without angina pectoris: Secondary | ICD-10-CM | POA: Diagnosis not present

## 2019-10-23 DIAGNOSIS — E669 Obesity, unspecified: Secondary | ICD-10-CM | POA: Diagnosis present

## 2019-10-23 DIAGNOSIS — I209 Angina pectoris, unspecified: Secondary | ICD-10-CM | POA: Diagnosis not present

## 2019-10-23 DIAGNOSIS — R079 Chest pain, unspecified: Secondary | ICD-10-CM | POA: Diagnosis not present

## 2019-10-23 DIAGNOSIS — Z48812 Encounter for surgical aftercare following surgery on the circulatory system: Secondary | ICD-10-CM | POA: Diagnosis not present

## 2019-10-23 DIAGNOSIS — Z7982 Long term (current) use of aspirin: Secondary | ICD-10-CM | POA: Diagnosis not present

## 2019-10-23 DIAGNOSIS — Z20822 Contact with and (suspected) exposure to covid-19: Secondary | ICD-10-CM | POA: Diagnosis present

## 2019-10-23 DIAGNOSIS — Y838 Other surgical procedures as the cause of abnormal reaction of the patient, or of later complication, without mention of misadventure at the time of the procedure: Secondary | ICD-10-CM | POA: Diagnosis present

## 2019-10-23 DIAGNOSIS — Z6836 Body mass index (BMI) 36.0-36.9, adult: Secondary | ICD-10-CM

## 2019-10-23 DIAGNOSIS — R0789 Other chest pain: Secondary | ICD-10-CM | POA: Diagnosis not present

## 2019-10-23 DIAGNOSIS — R0602 Shortness of breath: Secondary | ICD-10-CM | POA: Diagnosis not present

## 2019-10-23 HISTORY — DX: Abdominal aortic aneurysm, without rupture: I71.4

## 2019-10-23 HISTORY — DX: Abdominal aortic aneurysm, without rupture, unspecified: I71.40

## 2019-10-23 LAB — CBC
HCT: 46 % (ref 39.0–52.0)
Hemoglobin: 15.4 g/dL (ref 13.0–17.0)
MCH: 30 pg (ref 26.0–34.0)
MCHC: 33.5 g/dL (ref 30.0–36.0)
MCV: 89.7 fL (ref 80.0–100.0)
Platelets: 211 10*3/uL (ref 150–400)
RBC: 5.13 MIL/uL (ref 4.22–5.81)
RDW: 13.2 % (ref 11.5–15.5)
WBC: 10.3 10*3/uL (ref 4.0–10.5)
nRBC: 0 % (ref 0.0–0.2)

## 2019-10-23 LAB — BASIC METABOLIC PANEL
Anion gap: 9 (ref 5–15)
BUN: 14 mg/dL (ref 6–20)
CO2: 25 mmol/L (ref 22–32)
Calcium: 9.4 mg/dL (ref 8.9–10.3)
Chloride: 104 mmol/L (ref 98–111)
Creatinine, Ser: 0.89 mg/dL (ref 0.61–1.24)
GFR calc Af Amer: >60 mL/min (ref >60)
GFR calc non Af Amer: >60 mL/min (ref >60)
Glucose, Bld: 102 mg/dL — ABNORMAL HIGH (ref 70–99)
Potassium: 4.4 mmol/L (ref 3.5–5.1)
Sodium: 138 mmol/L (ref 135–145)

## 2019-10-23 LAB — TROPONIN I (HIGH SENSITIVITY): Troponin I (High Sensitivity): 441 ng/L (ref <18)

## 2019-10-23 NOTE — ED Triage Notes (Addendum)
Pt c/o HA and blood pressure spikes for weeks as well as intermittent CP. NAD noted.

## 2019-10-23 NOTE — ED Notes (Signed)
Date and time results received: 10/23/19 10:18 PM  Test: Troponin Critical Value: 441  Name of Provider Notified: Adela Lank

## 2019-10-23 NOTE — ED Provider Notes (Signed)
[facility] EMERGENCY DEPARTMENT Provider Note   CSN: [id number] Arrival date & time: 10/23/19  2022     History Chief Complaint  Patient presents with  . Hypertension  . Headache    Anthony Todd. is a 59 y.o. male.  Patient presents to the emergency department for evaluation of elevated blood pressure and chest pain.  Patient had left to right femorofemoral bypass on March 15.  He had a stress test just prior to that and was told everything was okay.  Since the surgery, however, he has been having intermittent episodes of chest pain and his blood pressures have been running very high.  Patient described the pain as a tightness and a squeezing in the chest with some mild shortness of breath.        Past Medical History:  Diagnosis Date  . Back pain   . Hyperlipidemia   . PAD (peripheral artery disease) (HCC)   . Stenosis of iliac artery [facility])     Patient Active Problem List   Diagnosis Date Noted  . Sensation of chest pressure 10/15/2019  . Iliac artery stenosis, left (HCC) 09/14/2019  . AAA (abdominal aortic aneurysm) without rupture (HCC) 07/07/2019  . PAD (peripheral artery disease) (HCC) 07/07/2019  . Tobacco abuse 07/07/2019  . Hyperlipidemia 07/07/2019  . Preoperative clearance 07/07/2019    Past Surgical History:  Procedure Laterality Date  . ABDOMINAL AORTOGRAM W/LOWER EXTREMITY N/A 06/05/2019   Procedure: ABDOMINAL AORTOGRAM W/LOWER EXTREMITY;  Surgeon: Sherren Kerns, MD;  Location: [facility] INVASIVE CV LAB;  Service: Cardiovascular;  Laterality: N/A;  . BACK SURGERY    . FEMORAL ARTERY - FEMORAL ARTERY BYPASS GRAFT  09/14/2019  . FEMORAL-FEMORAL BYPASS GRAFT Bilateral 09/14/2019   Procedure: LEFT TO RIGHT BYPASS GRAFT FEMORAL-FEMORAL ARTERY USING HEMASHIELD GOLD GRAFT;  Surgeon: Sherren Kerns, MD;  Location: [facility] OR;  Service: Vascular;  Laterality: Bilateral;  . PERIPHERAL VASCULAR BALLOON ANGIOPLASTY Left 06/05/2019   Procedure:  PERIPHERAL VASCULAR BALLOON ANGIOPLASTY;  Surgeon: Sherren Kerns, MD;  Location: [facility] INVASIVE CV LAB;  Service: Cardiovascular;  Laterality: Left;  Iliac  . TONSILLECTOMY         Family History  Problem Relation Age of Onset  . Diabetes Mother   . Cancer Father     Social History   Tobacco Use  . Smoking status: Current Every Day Smoker    Packs/day: 2.00    Types: Cigarettes  . Smokeless tobacco: Never Used  Substance Use Topics  . Alcohol use: Yes    Comment: socially  . Drug use: Not Currently    Home Medications Prior to Admission medications   Medication Sig Start Date End Date Taking? Authorizing Provider  aspirin EC 81 MG tablet Take 81 mg by mouth at bedtime.    [provider]  cephALEXin (KEFLEX) 500 MG capsule Take 1 capsule (500 mg total) by mouth 2 (two) times daily. 09/24/19   Emilie Rutter, PA-C  cloNIDine (CATAPRES) 0.2 MG tablet SMARTSIG:1 Tablet(s) By Mouth 1 to 3 Times Daily 09/22/19   [provider]  diazepam (VALIUM) 5 MG tablet Take 5 mg by mouth daily as needed. 10/07/19   [provider]  oxyCODONE-acetaminophen (PERCOCET/ROXICET) 5-325 MG tablet Take 1 tablet by mouth every 6 (six) hours as needed. 09/24/19   Emilie Rutter, PA-C  simvastatin (ZOCOR) 20 MG tablet Take 20 mg by mouth at bedtime.    [provider]  tamsulosin (FLOMAX) 0.4 MG CAPS capsule Take  0.4 mg by mouth at bedtime. 01/27/19   [provider]    Allergies    Cymbalta [duloxetine hcl]  Review of Systems   Review of Systems  Respiratory: Positive for chest tightness and shortness of breath.   All other systems reviewed and are negative.   Physical Exam Updated Vital Signs BP 136/90 (BP Location: Left Arm)   Pulse 87   Temp 97.6 F (36.4 C) (Oral)   Resp 18   SpO2 99%   Physical Exam Vitals and nursing note reviewed.  Constitutional:      General: He is not in acute distress.    Appearance: Normal appearance. He is  well-developed.  HENT:     Head: Normocephalic and atraumatic.     Right Ear: Hearing normal.     Left Ear: Hearing normal.     Nose: Nose normal.  Eyes:     Conjunctiva/sclera: Conjunctivae normal.     Pupils: Pupils are equal, round, and reactive to light.  Cardiovascular:     Rate and Rhythm: Regular rhythm.     Heart sounds: S1 normal and S2 normal. No murmur. No friction rub. No gallop.   Pulmonary:     Effort: Pulmonary effort is normal. No respiratory distress.     Breath sounds: Normal breath sounds.  Chest:     Chest wall: No tenderness.  Abdominal:     General: Bowel sounds are normal.     Palpations: Abdomen is soft.     Tenderness: There is no abdominal tenderness. There is no guarding or rebound. Negative signs include Murphy's sign and McBurney's sign.     Hernia: No hernia is present.  Musculoskeletal:        General: Normal range of motion.     Cervical back: Normal range of motion and neck supple.  Skin:    General: Skin is warm and dry.     Findings: No rash.  Neurological:     Mental Status: He is alert and oriented to person, place, and time.     GCS: GCS eye subscore is 4. GCS verbal subscore is 5. GCS motor subscore is 6.     Cranial Nerves: No cranial nerve deficit.     Sensory: No sensory deficit.     Coordination: Coordination normal.  Psychiatric:        Speech: Speech normal.        Behavior: Behavior normal.        Thought Content: Thought content normal.     ED Results / Procedures / Treatments   Labs (all labs ordered are listed, but only abnormal results are displayed) Labs Reviewed  BASIC METABOLIC PANEL - Abnormal; Notable for the following components:      Result Value   Glucose, Bld 102 (*)    All other components within normal limits  TROPONIN I (HIGH SENSITIVITY) - Abnormal; Notable for the following components:   Troponin I (High Sensitivity) 441 (*)    All other components within normal limits  CBC  TROPONIN I (HIGH  SENSITIVITY)    EKG None  Radiology DG Chest 2 View  Result Date: 10/23/2019 CLINICAL DATA:  Chest pain. EXAM: CHEST - 2 VIEW COMPARISON:  September 24, 2019 FINDINGS: The heart size and mediastinal contours are within normal limits. Both lungs are clear. The visualized skeletal structures are unremarkable. IMPRESSION: No active cardiopulmonary disease. Electronically Signed   By: Aram Candela M.D.   On: 10/23/2019 21:15    Procedures Procedures (including  critical care time)  Medications Ordered in ED Medications - No data to display  ED Course  I have reviewed the triage vital signs and the nursing notes.  Pertinent labs & imaging results that were available during my care of the patient were reviewed by me and considered in my medical decision making (see chart for details).    MDM Rules/Calculators/A&P                      Patient presents to the emergency department for evaluation of chest pain.  He has been experiencing intermittent episodes of chest pain associated with elevated blood pressure since he underwent left to right femoral-femoral bypass last month.  Episodes of chest pain the last for several hours and then resolved.  He was noted to have an elevated troponin at arrival.  EKG is unchanged.  Blood pressure is high normal, not high enough to cause elevated troponins.  Because he recently had surgery, CT angiography was performed.  No evidence of PE or other acute abnormality.  No evidence of congestive heart failure.  Second troponin is elevated above first, will require hospitalization for further management.  Patient was started on heparin at arrival.  He is not experiencing chest pain currently.  CRITICAL CARE Performed by: Orpah Greek   Total critical care time: 35 minutes  Critical care time was exclusive of separately billable procedures and treating other patients.  Critical care was necessary to treat or prevent imminent or life-threatening  deterioration.  Critical care was time spent personally by me on the following activities: development of treatment plan with patient and/or surrogate as well as nursing, discussions with consultants, evaluation of patient's response to treatment, examination of patient, obtaining history from patient or surrogate, ordering and performing treatments and interventions, ordering and review of laboratory studies, ordering and review of radiographic studies, pulse oximetry and re-evaluation of patient's condition.   Final Clinical Impression(s) / ED Diagnoses Final diagnoses:  None    Rx / DC Orders ED Discharge Orders    None       Emmalia Heyboer, Gwenyth Allegra, MD 10/24/19 0202

## 2019-10-23 NOTE — Telephone Encounter (Signed)
Pt has questions to ask before upcoming appt RJ:JOACZYSA he is having now Pt informed 11-05-19 is first available with Dr.Taylor in [place].  Please call  [phone]   Thanks renee

## 2019-10-23 NOTE — Telephone Encounter (Signed)
Called pt. No answer, lmtcb 

## 2019-10-24 ENCOUNTER — Emergency Department (HOSPITAL_COMMUNITY): Payer: Medicare Other

## 2019-10-24 ENCOUNTER — Inpatient Hospital Stay (HOSPITAL_COMMUNITY): Payer: Medicare Other

## 2019-10-24 DIAGNOSIS — Z888 Allergy status to other drugs, medicaments and biological substances status: Secondary | ICD-10-CM | POA: Diagnosis not present

## 2019-10-24 DIAGNOSIS — I70201 Unspecified atherosclerosis of native arteries of extremities, right leg: Secondary | ICD-10-CM | POA: Diagnosis present

## 2019-10-24 DIAGNOSIS — Z95820 Peripheral vascular angioplasty status with implants and grafts: Secondary | ICD-10-CM | POA: Diagnosis not present

## 2019-10-24 DIAGNOSIS — I214 Non-ST elevation (NSTEMI) myocardial infarction: Principal | ICD-10-CM

## 2019-10-24 DIAGNOSIS — I10 Essential (primary) hypertension: Secondary | ICD-10-CM

## 2019-10-24 DIAGNOSIS — E669 Obesity, unspecified: Secondary | ICD-10-CM | POA: Diagnosis present

## 2019-10-24 DIAGNOSIS — Z6836 Body mass index (BMI) 36.0-36.9, adult: Secondary | ICD-10-CM | POA: Diagnosis not present

## 2019-10-24 DIAGNOSIS — Z833 Family history of diabetes mellitus: Secondary | ICD-10-CM | POA: Diagnosis not present

## 2019-10-24 DIAGNOSIS — Z7982 Long term (current) use of aspirin: Secondary | ICD-10-CM | POA: Diagnosis not present

## 2019-10-24 DIAGNOSIS — I25110 Atherosclerotic heart disease of native coronary artery with unstable angina pectoris: Secondary | ICD-10-CM | POA: Diagnosis present

## 2019-10-24 DIAGNOSIS — Y838 Other surgical procedures as the cause of abnormal reaction of the patient, or of later complication, without mention of misadventure at the time of the procedure: Secondary | ICD-10-CM | POA: Diagnosis present

## 2019-10-24 DIAGNOSIS — R7303 Prediabetes: Secondary | ICD-10-CM | POA: Diagnosis present

## 2019-10-24 DIAGNOSIS — I739 Peripheral vascular disease, unspecified: Secondary | ICD-10-CM | POA: Diagnosis not present

## 2019-10-24 DIAGNOSIS — E785 Hyperlipidemia, unspecified: Secondary | ICD-10-CM | POA: Diagnosis not present

## 2019-10-24 DIAGNOSIS — Z20822 Contact with and (suspected) exposure to covid-19: Secondary | ICD-10-CM | POA: Diagnosis not present

## 2019-10-24 DIAGNOSIS — T8131XA Disruption of external operation (surgical) wound, not elsewhere classified, initial encounter: Secondary | ICD-10-CM | POA: Diagnosis not present

## 2019-10-24 DIAGNOSIS — G249 Dystonia, unspecified: Secondary | ICD-10-CM | POA: Diagnosis present

## 2019-10-24 DIAGNOSIS — I2510 Atherosclerotic heart disease of native coronary artery without angina pectoris: Secondary | ICD-10-CM | POA: Diagnosis not present

## 2019-10-24 DIAGNOSIS — F17210 Nicotine dependence, cigarettes, uncomplicated: Secondary | ICD-10-CM | POA: Diagnosis present

## 2019-10-24 DIAGNOSIS — R0602 Shortness of breath: Secondary | ICD-10-CM | POA: Diagnosis not present

## 2019-10-24 DIAGNOSIS — Z720 Tobacco use: Secondary | ICD-10-CM | POA: Diagnosis not present

## 2019-10-24 DIAGNOSIS — Z79899 Other long term (current) drug therapy: Secondary | ICD-10-CM | POA: Diagnosis not present

## 2019-10-24 LAB — CBC
HCT: 45.1 % (ref 39.0–52.0)
Hemoglobin: 15.6 g/dL (ref 13.0–17.0)
MCH: 30.4 pg (ref 26.0–34.0)
MCHC: 34.6 g/dL (ref 30.0–36.0)
MCV: 87.7 fL (ref 80.0–100.0)
Platelets: 185 10*3/uL (ref 150–400)
RBC: 5.14 MIL/uL (ref 4.22–5.81)
RDW: 13.1 % (ref 11.5–15.5)
WBC: 10.7 10*3/uL — ABNORMAL HIGH (ref 4.0–10.5)
nRBC: 0 % (ref 0.0–0.2)

## 2019-10-24 LAB — HEPARIN LEVEL (UNFRACTIONATED)
Heparin Unfractionated: 0.13 IU/mL — ABNORMAL LOW (ref 0.30–0.70)
Heparin Unfractionated: 0.29 IU/mL — ABNORMAL LOW (ref 0.30–0.70)

## 2019-10-24 LAB — TSH: TSH: 1.18 u[IU]/mL (ref 0.350–4.500)

## 2019-10-24 LAB — ECHOCARDIOGRAM COMPLETE
Height: 69 in
Weight: 3911.84 oz

## 2019-10-24 LAB — BASIC METABOLIC PANEL
Anion gap: 10 (ref 5–15)
BUN: 12 mg/dL (ref 6–20)
CO2: 23 mmol/L (ref 22–32)
Calcium: 9.4 mg/dL (ref 8.9–10.3)
Chloride: 107 mmol/L (ref 98–111)
Creatinine, Ser: 0.91 mg/dL (ref 0.61–1.24)
GFR calc Af Amer: >60 mL/min (ref >60)
GFR calc non Af Amer: >60 mL/min (ref >60)
Glucose, Bld: 134 mg/dL — ABNORMAL HIGH (ref 70–99)
Potassium: 3.8 mmol/L (ref 3.5–5.1)
Sodium: 140 mmol/L (ref 135–145)

## 2019-10-24 LAB — RESPIRATORY PANEL BY RT PCR (FLU A&B, COVID)
Influenza A by PCR: NEGATIVE
Influenza B by PCR: NEGATIVE
SARS Coronavirus 2 by RT PCR: NEGATIVE

## 2019-10-24 LAB — HIV ANTIBODY (ROUTINE TESTING W REFLEX): HIV Screen 4th Generation wRfx: NONREACTIVE

## 2019-10-24 LAB — T4, FREE: Free T4: 0.91 ng/dL (ref 0.61–1.12)

## 2019-10-24 LAB — BRAIN NATRIURETIC PEPTIDE: B Natriuretic Peptide: 59.6 pg/mL (ref 0.0–100.0)

## 2019-10-24 LAB — TROPONIN I (HIGH SENSITIVITY): Troponin I (High Sensitivity): 542 ng/L (ref <18)

## 2019-10-24 MED ORDER — ACETAMINOPHEN 325 MG PO TABS
650.0000 mg | ORAL_TABLET | ORAL | Status: DC | PRN
  Administered 2019-10-24: 650 mg via ORAL
  Filled 2019-10-24: qty 2

## 2019-10-24 MED ORDER — AMLODIPINE BESYLATE 5 MG PO TABS
5.0000 mg | ORAL_TABLET | Freq: Every day | ORAL | Status: DC
  Administered 2019-10-24 – 2019-10-26 (×3): 5 mg via ORAL
  Filled 2019-10-24 (×3): qty 1

## 2019-10-24 MED ORDER — ASPIRIN EC 81 MG PO TBEC
81.0000 mg | DELAYED_RELEASE_TABLET | Freq: Every day | ORAL | Status: DC
  Administered 2019-10-24 – 2019-10-26 (×3): 81 mg via ORAL
  Filled 2019-10-24 (×4): qty 1

## 2019-10-24 MED ORDER — CARVEDILOL 6.25 MG PO TABS
6.2500 mg | ORAL_TABLET | Freq: Two times a day (BID) | ORAL | Status: DC
  Administered 2019-10-24 – 2019-10-27 (×7): 6.25 mg via ORAL
  Filled 2019-10-24 (×7): qty 1

## 2019-10-24 MED ORDER — ONDANSETRON HCL 4 MG/2ML IJ SOLN: 4.0000 mg | Freq: Four times a day (QID) | INTRAMUSCULAR | Status: DC | PRN

## 2019-10-24 MED ORDER — HEPARIN BOLUS VIA INFUSION
2800.0000 [IU] | Freq: Once | INTRAVENOUS | Status: AC
  Administered 2019-10-24: 2800 [IU] via INTRAVENOUS
  Filled 2019-10-24: qty 2800

## 2019-10-24 MED ORDER — ROSUVASTATIN CALCIUM 20 MG PO TABS
20.0000 mg | ORAL_TABLET | Freq: Every day | ORAL | Status: DC
  Administered 2019-10-24 – 2019-10-27 (×4): 20 mg via ORAL
  Filled 2019-10-24 (×4): qty 1

## 2019-10-24 MED ORDER — IOHEXOL 350 MG/ML SOLN
100.0000 mL | Freq: Once | INTRAVENOUS | Status: AC | PRN
  Administered 2019-10-24: 100 mL via INTRAVENOUS

## 2019-10-24 MED ORDER — HEPARIN BOLUS VIA INFUSION
4000.0000 [IU] | Freq: Once | INTRAVENOUS | Status: AC
  Administered 2019-10-24: 4000 [IU] via INTRAVENOUS
  Filled 2019-10-24: qty 4000

## 2019-10-24 MED ORDER — TAMSULOSIN HCL 0.4 MG PO CAPS
0.4000 mg | ORAL_CAPSULE | Freq: Every day | ORAL | Status: DC
  Filled 2019-10-24 (×3): qty 1

## 2019-10-24 MED ORDER — NITROGLYCERIN 0.4 MG SL SUBL
0.4000 mg | SUBLINGUAL_TABLET | SUBLINGUAL | Status: DC | PRN
  Administered 2019-10-26: 0.4 mg via SUBLINGUAL
  Filled 2019-10-24: qty 1

## 2019-10-24 MED ORDER — HEPARIN (PORCINE) 25000 UT/250ML-% IV SOLN
1650.0000 [IU]/h | INTRAVENOUS | Status: DC
  Administered 2019-10-24: 12:00:00 1350 [IU]/h via INTRAVENOUS
  Administered 2019-10-24: 1200 [IU]/h via INTRAVENOUS
  Administered 2019-10-25: 1400 [IU]/h via INTRAVENOUS
  Filled 2019-10-24 (×4): qty 250

## 2019-10-24 MED ORDER — IRBESARTAN 150 MG PO TABS
150.0000 mg | ORAL_TABLET | Freq: Every day | ORAL | Status: DC
  Administered 2019-10-24 – 2019-10-27 (×4): 150 mg via ORAL
  Filled 2019-10-24 (×4): qty 1

## 2019-10-24 MED ORDER — NICOTINE 14 MG/24HR TD PT24
14.0000 mg | MEDICATED_PATCH | Freq: Every day | TRANSDERMAL | Status: DC
  Administered 2019-10-24 – 2019-10-27 (×5): 14 mg via TRANSDERMAL
  Filled 2019-10-24 (×5): qty 1

## 2019-10-24 MED ORDER — ISOSORBIDE MONONITRATE ER 30 MG PO TB24
30.0000 mg | ORAL_TABLET | Freq: Every day | ORAL | Status: DC
  Administered 2019-10-24 – 2019-10-27 (×4): 30 mg via ORAL
  Filled 2019-10-24 (×4): qty 1

## 2019-10-24 MED ORDER — OXYCODONE-ACETAMINOPHEN 5-325 MG PO TABS
1.0000 | ORAL_TABLET | Freq: Four times a day (QID) | ORAL | Status: DC | PRN
  Administered 2019-10-24 – 2019-10-27 (×7): 1 via ORAL
  Filled 2019-10-24 (×7): qty 1

## 2019-10-24 NOTE — Progress Notes (Signed)
Progress Note  Patient Name: Anthony Todd. Date of Encounter: 10/24/2019  Primary Cardiologist: Cristopher Peru, MD   Subjective   "I feel better."  Inpatient Medications    Scheduled Meds: . amLODipine  5 mg Oral QHS  . aspirin EC  81 mg Oral QHS  . carvedilol  6.25 mg Oral BID WC  . irbesartan  150 mg Oral Daily  . isosorbide mononitrate  30 mg Oral Daily  . rosuvastatin  20 mg Oral Daily  . tamsulosin  0.4 mg Oral QHS   Continuous Infusions: . heparin Stopped (10/24/19 1414)   PRN Meds: acetaminophen, nitroGLYCERIN, ondansetron (ZOFRAN) IV   Vital Signs    Vitals:   10/24/19 1315 10/24/19 1330 10/24/19 1449 10/24/19 1453  BP: 110/70 111/71    Pulse: 81 80 80 80  Resp:   16   Temp:    98.1 F (36.7 C)  TempSrc:    Oral  SpO2: 94% 95%  98%  Weight:      Height:        Intake/Output Summary (Last 24 hours) at 10/24/2019 1506 Last data filed at 10/24/2019 1414 Gross per 24 hour  Intake 226.05 ml  Output --  Net 226.05 ml   Filed Weights   10/24/19 1103  Weight: 110.9 kg    Telemetry    NSR - Personally Reviewed  ECG    NSR with inferior ST depression - Personally Reviewed  Physical Exam   GEN: No acute distress.   Neck: No JVD Cardiac: RRR, no murmurs, rubs, or gallops.  Respiratory: Clear to auscultation bilaterally. GI: Soft, nontender, non-distended  MS: No edema; No deformity. Neuro:  Nonfocal  Psych: Normal affect   Labs    Chemistry Recent Labs  Lab 10/23/19 2049 10/24/19 0451  NA 138 140  K 4.4 3.8  CL 104 107  CO2 25 23  GLUCOSE 102* 134*  BUN 14 12  CREATININE 0.89 0.91  CALCIUM 9.4 9.4  GFRNONAA >60 >60  GFRAA >60 >60  ANIONGAP 9 10     Hematology Recent Labs  Lab 10/23/19 2049 10/24/19 0451  WBC 10.3 10.7*  RBC 5.13 5.14  HGB 15.4 15.6  HCT 46.0 45.1  MCV 89.7 87.7  MCH 30.0 30.4  MCHC 33.5 34.6  RDW 13.2 13.1  PLT 211 185    Cardiac EnzymesNo results for input(s): TROPONINI in the last 168  hours. No results for input(s): TROPIPOC in the last 168 hours.   BNP Recent Labs  Lab 10/23/19 2049  BNP 59.6     DDimer No results for input(s): DDIMER in the last 168 hours.   Radiology    DG Chest 2 View  Result Date: 10/23/2019 CLINICAL DATA:  Chest pain. EXAM: CHEST - 2 VIEW COMPARISON:  September 24, 2019 FINDINGS: The heart size and mediastinal contours are within normal limits. Both lungs are clear. The visualized skeletal structures are unremarkable. IMPRESSION: No active cardiopulmonary disease. Electronically Signed   By: Virgina Norfolk M.D.   On: 10/23/2019 21:15   CT ANGIO CHEST PE W OR WO CONTRAST  Result Date: 10/24/2019 CLINICAL DATA:  Shortness of breath and chest pain for 2 weeks he EXAM: CT ANGIOGRAPHY CHEST WITH CONTRAST TECHNIQUE: Multidetector CT imaging of the chest was performed using the standard protocol during bolus administration of intravenous contrast. Multiplanar CT image reconstructions and MIPs were obtained to evaluate the vascular anatomy. CONTRAST:  169mL OMNIPAQUE IOHEXOL 350 MG/ML SOLN COMPARISON:  September 25, 2019  FINDINGS: Cardiovascular: There is a optimal opacification of the pulmonary arteries. There is no central,segmental, or subsegmental filling defects within the pulmonary arteries. The heart is normal in size. No pericardial effusion or thickening. No evidence right heart strain. There is normal three-vessel brachiocephalic anatomy without proximal stenosis. The thoracic aorta is normal in appearance. Mild aortic arch calcifications are seen. Scattered coronary artery calcifications are seen. Mediastinum/Nodes: No hilar, mediastinal, or axillary adenopathy. Thyroid gland, trachea, and esophagus demonstrate no significant findings. Lungs/Pleura: There is minimal ground-glass atelectasis at both lung bases. No pleural effusion or pneumothorax. No airspace consolidation. Upper Abdomen: No acute abnormalities present in the visualized portions of the upper  abdomen. Musculoskeletal: No chest wall abnormality. No acute or significant osseous findings. Review of the MIP images confirms the above findings. IMPRESSION: No central, segmental, or subsegmental pulmonary embolism. No other acute intrathoracic pathology to explain the patient's symptoms. Aortic Atherosclerosis (ICD10-I70.0). Electronically Signed   By: Jonna Clark M.D.   On: 10/24/2019 01:23    Cardiac Studies   none  Patient Profile     59 y.o. male with known peripheral vascular disease, s/p left to right femoral bypass grafting admitted with USA/NSTEMI.   Assessment & Plan    1. USA/NSTEMI - he is pain free. He will undergo left heart cath on Monday. 2. Peripheral vascular disease - he pain free, at least at rest.  3. HTN - his bp is well controlled.   For questions or updates, please contact [facility] Please consult [email or web address] for contact info under Cardiology/STEMI.    Signed, Lewayne Bunting, MD  10/24/2019, 3:06 PM  Patient ID: Anthony Pontes., male   DOB: 09/05/1960, 59 y.o.   MRN: [id number]

## 2019-10-24 NOTE — Progress Notes (Signed)
ANTICOAGULATION CONSULT NOTE - Initial Consult  Pharmacy Consult for Heparin  Indication: chest pain/ACS  Allergies  Allergen Reactions  . Cymbalta [Duloxetine Hcl] Rash    Vital Signs: BP: 125/77 (04/24 0900) Pulse Rate: 87 (04/24 0900)  Labs: Recent Labs    10/23/19 2049 10/23/19 2301 10/24/19 0451 10/24/19 1037  HGB 15.4  --  15.6  --   HCT 46.0  --  45.1  --   PLT 211  --  185  --   HEPARINUNFRC  --   --   --  0.13*  CREATININE 0.89  --  0.91  --   TROPONINIHS 441* 542*  --   --     Estimated Creatinine Clearance: 108.6 mL/min (by C-G formula based on SCr of 0.91 mg/dL).   Medical History: Past Medical History:  Diagnosis Date  . Back pain   . Hyperlipidemia   . PAD (peripheral artery disease) (HCC)   . Stenosis of iliac artery [facility])    Assessment: 59 y/o M with chest pain, hypertension, elevated troponin. To start heparin. CT negative for PE. Fem-fem bypass about one month ago. CBC good. Renal function good. PTA meds reviewed.   Goal of Therapy:  Heparin level 0.3-0.7 units/ml Monitor platelets by anticoagulation protocol: Yes   Plan:  - Heparin level subtherapeutic at 0.13 - Rebolus with heparin 2800 units IV x 1 dose  - increase heparin drip to 1350 units/hr  - Check HL in ~ 6 hours  - Monitor patient for s/s of bleeding and cbc   Joaquim Lai, PharmD, BCPS Clinical Pharmacist Phone: [phone]

## 2019-10-24 NOTE — H&P (Signed)
Cardiology Admission History and Physical:   Patient ID: Anthony Todd. MRN: [id number]; DOB: 05-08-1961   Admission date: 10/23/2019  Primary Care Provider: Benita Stabile, MD Primary Cardiologist: Dr. Ladona Ridgel Primary Electrophysiologist:  None   Chief Complaint:  Chest Pain  Patient Profile:   Anthony Mode. is a 59 y.o. male with PMH significant for PAD s/p right to left femoral bypass, hyperlipidemia, hypertension and ongoing tobacco use who presents with chest pain.  History of Present Illness:   Anthony Todd was in his usual state of health until he underwent peripheral bypass surgery in March. He recovered slowly from the surgery but started noticing significant fluctuations in his blood pressure after surgery. He previously had no difficulties with blood pressure and typically runs ~130s/80s. He started to develop intermittent episodes of chest pressure with blood pressures ~200/100. He has been following closely with his PCP due to this and has been started on amlodipine 5mg , olmesartan 20mg , nitro PRN and most recently imdur 30mg . He has also been given protonix due to concerns that this could be related to reflux but he has not been taking this as he does not think it makes a difference. Lastly, he has also been using clonidine 0.2mg  PRN for these episodes of high blood pressure and chest pain. He states that these episodes occur at random. They occasionally occur with exertion but not all the time. He feels the chest pain first and then takes his blood pressure and finds it to be elevated. He denies any chest pressure or shortness of breath when his blood pressure is controlled. He denies any palpitations, lightheadedness, dizziness, lower leg swelling, orthopnea or PND. He describes the chest pressure as right-sided and central and radiating down both of his arms. He has been compliant with all of his medications. He typically takes a SL nitro when the chest pressure occurs  and notes that his blood pressure and chest pressure slowly subside. He had one severe episode of this where he called EMS a few days ago and decided to come in this evening for evaluation as his symptoms are not improving.    Past Medical History:  Diagnosis Date  . Back pain   . Hyperlipidemia   . PAD (peripheral artery disease) (HCC)   . Stenosis of iliac artery [facility])     Past Surgical History:  Procedure Laterality Date  . ABDOMINAL AORTOGRAM W/LOWER EXTREMITY N/A 06/05/2019   Procedure: ABDOMINAL AORTOGRAM W/LOWER EXTREMITY;  Surgeon: , MD;  Location: [facility] INVASIVE CV LAB;  Service: Cardiovascular;  Laterality: N/A;  . BACK SURGERY    . FEMORAL ARTERY - FEMORAL ARTERY BYPASS GRAFT  09/14/2019  . FEMORAL-FEMORAL BYPASS GRAFT Bilateral 09/14/2019   Procedure: LEFT TO RIGHT BYPASS GRAFT FEMORAL-FEMORAL ARTERY USING HEMASHIELD GOLD GRAFT;  Surgeon: Sherren Kerns, MD;  Location: [facility] OR;  Service: Vascular;  Laterality: Bilateral;  . PERIPHERAL VASCULAR BALLOON ANGIOPLASTY Left 06/05/2019   Procedure: PERIPHERAL VASCULAR BALLOON ANGIOPLASTY;  Surgeon: 09/16/2019, MD;  Location: [facility] INVASIVE CV LAB;  Service: Cardiovascular;  Laterality: Left;  Iliac  . TONSILLECTOMY       Medications Prior to Admission: Prior to Admission medications   Medication Sig Start Date End Date Taking? Authorizing Provider  amLODipine (NORVASC) 5 MG tablet Take 5 mg by mouth at bedtime. 10/21/19  Yes [provider]  aspirin EC 81 MG tablet Take 81 mg by mouth at bedtime.   Yes [provider]  cloNIDine (  CATAPRES) 0.2 MG tablet Take 0.2 mg by mouth 3 (three) times daily. For blood pressure of 170. 09/22/19  Yes [provider]  diazepam (VALIUM) 5 MG tablet Take 5 mg by mouth daily as needed for anxiety.  10/07/19  Yes [provider]  isosorbide mononitrate (IMDUR) 30 MG 24 hr tablet Take 30 mg by mouth daily. 10/20/19  Yes [provider]    nitroGLYCERIN (NITROSTAT) 0.4 MG SL tablet Place 1 tablet under the tongue every 5 (five) minutes as needed for chest pain. Up to 3 doses. 10/20/19  Yes [provider]  olmesartan (BENICAR) 20 MG tablet Take 20 mg by mouth daily. 10/21/19  Yes [provider]  omeprazole (PRILOSEC) 40 MG capsule Take 40 mg by mouth at bedtime. 10/20/19  Yes [provider]  oxyCODONE-acetaminophen (PERCOCET/ROXICET) 5-325 MG tablet Take 1 tablet by mouth every 6 (six) hours as needed. Patient taking differently: Take 1 tablet by mouth every 6 (six) hours as needed for moderate pain.  09/24/19  Yes Emilie Rutter, PA-C  simvastatin (ZOCOR) 20 MG tablet Take 20 mg by mouth at bedtime.   Yes [provider]  tamsulosin (FLOMAX) 0.4 MG CAPS capsule Take 0.4 mg by mouth at bedtime. 01/27/19  Yes [provider]     Allergies:    Allergies  Allergen Reactions  . Cymbalta [Duloxetine Hcl] Rash    Social History:   Lives at home with his wife. Has been disabled for about 8 years due to dystonia. Smoked 2ppd. Occasional alcohol use. Denies any drug use.   Family History:   The patient's family history includes Cancer in his father; Diabetes in his mother.   No significant family history of heart disease.  ROS:  Please see the history of present illness.  All other ROS reviewed and negative.     Physical Exam/Data:   Vitals:   10/24/19 0015 10/24/19 0030 10/24/19 0200 10/24/19 0215  BP: (!) 129/91 125/86    Pulse: 82 78 77 79  Resp: 15 14    Temp:      TempSrc:      SpO2: 97% 97% 96% 97%   No intake or output data in the 24 hours ending 10/24/19 0422 Last 3 Weights 10/15/2019 09/24/2019 09/24/2019  Weight (lbs) 244 lb 11.2 oz 235 lb 239 lb 1.6 oz  Weight (kg) 110.995 kg 106.595 kg 108.455 kg     There is no height or weight on file to calculate BMI.  General:  Well nourished, well developed, in no acute distress, obese HEENT: normal Neck: JVP elevated to  the mid-neck when lying at 45 degrees Cardiac:  normal S1, S2; RRR; no murmurs, rubs or gallops Lungs:  clear to auscultation bilaterally, no wheezing, rhonchi or rales  Abd: soft, nontender, no hepatomegaly  Ext: no edema Musculoskeletal:  No deformities, BUE and BLE strength normal and equal Skin: warm and dry  Neuro:  CNs 2-12 intact, no focal abnormalities noted Psych:  Normal affect    EKG:  The ECG that was done was personally reviewed and demonstrates normal sinus rhythm with ST depression and T-wave inversions inferiorolaterally  Relevant CV Studies: Nuclear stress test 07/17/19   The left ventricular ejection fraction is normal (55-65%).  Nuclear stress EF: 62%.  No T wave inversion was noted during stress.  There was no ST segment deviation noted during stress.  This is a low risk study.   Normal perfusion. LVEF 62% with normal wall motion. This is  a low risk study.  Laboratory Data:  High Sensitivity Troponin:   Recent Labs  Lab 09/24/19 2230 09/25/19 0026 10/23/19 2049 10/23/19 2301  TROPONINIHS 6 9 441* 542*      Chemistry Recent Labs  Lab 10/23/19 2049  NA 138  K 4.4  CL 104  CO2 25  GLUCOSE 102*  BUN 14  CREATININE 0.89  CALCIUM 9.4  GFRNONAA >60  GFRAA >60  ANIONGAP 9    No results for input(s): PROT, ALBUMIN, AST, ALT, ALKPHOS, BILITOT in the last 168 hours. Hematology Recent Labs  Lab 10/23/19 2049  WBC 10.3  RBC 5.13  HGB 15.4  HCT 46.0  MCV 89.7  MCH 30.0  MCHC 33.5  RDW 13.2  PLT 211   BNP Recent Labs  Lab 10/23/19 2049  BNP 59.6    DDimer No results for input(s): DDIMER in the last 168 hours.   Radiology/Studies:  DG Chest 2 View  Result Date: 10/23/2019 CLINICAL DATA:  Chest pain. EXAM: CHEST - 2 VIEW COMPARISON:  September 24, 2019 FINDINGS: The heart size and mediastinal contours are within normal limits. Both lungs are clear. The visualized skeletal structures are unremarkable. IMPRESSION: No active  cardiopulmonary disease. Electronically Signed   By: Aram Candela M.D.   On: 10/23/2019 21:15   CT ANGIO CHEST PE W OR WO CONTRAST  Result Date: 10/24/2019 CLINICAL DATA:  Shortness of breath and chest pain for 2 weeks he EXAM: CT ANGIOGRAPHY CHEST WITH CONTRAST TECHNIQUE: Multidetector CT imaging of the chest was performed using the standard protocol during bolus administration of intravenous contrast. Multiplanar CT image reconstructions and MIPs were obtained to evaluate the vascular anatomy. CONTRAST:  OMNIPAQUE IOHEXOL 350 MG/ML SOLN COMPARISON:  September 25, 2019 FINDINGS: Cardiovascular: There is a optimal opacification of the pulmonary arteries. There is no central,segmental, or subsegmental filling defects within the pulmonary arteries. The heart is normal in size. No pericardial effusion or thickening. No evidence right heart strain. There is normal three-vessel brachiocephalic anatomy without proximal stenosis. The thoracic aorta is normal in appearance. Mild aortic arch calcifications are seen. Scattered coronary artery calcifications are seen. Mediastinum/Nodes: No hilar, mediastinal, or axillary adenopathy. Thyroid gland, trachea, and esophagus demonstrate no significant findings. Lungs/Pleura: There is minimal ground-glass atelectasis at both lung bases. No pleural effusion or pneumothorax. No airspace consolidation. Upper Abdomen: No acute abnormalities present in the visualized portions of the upper abdomen. Musculoskeletal: No chest wall abnormality. No acute or significant osseous findings. Review of the MIP images confirms the above findings. IMPRESSION: No central, segmental, or subsegmental pulmonary embolism. No other acute intrathoracic pathology to explain the patient's symptoms. Aortic Atherosclerosis (ICD10-I70.0). Electronically Signed   By: Jonna Clark M.D.   On: 10/24/2019 01:23   TIMI Risk Score for Unstable Angina or Non-ST Elevation MI:   The patient's TIMI risk score  is 5, which indicates a 26% risk of all cause mortality, new or recurrent myocardial infarction or need for urgent revascularization in the next 14 days.   Assessment and Plan:   Anthony Todd. is a 59 y.o. male with PMH significant for PAD s/p right to left femoral bypass, hyperlipidemia, hypertension and ongoing tobacco use who presents with chest pain.  1. NSTEMI- He presents with recurrent episodes of chest pain and hypertension. It is unclear which is the primary driver of his symptoms but he does have significant troponin elevation along with concerning symptoms and multiple risk factors for CAD (PAD, HTN, HLD, tobacco use,  obesity, and atherosclerotic disease on CTs). While he had a low-risk stress echo in January, his symptoms are concerning and warrant further work-up. - S/p aspirin 324mg  - Continue home aspirin 81mg  - Heparin drip, ACS nomogram - Switch simvastatin to rosuvastatin 20mg  for better cholesterol control - Monitor on tele - Start carvedilol 6.25mg  BID - Tentatively plan for LHC on 4/26 - ECG for chest pain  2. Hypertension- Well-controlled in the ER but with significant fluctuations in pressure at home. - Continue amlodipine 5mg  - Irbesartan 150mg  in place of home olmesartan 20mg   - Start carvedilol 6.25mg  BID as above - Continue home imdur  3. Hyperlipidemia- Warrants high intensity statin given known PAD. Lipid panel in March with LDL >70 - Switch simvastatin to rosuvastatin 20mg  as above   Severity of Illness: The appropriate patient status for this patient is INPATIENT. Inpatient status is judged to be reasonable and necessary in order to provide the required intensity of service to ensure the patient's safety. The patient's presenting symptoms, physical exam findings, and initial radiographic and laboratory data in the context of their chronic comorbidities is felt to place them at high risk for further clinical deterioration. Furthermore, it is not  anticipated that the patient will be medically stable for discharge from the hospital within 2 midnights of admission. The following factors support the patient status of inpatient.   " The patient's presenting symptoms include chest pain. " The worrisome physical exam findings include hypertension, chest pain. " The initial radiographic and laboratory data are worrisome because of elevated troponin. " The chronic co-morbidities include PAD, HTN, HLD, tobacco use.   * I certify that at the point of admission it is my clinical judgment that the patient will require inpatient hospital care spanning beyond 2 midnights from the point of admission due to high intensity of service, high risk for further deterioration and high frequency of surveillance required.*    For questions or updates, please contact [place] Please consult [email or web address] for contact info under        Signed, Princella Pellegrini, MD  10/24/2019 4:22 AM

## 2019-10-24 NOTE — Progress Notes (Signed)
ANTICOAGULATION CONSULT NOTE - Initial Consult  Pharmacy Consult for Heparin  Indication: chest pain/ACS  Allergies  Allergen Reactions  . Cymbalta [Duloxetine Hcl] Rash    Vital Signs: Temp: 97.6 F (36.4 C) (04/23 2221) Temp Source: Oral (04/23 2221) BP: 125/86 (04/24 0030) Pulse Rate: 79 (04/24 0215)  Labs: Recent Labs    10/23/19 2049 10/23/19 2301  HGB 15.4  --   HCT 46.0  --   PLT 211  --   CREATININE 0.89  --   TROPONINIHS 441* 542*    Estimated Creatinine Clearance: 111.1 mL/min (by C-G formula based on SCr of 0.89 mg/dL).   Medical History: Past Medical History:  Diagnosis Date  . Back pain   . Hyperlipidemia   . PAD (peripheral artery disease) (HCC)   . Stenosis of iliac artery [facility])    Assessment: 59 y/o M with chest pain, hypertension, elevated troponin. To start heparin. CT negative for PE. Fem-fem bypass about one month ago. CBC good. Renal function good. PTA meds reviewed.   Goal of Therapy:  Heparin level 0.3-0.7 units/ml Monitor platelets by anticoagulation protocol: Yes   Plan:  Heparin 4000 units BOLUS Start heparin drip at 1200 units/hr 0900 HL Daily CBC/HL Monitor for bleeding  Abran Duke, PharmD, BCPS Clinical Pharmacist Phone: [phone]

## 2019-10-24 NOTE — Progress Notes (Signed)
ANTICOAGULATION CONSULT NOTE -Follow Up Consult  Pharmacy Consult for Heparin  Indication: chest pain/ACS  Allergies  Allergen Reactions  . Cymbalta [Duloxetine Hcl] Rash    Vital Signs: Temp: 98.2 F (36.8 C) (04/24 1958) Temp Source: Oral (04/24 1958) BP: 131/77 (04/24 1958) Pulse Rate: 89 (04/24 1958)  Labs: Recent Labs    10/23/19 2049 10/23/19 2301 10/24/19 0451 10/24/19 1037 10/24/19 1635  HGB 15.4  --  15.6  --   --   HCT 46.0  --  45.1  --   --   PLT 211  --  185  --   --   HEPARINUNFRC  --   --   --  0.13* 0.29*  CREATININE 0.89  --  0.91  --   --   TROPONINIHS 441* 542*  --   --   --     Estimated Creatinine Clearance: 108.6 mL/min (by C-G formula based on SCr of 0.91 mg/dL).   Medical History: Past Medical History:  Diagnosis Date  . Back pain   . Hyperlipidemia   . PAD (peripheral artery disease) (HCC)   . Stenosis of iliac artery [facility])    Assessment: 59 y/o M with chest pain, hypertension, elevated troponin. To start heparin. CT negative for PE. Fem-fem bypass about one month ago. CBC good. Renal function good. PTA meds reviewed.  Heparin drip 1350 uts/hr HL 0.29 just below goal - will increase slightly   Goal of Therapy:  Heparin level 0.3-0.7 units/ml Monitor platelets by anticoagulation protocol: Yes   Plan:  Increase Heparin 1400 uts/hr  Daily CBC/HL Monitor for bleeding  Leota Sauers Pharm.D. CPP, BCPS Clinical Pharmacist [phone] 10/24/2019 8:19 PM

## 2019-10-24 NOTE — ED Notes (Signed)
Tele   Breakfast ordered  

## 2019-10-24 NOTE — Progress Notes (Signed)
  Echocardiogram 2D Echocardiogram has been performed.  Celene Skeen 10/24/2019, 4:11 PM

## 2019-10-24 NOTE — Progress Notes (Signed)
Patient smokes 2 packs per day and is requesting nicotine patch.  Cardiology paged.

## 2019-10-25 LAB — CBC
HCT: 43.9 % (ref 39.0–52.0)
Hemoglobin: 14.9 g/dL (ref 13.0–17.0)
MCH: 30.1 pg (ref 26.0–34.0)
MCHC: 33.9 g/dL (ref 30.0–36.0)
MCV: 88.7 fL (ref 80.0–100.0)
Platelets: 196 10*3/uL (ref 150–400)
RBC: 4.95 MIL/uL (ref 4.22–5.81)
RDW: 13.2 % (ref 11.5–15.5)
WBC: 8.3 10*3/uL (ref 4.0–10.5)
nRBC: 0 % (ref 0.0–0.2)

## 2019-10-25 LAB — HEPARIN LEVEL (UNFRACTIONATED)
Heparin Unfractionated: 0.17 IU/mL — ABNORMAL LOW (ref 0.30–0.70)
Heparin Unfractionated: 0.29 IU/mL — ABNORMAL LOW (ref 0.30–0.70)

## 2019-10-25 MED ORDER — SODIUM CHLORIDE 0.9 % WEIGHT BASED INFUSION
3.0000 mL/kg/h | INTRAVENOUS | Status: DC
  Administered 2019-10-26: 3 mL/kg/h via INTRAVENOUS

## 2019-10-25 MED ORDER — ALUM & MAG HYDROXIDE-SIMETH 200-200-20 MG/5ML PO SUSP
30.0000 mL | ORAL | Status: DC | PRN
  Administered 2019-10-25 – 2019-10-27 (×2): 30 mL via ORAL
  Filled 2019-10-25 (×2): qty 30

## 2019-10-25 MED ORDER — SODIUM CHLORIDE 0.9% FLUSH: 3.0000 mL | INTRAVENOUS | Status: DC | PRN

## 2019-10-25 MED ORDER — SODIUM CHLORIDE 0.9 % IV SOLN: 250.0000 mL | INTRAVENOUS | Status: DC | PRN

## 2019-10-25 MED ORDER — CEFAZOLIN SODIUM-DEXTROSE 1-4 GM/50ML-% IV SOLN: 1.0000 g | INTRAVENOUS | Status: AC

## 2019-10-25 MED ORDER — SODIUM CHLORIDE 0.9% FLUSH
3.0000 mL | Freq: Two times a day (BID) | INTRAVENOUS | Status: DC
  Administered 2019-10-26: 11:00:00 3 mL via INTRAVENOUS

## 2019-10-25 MED ORDER — SODIUM CHLORIDE 0.9 % WEIGHT BASED INFUSION: 1.0000 mL/kg/h | INTRAVENOUS | Status: DC

## 2019-10-25 NOTE — Progress Notes (Addendum)
Progress Note  Patient Name: Anthony Todd. Date of Encounter: 10/25/2019  Primary Cardiologist: Lewayne Bunting, MD   Subjective   No acute overnight events. No chest pain, shortness of breath, or palpitations.  Inpatient Medications    Scheduled Meds: . amLODipine  5 mg Oral QHS  . aspirin EC  81 mg Oral QHS  . carvedilol  6.25 mg Oral BID WC  . irbesartan  150 mg Oral Daily  . isosorbide mononitrate  30 mg Oral Daily  . nicotine  14 mg Transdermal Daily  . rosuvastatin  20 mg Oral Daily  . tamsulosin  0.4 mg Oral QHS   Continuous Infusions: . heparin 1,400 Units/hr (10/24/19 2041)   PRN Meds: acetaminophen, alum & mag hydroxide-simeth, nitroGLYCERIN, ondansetron (ZOFRAN) IV, oxyCODONE-acetaminophen   Vital Signs    Vitals:   10/24/19 1958 10/24/19 2127 10/25/19 0018 10/25/19 0508  BP: 131/77 118/72 119/86 131/68  Pulse: 89 82 78 88  Resp: 20  20 18   Temp: 98.2 F (36.8 C)  98.2 F (36.8 C) 98.4 F (36.9 C)  TempSrc: Oral  Oral Oral  SpO2: 99%  99% 99%  Weight:    110 kg  Height:        Intake/Output Summary (Last 24 hours) at 10/25/2019 0804 Last data filed at 10/24/2019 2130 Gross per 24 hour  Intake 466.05 ml  Output --  Net 466.05 ml   Last 3 Weights 10/25/2019 10/24/2019 10/15/2019  Weight (lbs) 242 lb 8.1 oz 244 lb 7.8 oz 244 lb 11.2 oz  Weight (kg) 110 kg 110.9 kg 110.995 kg      Telemetry    Normal sinus rhythm with rates in the 80's to 90's. - Personally Reviewed  ECG    No new ECG tracing today. - Personally Reviewed  Physical Exam   GEN: Morbidly obese Caucasian male resting comfortably in no acute distress.   Neck: No JVD. Cardiac: RRR. No murmurs, rubs, or gallops.  Respiratory: Clear to auscultation bilaterally. No wheezes, rhonchi, or ales. GI: Soft, nontender, non-distended. Bowel sounds present. MS: No lower extremity edema. No deformity. Skin: Warm and dry. Neuro:  No focal deficits. Small open wound in left groin from  vascular procedure last month. No active drainage but dried blood on dressing. Mildly erythematous with small area of induration. Not warm to the touch. Psych: Normal affect. Responds appropriately.  Labs    High Sensitivity Troponin:   Recent Labs  Lab 10/23/19 2049 10/23/19 2301  TROPONINIHS 441* 542*      Chemistry Recent Labs  Lab 10/23/19 2049 10/24/19 0451  NA 138 140  K 4.4 3.8  CL 104 107  CO2 25 23  GLUCOSE 102* 134*  BUN 14 12  CREATININE 0.89 0.91  CALCIUM 9.4 9.4  GFRNONAA >60 >60  GFRAA >60 >60  ANIONGAP 9 10     Hematology Recent Labs  Lab 10/23/19 2049 10/24/19 0451 10/25/19 0446  WBC 10.3 10.7* 8.3  RBC 5.13 5.14 4.95  HGB 15.4 15.6 14.9  HCT 46.0 45.1 43.9  MCV 89.7 87.7 88.7  MCH 30.0 30.4 30.1  MCHC 33.5 34.6 33.9  RDW 13.2 13.1 13.2  PLT 211 185 196    BNP Recent Labs  Lab 10/23/19 2049  BNP 59.6     DDimer No results for input(s): DDIMER in the last 168 hours.   Radiology    DG Chest 2 View  Result Date: 10/23/2019 CLINICAL DATA:  Chest pain. EXAM: CHEST - 2  VIEW COMPARISON:  September 24, 2019 FINDINGS: The heart size and mediastinal contours are within normal limits. Both lungs are clear. The visualized skeletal structures are unremarkable. IMPRESSION: No active cardiopulmonary disease. Electronically Signed   By: Aram Candelahaddeus  Houston M.D.   On: 10/23/2019 21:15   CT ANGIO CHEST PE W OR WO CONTRAST  Result Date: 10/24/2019 CLINICAL DATA:  Shortness of breath and chest pain for 2 weeks he EXAM: CT ANGIOGRAPHY CHEST WITH CONTRAST TECHNIQUE: Multidetector CT imaging of the chest was performed using the standard protocol during bolus administration of intravenous contrast. Multiplanar CT image reconstructions and MIPs were obtained to evaluate the vascular anatomy. CONTRAST:  100mL OMNIPAQUE IOHEXOL 350 MG/ML SOLN COMPARISON:  September 25, 2019 FINDINGS: Cardiovascular: There is a optimal opacification of the pulmonary arteries. There is no  central,segmental, or subsegmental filling defects within the pulmonary arteries. The heart is normal in size. No pericardial effusion or thickening. No evidence right heart strain. There is normal three-vessel brachiocephalic anatomy without proximal stenosis. The thoracic aorta is normal in appearance. Mild aortic arch calcifications are seen. Scattered coronary artery calcifications are seen. Mediastinum/Nodes: No hilar, mediastinal, or axillary adenopathy. Thyroid gland, trachea, and esophagus demonstrate no significant findings. Lungs/Pleura: There is minimal ground-glass atelectasis at both lung bases. No pleural effusion or pneumothorax. No airspace consolidation. Upper Abdomen: No acute abnormalities present in the visualized portions of the upper abdomen. Musculoskeletal: No chest wall abnormality. No acute or significant osseous findings. Review of the MIP images confirms the above findings. IMPRESSION: No central, segmental, or subsegmental pulmonary embolism. No other acute intrathoracic pathology to explain the patient's symptoms. Aortic Atherosclerosis (ICD10-I70.0). Electronically Signed   By: Jonna ClarkBindu  Avutu M.D.   On: 10/24/2019 01:23   ECHOCARDIOGRAM COMPLETE  Result Date: 10/24/2019    ECHOCARDIOGRAM REPORT   [facility]   [facility] Date of Exam: 10/24/2019 Medical Rec #:  [id number]             Height:       69.0 in Accession #:    [id number]            Weight:       244.5 lb Date of Birth:  02-22-1961             BSA:          2.250 m Patient Age:    59 years              BP:           111/71 mmHg Patient Gender: M                     HR:           80 bpm. Exam Location:  Inpatient Procedure: 2D Echo Indications:    NSTEMI I21.4  History:        Patient has no prior history of Echocardiogram examinations.                 Risk Factors:Dyslipidemia and Current Smoker.  Sonographer:    Celene SkeenVijay Shankar RDCS (AE) Referring Phys: [phone] AMANDA C CONIGLIO IMPRESSIONS  1. Left ventricular  ejection fraction, by estimation, is 60 to 65%. The left ventricle has normal function. The left ventricle has no regional wall motion abnormalities. Left ventricular diastolic parameters were normal.  2. Right ventricular systolic function is normal. The right ventricular size is normal.  3. The mitral valve is normal in structure. No evidence of mitral  valve regurgitation. No evidence of mitral stenosis.  4. The aortic valve is normal in structure. Aortic valve regurgitation is not visualized. No aortic stenosis is present.  5. The inferior vena cava is normal in size with greater than 50% respiratory variability, suggesting right atrial pressure of 3 mmHg. FINDINGS  Left Ventricle: Left ventricular ejection fraction, by estimation, is 60 to 65%. The left ventricle has normal function. The left ventricle has no regional wall motion abnormalities. The left ventricular internal cavity size was normal in size. There is  no left ventricular hypertrophy. Left ventricular diastolic parameters were normal. Normal left ventricular filling pressure. Right Ventricle: The right ventricular size is normal. No increase in right ventricular wall thickness. Right ventricular systolic function is normal. Left Atrium: Left atrial size was normal in size. Right Atrium: Right atrial size was normal in size. Pericardium: There is no evidence of pericardial effusion. Mitral Valve: The mitral valve is normal in structure. Normal mobility of the mitral valve leaflets. No evidence of mitral valve regurgitation. No evidence of mitral valve stenosis. Tricuspid Valve: The tricuspid valve is normal in structure. Tricuspid valve regurgitation is not demonstrated. No evidence of tricuspid stenosis. Aortic Valve: The aortic valve is normal in structure. Aortic valve regurgitation is not visualized. No aortic stenosis is present. Pulmonic Valve: The pulmonic valve was normal in structure. Pulmonic valve regurgitation is not visualized. No  evidence of pulmonic stenosis. Aorta: The aortic root is normal in size and structure. Venous: The inferior vena cava is normal in size with greater than 50% respiratory variability, suggesting right atrial pressure of 3 mmHg. IAS/Shunts: No atrial level shunt detected by color flow Doppler.  LEFT VENTRICLE PLAX 2D LVIDd:         4.70 cm  Diastology LVIDs:         2.80 cm  LV e' lateral:   10.30 cm/s LV PW:         1.00 cm  LV E/e' lateral: 8.4 LV IVS:        1.00 cm  LV e' medial:    5.77 cm/s LVOT diam:     2.20 cm  LV E/e' medial:  14.9 LV SV:         75 LV SV Index:   33 LVOT Area:     3.80 cm  LEFT ATRIUM             Index       RIGHT ATRIUM           Index LA diam:        3.50 cm 1.56 cm/m  RA Area:     14.90 cm LA Vol (A2C):   74.1 ml 32.93 ml/m RA Volume:   31.20 ml  13.87 ml/m LA Vol (A4C):   52.2 ml 23.20 ml/m LA Biplane Vol: 62.7 ml 27.87 ml/m  AORTIC VALVE LVOT Vmax:   114.00 cm/s LVOT Vmean:  87.600 cm/s LVOT VTI:    0.198 m  AORTA Ao Root diam: 3.10 cm MITRAL VALVE MV Area (PHT): 3.34 cm    SHUNTS MV Decel Time: 227 msec    Systemic VTI:  0.20 m MV E velocity: 86.10 cm/s  Systemic Diam: 2.20 cm MV A velocity: 64.30 cm/s MV E/A ratio:  1.34 Montserrat Shek MD Electronically signed by Sanda Klein MD Signature Date/Time: 10/24/2019/6:40:35 PM    Final     Cardiac Studies   Echocardiogram 10/24/2019: Impressions: 1. Left ventricular ejection fraction, by estimation, is 60 to 65%.  The  left ventricle has normal function. The left ventricle has no regional  wall motion abnormalities. Left ventricular diastolic parameters were  normal.  2. Right ventricular systolic function is normal. The right ventricular  size is normal.  3. The mitral valve is normal in structure. No evidence of mitral valve  regurgitation. No evidence of mitral stenosis.  4. The aortic valve is normal in structure. Aortic valve regurgitation is  not visualized. No aortic stenosis is present.  5. The inferior  vena cava is normal in size with greater than 50%  respiratory variability, suggesting right atrial pressure of 3 mmHg.   Patient Profile     59 y.o. male with a history of PAD s/p left to right femoral bypass, hypertension, hyperlipidemia, and ongoing tobacco use who was admitted on 10/24/2019 with chest pain and found to have NSTEMI.  Assessment & Plan    NSTEMI - EKG shows T wave changes in inferior leads.  - High-sensitivity troponin elevated at 441 >> 542.  - Echo showed LVEF of 60-65% with normal wall motion and normal diastolic function.  - Currently chest pain free. - Continue IV Heparin. - Continue aspirin, beta-blocker, and high-intensity statin. Continue Imdur. - Plan is for left heart catheterization tomorrow. The patient understands that risks include but are not limited to stroke (1 in 1000), death (1 in 1000), kidney failure [usually temporary] (1 in 500), bleeding (1 in 200), allergic reaction [possibly serious] (1 in 200), and agrees to proceed.   PAD - S/p recent left to right femoral femoral bypass on 09/14/2019. - Continue aspirin and statin.   Hypertension - BP mostly well controlled. 152/89 this morning but this was before morning medications.  - Continue Amlodipine 5mg  daily, Coreg 6.25mg  twice daily, and Imdur 30mg  daily. Can continue to up-titrate as needed - would likely increase Coreg first. - Of note, patient concerned about BP spikes (as high as 240's/100's) at home. Renal arteries widely patent on abdominal aortogram in 06/2019.  Hyperlipidemia - Recent lipid panel from 09/16/2019: Total Cholesterol 154, Triglycerides 204, HDL 21, LDL 92.  - LDL goal <70 given CAD. - On Simvastatin at home but switched to Crestor 20mg  daily here. - Will need repeat lipid panel and LFTs in 8 weeks.   Left Groin Wound - Open left groin wound from vascular procedure on 09/14/2019. Draining last night but no drainage this morning. Slightly erythematous and one small area of  induration but not warm to the touch.  - May need Wound Consult. - Will discuss with MD.   Tobacco Abuse - Patient currently smokes almost 3 packs per day and has done this for at least 8 years.  - Currently on Nicotine patch. - Emphasized importance of complete cessation.   For questions or updates, please contact [facility] Please consult [email or web address] for contact info under        Signed, 09/18/2019, PA-C  10/25/2019, 8:04 AM    I have seen and examined the patient along with 09/16/2019, PA-C .  I have reviewed the chart, notes and new data.  I agree with PA/NP's note.  Key new complaints: I think that his presentation with episodes of chest discomfort associated with spikes in blood pressure actually represent unstable angina with reactive hypertension (rather than the other way around).  While he has been at rest and on intravenous heparin he has not had recurrent angina and his blood pressure has been normal.  When he try to wash  up at the sink this morning he again developed transient angina. Key examination changes: Obese.  Normal cardiovascular exam.  Has some serosanguineous drainage from the cranial end of his left femoral incision from the femoral-femoral bypass procedure performed roughly 1 month ago.  There is only very little surrounding erythema and induration.  He has not had fever or chills. Key new findings / data: Abnormal cardiac enzymes, but the electrocardiogram does not show any distinct territory of ischemia in the echocardiogram does not show any regional wall motion abnormalities.  PLAN: Cardiac catheterization and possible revascularization procedure scheduled for tomorrow. This procedure has been fully reviewed with the patient and written informed consent has been obtained. If this does not reveal the cause for his recent symptoms, suggest work-up for renal artery stenosis. We will ask the vascular surgery service to take a look at the left  groin wound, since there might be some evidence of infection, hopefully superficial.  Thurmon Fair, MD, [facility] [phone] 10/25/2019, 12:26 PM

## 2019-10-25 NOTE — Progress Notes (Signed)
ANTICOAGULATION CONSULT NOTE -Follow Up Consult  Pharmacy Consult for Heparin  Indication: chest pain/ACS  Allergies  Allergen Reactions  . Cymbalta [Duloxetine Hcl] Rash    Vital Signs: Temp: 98 F (36.7 C) (04/25 0735) Temp Source: Oral (04/25 0735) BP: 152/89 (04/25 0735) Pulse Rate: 88 (04/25 0853)  Labs: Recent Labs    10/23/19 2049 10/23/19 2049 10/23/19 2301 10/24/19 0451 10/24/19 1037 10/24/19 1635 10/25/19 0446  HGB 15.4   < >  --  15.6  --   --  14.9  HCT 46.0  --   --  45.1  --   --  43.9  PLT 211  --   --  185  --   --  196  HEPARINUNFRC  --   --   --   --  0.13* 0.29* 0.17*  CREATININE 0.89  --   --  0.91  --   --   --   TROPONINIHS 441*  --  542*  --   --   --   --    < > = values in this interval not displayed.    Estimated Creatinine Clearance: 108.1 mL/min (by C-G formula based on SCr of 0.91 mg/dL).   Medical History: Past Medical History:  Diagnosis Date  . Back pain   . Hyperlipidemia   . PAD (peripheral artery disease) (HCC)   . Stenosis of iliac artery [facility])    Assessment: 59 y/o M with chest pain, hypertension, elevated troponin. To start heparin. CT negative for PE. Fem-fem bypass about one month ago. CBC good. Renal function good. PTA meds reviewed.   Heparin level came back subtherapeutic this AM at 0.17. Pt is going to cath in AM. We will increase rate and check level this PM.   Goal of Therapy:  Heparin level 0.3-0.7 units/ml Monitor platelets by anticoagulation protocol: Yes   Plan:  Increase Heparin 1600 uts/hr  Check 6 hr HL Daily CBC/HL Monitor for bleeding  Ulyses Southward, PharmD, BCIDP, AAHIVP, CPP Infectious Disease Pharmacist 10/25/2019 12:03 PM

## 2019-10-25 NOTE — Progress Notes (Signed)
At 907 patient called RN on phone stating he was having CP(8/10).  Patient bathing at this time.  RN assisted patient with putting telemetry back on and new gown, patient assisted back to bed.  BP= 156/84 at 0913.  Marjie Skiff, PA notified.  EKG obtained and hand delivered to [facility], [place].  1 L O2 [place] applied.  Patient stated that his CP was now gone.  Patient instructed to only get up for bathroom needs for today.

## 2019-10-25 NOTE — Progress Notes (Signed)
ANTICOAGULATION CONSULT NOTE -Follow Up Consult  Pharmacy Consult for Heparin  Indication: chest pain/ACS  Allergies  Allergen Reactions  . Cymbalta [Duloxetine Hcl] Rash    Vital Signs: Temp: 97.9 F (36.6 C) (04/25 1601) Temp Source: Oral (04/25 1601) BP: 116/76 (04/25 1729) Pulse Rate: 78 (04/25 1729)  Labs: Recent Labs    10/23/19 2049 10/23/19 2049 10/23/19 2301 10/24/19 0451 10/24/19 1037 10/24/19 1635 10/25/19 0446 10/25/19 1806  HGB 15.4   < >  --  15.6  --   --  14.9  --   HCT 46.0  --   --  45.1  --   --  43.9  --   PLT 211  --   --  185  --   --  196  --   HEPARINUNFRC  --   --   --   --    < > 0.29* 0.17* 0.29*  CREATININE 0.89  --   --  0.91  --   --   --   --   TROPONINIHS 441*  --  542*  --   --   --   --   --    < > = values in this interval not displayed.    Estimated Creatinine Clearance: 108.1 mL/min (by C-G formula based on SCr of 0.91 mg/dL).   Medical History: Past Medical History:  Diagnosis Date  . Back pain   . Hyperlipidemia   . PAD (peripheral artery disease) (HCC)   . Stenosis of iliac artery [facility])    Assessment: 59 y/o M with chest pain, hypertension, elevated troponin. To start heparin. CT negative for PE. Fem-fem bypass about one month ago. CBC good. Renal function good. PTA meds reviewed.   Heparin level 0-.29 just under the goal on heparin drip rate 1600 its/hr   Pt is going to cath in AM.   Goal of Therapy:  Heparin level 0.3-0.7 units/ml Monitor platelets by anticoagulation protocol: Yes   Plan:  Increase Heparin 1650 uts/hr L Daily CBC/HL Monitor for bleeding  Leota Sauers Pharm.D. CPP, BCPS Clinical Pharmacist [phone] 10/25/2019 7:10 PM

## 2019-10-25 NOTE — Consult Note (Signed)
    Subjective  -   This is a 59 year old gentleman who is status post right left femoral-femoral bypass graft by Dr. Darrick Penna on 09/14/2019.  In the postoperative period he developed some scrotal swelling as well as a slight dehiscence from his left groin incision with erythema.  He was placed on antibiotics.  He was last seen in the office on 10/15/2019.  He was experiencing chest fluttering with radiation.  He presented with a NSTEMI and is scheduled for cardiac cath tomorrow.  He was placed on IV heparin.  He noted bloody drainage from his left groin and so we are asked to evaluate him.  He states that the dressing that was placed has not had any further drainage.   Physical Exam:  There is a slight dehiscence of the left groin incision however the majority of it is intact.  There is no active drainage at this time.  There is a minimal amount of erythema around the incision       Assessment/Plan:    Status post right to left femoral-femoral bypass graft which was done for claudication.  He has had some issues with wound healing in the left groin.  There is a slight amount of erythema and so I will start him on IV antibiotics while he is in the hospital.  There does not appear to be any evidence of graft infection.  He can proceed with cardiac cath via a radial approach tomorrow.  Dr. Darrick Penna will see in the morning.  Anthony Todd 10/25/2019 9:37 PM --  Vitals:   10/25/19 1729 10/25/19 2011  BP: 116/76 (!) 146/78  Pulse: 78 88  Resp:  20  Temp:  98.5 F (36.9 C)  SpO2:  98%   No intake or output data in the 24 hours ending 10/25/19 2137   Laboratory CBC    Component Value Date/Time   WBC 8.3 10/25/2019 0446   HGB 14.9 10/25/2019 0446   HCT 43.9 10/25/2019 0446   PLT 196 10/25/2019 0446    BMET    Component Value Date/Time   NA 140 10/24/2019 0451   K 3.8 10/24/2019 0451   CL 107 10/24/2019 0451   CO2 23 10/24/2019 0451   GLUCOSE 134 (H) 10/24/2019 0451   BUN 12  10/24/2019 0451   CREATININE 0.91 10/24/2019 0451   CALCIUM 9.4 10/24/2019 0451   GFRNONAA >60 10/24/2019 0451   GFRAA >60 10/24/2019 0451    COAG Lab Results  Component Value Date   INR 0.9 09/09/2019   No results found for: PTT  Antibiotics Anti-infectives (From admission, onward)   None       V. Charlena Cross, M.D., [facility] [place] Office: [phone] Pager:  [phone]

## 2019-10-25 NOTE — Plan of Care (Signed)
  Problem: Clinical Measurements: Goal: Ability to maintain clinical measurements within normal limits will improve Outcome: Progressing   Problem: Pain Managment: Goal: General experience of comfort will improve Outcome: Progressing   Problem: Nutrition: Goal: Adequate nutrition will be maintained Outcome: Completed/Met   Problem: Elimination: Goal: Will not experience complications related to bowel motility Outcome: Completed/Met Goal: Will not experience complications related to urinary retention Outcome: Completed/Met

## 2019-10-26 ENCOUNTER — Other Ambulatory Visit (HOSPITAL_COMMUNITY): Payer: Self-pay | Admitting: Respiratory Therapy

## 2019-10-26 ENCOUNTER — Inpatient Hospital Stay (HOSPITAL_COMMUNITY)
Admission: EM | Disposition: A | Discharge status: Home / Self Care | Payer: Self-pay | Source: Home / Self Care | Attending: Internal Medicine

## 2019-10-26 ENCOUNTER — Encounter (HOSPITAL_COMMUNITY): Payer: Self-pay | Admitting: Internal Medicine

## 2019-10-26 DIAGNOSIS — I2510 Atherosclerotic heart disease of native coronary artery without angina pectoris: Secondary | ICD-10-CM

## 2019-10-26 DIAGNOSIS — Z720 Tobacco use: Secondary | ICD-10-CM

## 2019-10-26 DIAGNOSIS — I739 Peripheral vascular disease, unspecified: Secondary | ICD-10-CM

## 2019-10-26 DIAGNOSIS — I10 Essential (primary) hypertension: Secondary | ICD-10-CM

## 2019-10-26 DIAGNOSIS — R0602 Shortness of breath: Secondary | ICD-10-CM

## 2019-10-26 HISTORY — PX: LEFT HEART CATH AND CORONARY ANGIOGRAPHY: CATH118249

## 2019-10-26 HISTORY — PX: CORONARY STENT INTERVENTION: CATH118234

## 2019-10-26 LAB — BASIC METABOLIC PANEL
Anion gap: 9 (ref 5–15)
BUN: 15 mg/dL (ref 6–20)
CO2: 23 mmol/L (ref 22–32)
Calcium: 9.1 mg/dL (ref 8.9–10.3)
Chloride: 107 mmol/L (ref 98–111)
Creatinine, Ser: 0.94 mg/dL (ref 0.61–1.24)
GFR calc Af Amer: >60 mL/min (ref >60)
GFR calc non Af Amer: >60 mL/min (ref >60)
Glucose, Bld: 109 mg/dL — ABNORMAL HIGH (ref 70–99)
Potassium: 4.2 mmol/L (ref 3.5–5.1)
Sodium: 139 mmol/L (ref 135–145)

## 2019-10-26 LAB — CBC
HCT: 42.1 % (ref 39.0–52.0)
Hemoglobin: 14.3 g/dL (ref 13.0–17.0)
MCH: 30.2 pg (ref 26.0–34.0)
MCHC: 34 g/dL (ref 30.0–36.0)
MCV: 89 fL (ref 80.0–100.0)
Platelets: 200 10*3/uL (ref 150–400)
RBC: 4.73 MIL/uL (ref 4.22–5.81)
RDW: 13.1 % (ref 11.5–15.5)
WBC: 8.2 10*3/uL (ref 4.0–10.5)
nRBC: 0 % (ref 0.0–0.2)

## 2019-10-26 LAB — HEPARIN LEVEL (UNFRACTIONATED): Heparin Unfractionated: 0.3 IU/mL (ref 0.30–0.70)

## 2019-10-26 LAB — POCT ACTIVATED CLOTTING TIME
Activated Clotting Time: 296 seconds
Activated Clotting Time: 230 seconds

## 2019-10-26 SURGERY — LEFT HEART CATH AND CORONARY ANGIOGRAPHY
Anesthesia: LOCAL

## 2019-10-26 MED ORDER — MORPHINE SULFATE (PF) 2 MG/ML IV SOLN
2.0000 mg | Freq: Four times a day (QID) | INTRAVENOUS | Status: DC | PRN
  Administered 2019-10-26: 2 mg via INTRAVENOUS
  Filled 2019-10-26 (×2): qty 1

## 2019-10-26 MED ORDER — HEPARIN SODIUM (PORCINE) 1000 UNIT/ML IJ SOLN
INTRAMUSCULAR | Status: AC
  Filled 2019-10-26: qty 1

## 2019-10-26 MED ORDER — FENTANYL CITRATE (PF) 100 MCG/2ML IJ SOLN
INTRAMUSCULAR | Status: DC | PRN
  Administered 2019-10-26 (×2): 25 ug via INTRAVENOUS
  Administered 2019-10-26: 50 ug via INTRAVENOUS

## 2019-10-26 MED ORDER — HEPARIN SODIUM (PORCINE) 1000 UNIT/ML IJ SOLN
INTRAMUSCULAR | Status: DC | PRN
  Administered 2019-10-26: 5500 [IU] via INTRAVENOUS
  Administered 2019-10-26: 3000 [IU] via INTRAVENOUS
  Administered 2019-10-26: 5500 [IU] via INTRAVENOUS

## 2019-10-26 MED ORDER — HEPARIN (PORCINE) IN NACL 1000-0.9 UT/500ML-% IV SOLN
INTRAVENOUS | Status: AC
  Filled 2019-10-26: qty 1000

## 2019-10-26 MED ORDER — NITROGLYCERIN 1 MG/10 ML FOR IR/CATH LAB
INTRA_ARTERIAL | Status: DC | PRN
  Administered 2019-10-26 (×2): 200 ug

## 2019-10-26 MED ORDER — LABETALOL HCL 5 MG/ML IV SOLN: 10.0000 mg | INTRAVENOUS | Status: AC | PRN

## 2019-10-26 MED ORDER — SODIUM CHLORIDE 0.9% FLUSH
3.0000 mL | Freq: Two times a day (BID) | INTRAVENOUS | Status: DC
  Administered 2019-10-27 (×2): 3 mL via INTRAVENOUS

## 2019-10-26 MED ORDER — ENOXAPARIN SODIUM 40 MG/0.4ML ~~LOC~~ SOLN
40.0000 mg | SUBCUTANEOUS | Status: DC
  Administered 2019-10-27: 40 mg via SUBCUTANEOUS
  Filled 2019-10-26: qty 0.4

## 2019-10-26 MED ORDER — FENTANYL CITRATE (PF) 100 MCG/2ML IJ SOLN
INTRAMUSCULAR | Status: AC
  Filled 2019-10-26: qty 2

## 2019-10-26 MED ORDER — VERAPAMIL HCL 2.5 MG/ML IV SOLN
INTRAVENOUS | Status: AC
  Filled 2019-10-26: qty 2

## 2019-10-26 MED ORDER — TICAGRELOR 90 MG PO TABS
ORAL_TABLET | ORAL | Status: DC | PRN
  Administered 2019-10-26: 180 mg via ORAL

## 2019-10-26 MED ORDER — MIDAZOLAM HCL 2 MG/2ML IJ SOLN
INTRAMUSCULAR | Status: AC
  Filled 2019-10-26: qty 2

## 2019-10-26 MED ORDER — CEFAZOLIN SODIUM-DEXTROSE 1-4 GM/50ML-% IV SOLN
1.0000 g | Freq: Three times a day (TID) | INTRAVENOUS | Status: DC
  Administered 2019-10-26 – 2019-10-27 (×3): 1 g via INTRAVENOUS
  Filled 2019-10-26 (×4): qty 50

## 2019-10-26 MED ORDER — SODIUM CHLORIDE 0.9 % IV SOLN: 250.0000 mL | INTRAVENOUS | Status: DC | PRN

## 2019-10-26 MED ORDER — TICAGRELOR 90 MG PO TABS
ORAL_TABLET | ORAL | Status: AC
  Filled 2019-10-26: qty 2

## 2019-10-26 MED ORDER — LIDOCAINE HCL (PF) 1 % IJ SOLN
INTRAMUSCULAR | Status: AC
  Filled 2019-10-26: qty 30

## 2019-10-26 MED ORDER — HYDRALAZINE HCL 20 MG/ML IJ SOLN: 10.0000 mg | INTRAMUSCULAR | Status: AC | PRN

## 2019-10-26 MED ORDER — HEPARIN (PORCINE) IN NACL 1000-0.9 UT/500ML-% IV SOLN
INTRAVENOUS | Status: DC | PRN
  Administered 2019-10-26 (×2): 500 mL

## 2019-10-26 MED ORDER — HYDRALAZINE HCL 20 MG/ML IJ SOLN
INTRAMUSCULAR | Status: AC
  Filled 2019-10-26: qty 1

## 2019-10-26 MED ORDER — TICAGRELOR 90 MG PO TABS
90.0000 mg | ORAL_TABLET | Freq: Two times a day (BID) | ORAL | Status: DC
  Administered 2019-10-26 – 2019-10-27 (×2): 90 mg via ORAL
  Filled 2019-10-26 (×2): qty 1

## 2019-10-26 MED ORDER — HYDRALAZINE HCL 20 MG/ML IJ SOLN
INTRAMUSCULAR | Status: DC | PRN
  Administered 2019-10-26: 10 mg via INTRAVENOUS

## 2019-10-26 MED ORDER — SODIUM CHLORIDE 0.9% FLUSH: 3.0000 mL | INTRAVENOUS | Status: DC | PRN

## 2019-10-26 MED ORDER — VERAPAMIL HCL 2.5 MG/ML IV SOLN
INTRAVENOUS | Status: DC | PRN
  Administered 2019-10-26: 10 mL via INTRA_ARTERIAL

## 2019-10-26 MED ORDER — MIDAZOLAM HCL 2 MG/2ML IJ SOLN
INTRAMUSCULAR | Status: DC | PRN
  Administered 2019-10-26: 2 mg via INTRAVENOUS
  Administered 2019-10-26: 1 mg via INTRAVENOUS

## 2019-10-26 MED ORDER — SODIUM CHLORIDE 0.9 % WEIGHT BASED INFUSION
1.0000 mL/kg/h | INTRAVENOUS | Status: AC
  Administered 2019-10-26: 1 mL/kg/h via INTRAVENOUS

## 2019-10-26 MED ORDER — LIDOCAINE HCL (PF) 1 % IJ SOLN
INTRAMUSCULAR | Status: DC | PRN
  Administered 2019-10-26: 3 mL

## 2019-10-26 MED ORDER — NITROGLYCERIN 1 MG/10 ML FOR IR/CATH LAB
INTRA_ARTERIAL | Status: AC
  Filled 2019-10-26: qty 10

## 2019-10-26 MED ORDER — IOHEXOL 350 MG/ML SOLN
INTRAVENOUS | Status: DC | PRN
  Administered 2019-10-26: 105 mL

## 2019-10-26 SURGICAL SUPPLY — 21 items
BALLN  ~~LOC~~ SAPPHIRE 4.5X18 (BALLOONS) ×1
BALLN SAPPHIRE 2.5X15 (BALLOONS) ×2
BALLN ~~LOC~~ SAPPHIRE 4.5X18 (BALLOONS) ×1
BALLOON SAPPHIRE 2.5X15 (BALLOONS) IMPLANT
BALLOON ~~LOC~~ SAPPHIRE 4.5X18 (BALLOONS) IMPLANT
CATH 5FR JL3.5 JR4 ANG PIG MP (CATHETERS) ×1 IMPLANT
CATH VISTA GUIDE 6FR JR4 (CATHETERS) ×1 IMPLANT
DEVICE RAD COMP TR BAND LRG (VASCULAR PRODUCTS) ×1 IMPLANT
GLIDESHEATH SLEND SS 6F .021 (SHEATH) ×1 IMPLANT
GUIDEWIRE INQWIRE 1.5J.035X260 (WIRE) IMPLANT
INQWIRE 1.5J .035X260CM (WIRE) ×2
KIT ENCORE 26 ADVANTAGE (KITS) ×1 IMPLANT
KIT ESSENTIALS PG (KITS) IMPLANT
KIT HEART LEFT (KITS) ×2 IMPLANT
PACK CARDIAC CATHETERIZATION (CUSTOM PROCEDURE TRAY) ×2 IMPLANT
SHEATH PROBE COVER 6X72 (BAG) ×1 IMPLANT
STENT RESOLUTE ONYX 4.0X26 (Permanent Stent) ×1 IMPLANT
TRANSDUCER W/STOPCOCK (MISCELLANEOUS) ×2 IMPLANT
TUBING CIL FLEX 10 FLL-RA (TUBING) ×2 IMPLANT
WIRE ASAHI PROWATER 180CM (WIRE) ×1 IMPLANT
WIRE FIGHTER CROSSING 190CM (WIRE) ×1 IMPLANT

## 2019-10-26 NOTE — Progress Notes (Signed)
ANTICOAGULATION CONSULT NOTE -Follow Up Consult  Pharmacy Consult for Heparin  Indication: chest pain/ACS  Allergies  Allergen Reactions  . Cymbalta [Duloxetine Hcl] Rash    Vital Signs: Temp: 98.5 F (36.9 C) (04/26 0342) Temp Source: Oral (04/26 0342) BP: 152/89 (04/26 0342) Pulse Rate: 88 (04/26 0342)  Labs: Recent Labs    10/23/19 2049 10/23/19 2049 10/23/19 2301 10/24/19 0451 10/24/19 1037 10/25/19 0446 10/25/19 1806 10/26/19 0235  HGB 15.4   < >  --  15.6  --  14.9  --  14.3  HCT 46.0   < >  --  45.1  --  43.9  --  42.1  PLT 211   < >  --  185  --  196  --  200  HEPARINUNFRC  --   --   --   --    < > 0.17* 0.29* 0.30  CREATININE 0.89  --   --  0.91  --   --   --  0.94  TROPONINIHS 441*  --  542*  --   --   --   --   --    < > = values in this interval not displayed.    Estimated Creatinine Clearance: 104.6 mL/min (by C-G formula based on SCr of 0.94 mg/dL).   Medical History: Past Medical History:  Diagnosis Date  . Back pain   . Hyperlipidemia   . PAD (peripheral artery disease) (HCC)   . Stenosis of iliac artery [facility])    Assessment: 59 y/o M with chest pain, hypertension, elevated troponin. To start heparin. CT negative for PE. Fem-fem bypass about one month ago. Plans for cath today -heparin level= 0.3    Goal of Therapy:  Heparin level 0.3-0.7 units/ml Monitor platelets by anticoagulation protocol: Yes   Plan:  No heparin changes needed Daily CBC/HL  Harland German, PharmD Clinical Pharmacist **Pharmacist phone directory can now be found on [email or web address] ([email or web address]).  Listed under [facility].

## 2019-10-26 NOTE — Progress Notes (Signed)
Pt c/o sob, sats 99-100%, brilinta loaded this afternoon. Coke given. EKG without changes, no CP. Emelda Brothers RN

## 2019-10-26 NOTE — Progress Notes (Signed)
Anthony Todd arrived to rm from Cath lab having 5/10 CP, appearing very uncomfortable. Placed on 2L oxygen, 1SL ntg given. Cath lab tech reported Anthony Todd had been having 10/10 CP, last EKG in cath lab with pain normal. Anthony Abed NP paged to notifiy of pain. Cont to monitor. Emelda Brothers RN

## 2019-10-26 NOTE — H&P (View-Only) (Signed)
Progress Note  Patient Name: Anthony Todd. Date of Encounter: 10/26/2019  Primary Cardiologist: Lewayne Bunting, MD   Subjective   Feeling well. No chest pain, sob or palpitations.   Inpatient Medications    Scheduled Meds: . amLODipine  5 mg Oral QHS  . aspirin EC  81 mg Oral QHS  . carvedilol  6.25 mg Oral BID WC  . irbesartan  150 mg Oral Daily  . isosorbide mononitrate  30 mg Oral Daily  . nicotine  14 mg Transdermal Daily  . rosuvastatin  20 mg Oral Daily  . sodium chloride flush  3 mL Intravenous Q12H  . tamsulosin  0.4 mg Oral QHS   Continuous Infusions: . sodium chloride    . sodium chloride    .  ceFAZolin (ANCEF) IV    . heparin 1,650 Units/hr (10/25/19 2028)   PRN Meds: sodium chloride, acetaminophen, alum & mag hydroxide-simeth, nitroGLYCERIN, ondansetron (ZOFRAN) IV, oxyCODONE-acetaminophen, sodium chloride flush   Vital Signs    Vitals:   10/25/19 1729 10/25/19 2011 10/25/19 2012 10/26/19 0342  BP: 116/76 (!) 146/78  (!) 152/89  Pulse: 78 88  88  Resp:  20  20  Temp:  98.5 F (36.9 C)  98.5 F (36.9 C)  TempSrc:  Oral  Oral  SpO2:  98%  98%  Weight:   110 kg 109.6 kg  Height:       No intake or output data in the 24 hours ending 10/26/19 0757 Last 3 Weights 10/26/2019 10/25/2019 10/25/2019  Weight (lbs) 241 lb 9.6 oz 242 lb 8.1 oz 242 lb 8.1 oz  Weight (kg) 109.589 kg 110 kg 110 kg      Telemetry    NSR at rate of 70s - Personally Reviewed  ECG    No new tracing   Physical Exam   GEN: No acute distress.   Neck: No JVD Cardiac: RRR, no murmurs, rubs, or gallops. L groin without hematoma or ecchymosis.  Respiratory: Clear to auscultation bilaterally. GI: Soft, nontender, non-distended  MS: No edema; No deformity. Neuro:  Nonfocal  Psych: Normal affect   Labs    High Sensitivity Troponin:   Recent Labs  Lab 10/23/19 2049 10/23/19 2301  TROPONINIHS 441* 542*      Chemistry Recent Labs  Lab 10/23/19 2049  10/24/19 0451 10/26/19 0235  NA 138 140 139  K 4.4 3.8 4.2  CL 104 107 107  CO2 25 23 23   GLUCOSE 102* 134* 109*  BUN 14 12 15   CREATININE 0.89 0.91 0.94  CALCIUM 9.4 9.4 9.1  GFRNONAA >60 >60 >60  GFRAA >60 >60 >60  ANIONGAP 9 10 9      Hematology Recent Labs  Lab 10/24/19 0451 10/25/19 0446 10/26/19 0235  WBC 10.7* 8.3 8.2  RBC 5.14 4.95 4.73  HGB 15.6 14.9 14.3  HCT 45.1 43.9 42.1  MCV 87.7 88.7 89.0  MCH 30.4 30.1 30.2  MCHC 34.6 33.9 34.0  RDW 13.1 13.2 13.1  PLT 185 196 200    BNP Recent Labs  Lab 10/23/19 2049  BNP 59.6     Radiology    ECHOCARDIOGRAM COMPLETE  Result Date: 10/24/2019    ECHOCARDIOGRAM REPORT   Patient Name:   Anthony Todd. Date of Exam: 10/24/2019 Medical Rec #:  [place]             Height:       69.0 in Accession #:    [facility]  Weight:       244.5 lb Date of Birth:  10/03/1960             BSA:          2.250 m Patient Age:    59 years              BP:           111/71 mmHg Patient Gender: M                     HR:           80 bpm. Exam Location:  Inpatient Procedure: 2D Echo Indications:    NSTEMI I21.4  History:        Patient has no prior history of Echocardiogram examinations.                 Risk Factors:Dyslipidemia and Current Smoker.  Sonographer:    Vijay Shankar RDCS (AE) Referring Phys: [id number] AMANDA C CONIGLIO IMPRESSIONS  1. Left ventricular ejection fraction, by estimation, is 60 to 65%. The left ventricle has normal function. The left ventricle has no regional wall motion abnormalities. Left ventricular diastolic parameters were normal.  2. Right ventricular systolic function is normal. The right ventricular size is normal.  3. The mitral valve is normal in structure. No evidence of mitral valve regurgitation. No evidence of mitral stenosis.  4. The aortic valve is normal in structure. Aortic valve regurgitation is not visualized. No aortic stenosis is present.  5. The inferior vena cava is normal in size with  greater than 50% respiratory variability, suggesting right atrial pressure of 3 mmHg. FINDINGS  Left Ventricle: Left ventricular ejection fraction, by estimation, is 60 to 65%. The left ventricle has normal function. The left ventricle has no regional wall motion abnormalities. The left ventricular internal cavity size was normal in size. There is  no left ventricular hypertrophy. Left ventricular diastolic parameters were normal. Normal left ventricular filling pressure. Right Ventricle: The right ventricular size is normal. No increase in right ventricular wall thickness. Right ventricular systolic function is normal. Left Atrium: Left atrial size was normal in size. Right Atrium: Right atrial size was normal in size. Pericardium: There is no evidence of pericardial effusion. Mitral Valve: The mitral valve is normal in structure. Normal mobility of the mitral valve leaflets. No evidence of mitral valve regurgitation. No evidence of mitral valve stenosis. Tricuspid Valve: The tricuspid valve is normal in structure. Tricuspid valve regurgitation is not demonstrated. No evidence of tricuspid stenosis. Aortic Valve: The aortic valve is normal in structure. Aortic valve regurgitation is not visualized. No aortic stenosis is present. Pulmonic Valve: The pulmonic valve was normal in structure. Pulmonic valve regurgitation is not visualized. No evidence of pulmonic stenosis. Aorta: The aortic root is normal in size and structure. Venous: The inferior vena cava is normal in size with greater than 50% respiratory variability, suggesting right atrial pressure of 3 mmHg. IAS/Shunts: No atrial level shunt detected by color flow Doppler.  LEFT VENTRICLE PLAX 2D LVIDd:         4.70 cm  Diastology LVIDs:         2.80 cm  LV e' lateral:   10.30 cm/s LV PW:         1.00 cm  LV E/e' lateral: 8.4 LV IVS:        1.00 cm  LV e' medial:    5.77 cm/s LVOT diam:     2.20   cm  LV E/e' medial:  14.9 LV SV:         75 LV SV Index:   33 LVOT  Area:     3.80 cm  LEFT ATRIUM             Index       RIGHT ATRIUM           Index LA diam:        3.50 cm 1.56 cm/m  RA Area:     14.90 cm LA Vol (A2C):   74.1 ml 32.93 ml/m RA Volume:   31.20 ml  13.87 ml/m LA Vol (A4C):   52.2 ml 23.20 ml/m LA Biplane Vol: 62.7 ml 27.87 ml/m  AORTIC VALVE LVOT Vmax:   114.00 cm/s LVOT Vmean:  87.600 cm/s LVOT VTI:    0.198 m  AORTA Ao Root diam: 3.10 cm MITRAL VALVE MV Area (PHT): 3.34 cm    SHUNTS MV Decel Time: 227 msec    Systemic VTI:  0.20 m MV E velocity: 86.10 cm/s  Systemic Diam: 2.20 cm MV A velocity: 64.30 cm/s MV E/A ratio:  1.34 Mihai Croitoru MD Electronically signed by Thurmon Fair MD Signature Date/Time: 10/24/2019/6:40:35 PM    Final     Cardiac Studies    Echo 10/24/19 1. Left ventricular ejection fraction, by estimation, is 60 to 65%. The  left ventricle has normal function. The left ventricle has no regional  wall motion abnormalities. Left ventricular diastolic parameters were  normal.  2. Right ventricular systolic function is normal. The right ventricular  size is normal.  3. The mitral valve is normal in structure. No evidence of mitral valve  regurgitation. No evidence of mitral stenosis.  4. The aortic valve is normal in structure. Aortic valve regurgitation is  not visualized. No aortic stenosis is present.  5. The inferior vena cava is normal in size with greater than 50%  respiratory variability, suggesting right atrial pressure of 3 mmHg.   Patient Profile     59 y.o. male  with a history of PAD s/p left to right femoral bypass, hypertension, hyperlipidemia, and ongoing tobacco use who was admitted on 10/24/2019 with chest pain and found to have NSTEMI.  Assessment & Plan    1. NSTEMI - EKG with TWI in inferior leads. HS troponin 441>>542. Chest pain free. Echo with normal LVEF.  - For cath today via radial approach.  - Continue ASA, statin, BB and imdur  2. PAD - S/p recent left to right femoral femoral  bypass on 09/14/2019. - Seen by vascular yesterday for some bloody drainage from his left groin>> no evidence of infection but started on abx.   3. HLD - 09/16/2019: Cholesterol 154; HDL 21; LDL Cholesterol 92; Triglycerides 204; VLDL 41 - On Simvastatin at home but switched to Crestor 20mg  daily here. - Will need repeat lipid panel and LFTs in 8 weeks.    4. Tobacco abuse - recommended cessation   5. HTN - BP little up this morning. Will continue to monitor.    For questions or updates, please contact [facility] Please consult [email or web address] for contact info under        Signed , PA  10/26/2019, 7:57 AM    Patient seen and examined. Agree with assessment and plan. Last episode of chest tightness was yesterday. ECG without acute changes but with early transition. Currently on iv heparin. HS trop 542 c/w NSTEMI. L groin assessed today by Dr. 10/28/2019, 6  weeks s/p fem-fem. No active infection. Discussed  importance of smoking cessation and association of CAD and PVD. Now on rosuvastatin ; aim for LDL in the 50s. I have reviewed the risks, indications, and alternatives to cardiac catheterization, possible angioplasty, and stenting with the patient. Risks include but are not limited to bleeding, infection, vascular injury, stroke, myocardial infection, arrhythmia, kidney injury, radiation-related injury in the case of prolonged fluoroscopy use, emergency cardiac surgery, and death. The patient understands the risks of serious complication is 1-2 in 4163 with diagnostic cardiac cath and 1-2% or less with angioplasty/stenting.    Troy Sine, MD, [facility] 10/26/2019 10:16 AM

## 2019-10-26 NOTE — Interval H&P Note (Signed)
History and Physical Interval Note:  10/26/2019 2:53 PM  Anthony Todd.  has presented today for surgery, with the diagnosis of NSTMEI.  The various methods of treatment have been discussed with the patient and family. After consideration of risks, benefits and other options for treatment, the patient has consented to  Procedure(s): LEFT HEART CATH AND CORONARY ANGIOGRAPHY (N/A) as a surgical intervention.  The patient's history has been reviewed, patient examined, no change in status, stable for surgery.  I have reviewed the patient's chart and labs.  Questions were answered to the patient's satisfaction.   Cath Lab Visit (complete for each Cath Lab visit)  Clinical Evaluation Leading to the Procedure:   ACS: Yes.    Non-ACS:    Anginal Classification: CCS IV  Anti-ischemic medical therapy: Maximal Therapy (2 or more classes of medications)  Non-Invasive Test Results: No non-invasive testing performed  Prior CABG: No previous CABG        Theron Arista [facility] 10/26/2019 2:53 PM

## 2019-10-26 NOTE — Progress Notes (Addendum)
Progress Note  Patient Name: Anthony Todd. Date of Encounter: 10/26/2019  Primary Cardiologist: Lewayne Bunting, MD   Subjective   Feeling well. No chest pain, sob or palpitations.   Inpatient Medications    Scheduled Meds: . amLODipine  5 mg Oral QHS  . aspirin EC  81 mg Oral QHS  . carvedilol  6.25 mg Oral BID WC  . irbesartan  150 mg Oral Daily  . isosorbide mononitrate  30 mg Oral Daily  . nicotine  14 mg Transdermal Daily  . rosuvastatin  20 mg Oral Daily  . sodium chloride flush  3 mL Intravenous Q12H  . tamsulosin  0.4 mg Oral QHS   Continuous Infusions: . sodium chloride    . sodium chloride    .  ceFAZolin (ANCEF) IV    . heparin 1,650 Units/hr (10/25/19 2028)   PRN Meds: sodium chloride, acetaminophen, alum & mag hydroxide-simeth, nitroGLYCERIN, ondansetron (ZOFRAN) IV, oxyCODONE-acetaminophen, sodium chloride flush   Vital Signs    Vitals:   10/25/19 1729 10/25/19 2011 10/25/19 2012 10/26/19 0342  BP: 116/76 (!) 146/78  (!) 152/89  Pulse: 78 88  88  Resp:  20  20  Temp:  98.5 F (36.9 C)  98.5 F (36.9 C)  TempSrc:  Oral  Oral  SpO2:  98%  98%  Weight:   110 kg 109.6 kg  Height:       No intake or output data in the 24 hours ending 10/26/19 0757 Last 3 Weights 10/26/2019 10/25/2019 10/25/2019  Weight (lbs) 241 lb 9.6 oz 242 lb 8.1 oz 242 lb 8.1 oz  Weight (kg) 109.589 kg 110 kg 110 kg      Telemetry    NSR at rate of 70s - Personally Reviewed  ECG    No new tracing   Physical Exam   GEN: No acute distress.   Neck: No JVD Cardiac: RRR, no murmurs, rubs, or gallops. L groin without hematoma or ecchymosis.  Respiratory: Clear to auscultation bilaterally. GI: Soft, nontender, non-distended  MS: No edema; No deformity. Neuro:  Nonfocal  Psych: Normal affect   Labs    High Sensitivity Troponin:   Recent Labs  Lab 10/23/19 2049 10/23/19 2301  TROPONINIHS 441* 542*      Chemistry Recent Labs  Lab 10/23/19 2049  10/24/19 0451 10/26/19 0235  NA 138 140 139  K 4.4 3.8 4.2  CL 104 107 107  CO2 25 23 23   GLUCOSE 102* 134* 109*  BUN 14 12 15   CREATININE 0.89 0.91 0.94  CALCIUM 9.4 9.4 9.1  GFRNONAA >60 >60 >60  GFRAA >60 >60 >60  ANIONGAP 9 10 9      Hematology Recent Labs  Lab 10/24/19 0451 10/25/19 0446 10/26/19 0235  WBC 10.7* 8.3 8.2  RBC 5.14 4.95 4.73  HGB 15.6 14.9 14.3  HCT 45.1 43.9 42.1  MCV 87.7 88.7 89.0  MCH 30.4 30.1 30.2  MCHC 34.6 33.9 34.0  RDW 13.1 13.2 13.1  PLT 185 196 200    BNP Recent Labs  Lab 10/23/19 2049  BNP 59.6     Radiology    ECHOCARDIOGRAM COMPLETE  Result Date: 10/24/2019    ECHOCARDIOGRAM REPORT   Patient Name:   Anthony Todd [facility]. Date of Exam: 10/24/2019 Medical Rec #:  Anthony Todd             Height:       69.0 in Accession #:    [facility]  Weight:       244.5 lb Date of Birth:  August 16, 1960             BSA:          2.250 m Patient Age:    59 years              BP:           111/71 mmHg Patient Gender: M                     HR:           80 bpm. Exam Location:  Inpatient Procedure: 2D Echo Indications:    NSTEMI I21.4  History:        Patient has no prior history of Echocardiogram examinations.                 Risk Factors:Dyslipidemia and Current Smoker.  Sonographer:    Celene Skeen RDCS (AE) Referring Phys: [phone] AMANDA C CONIGLIO IMPRESSIONS  1. Left ventricular ejection fraction, by estimation, is 60 to 65%. The left ventricle has normal function. The left ventricle has no regional wall motion abnormalities. Left ventricular diastolic parameters were normal.  2. Right ventricular systolic function is normal. The right ventricular size is normal.  3. The mitral valve is normal in structure. No evidence of mitral valve regurgitation. No evidence of mitral stenosis.  4. The aortic valve is normal in structure. Aortic valve regurgitation is not visualized. No aortic stenosis is present.  5. The inferior vena cava is normal in size with  greater than 50% respiratory variability, suggesting right atrial pressure of 3 mmHg. FINDINGS  Left Ventricle: Left ventricular ejection fraction, by estimation, is 60 to 65%. The left ventricle has normal function. The left ventricle has no regional wall motion abnormalities. The left ventricular internal cavity size was normal in size. There is  no left ventricular hypertrophy. Left ventricular diastolic parameters were normal. Normal left ventricular filling pressure. Right Ventricle: The right ventricular size is normal. No increase in right ventricular wall thickness. Right ventricular systolic function is normal. Left Atrium: Left atrial size was normal in size. Right Atrium: Right atrial size was normal in size. Pericardium: There is no evidence of pericardial effusion. Mitral Valve: The mitral valve is normal in structure. Normal mobility of the mitral valve leaflets. No evidence of mitral valve regurgitation. No evidence of mitral valve stenosis. Tricuspid Valve: The tricuspid valve is normal in structure. Tricuspid valve regurgitation is not demonstrated. No evidence of tricuspid stenosis. Aortic Valve: The aortic valve is normal in structure. Aortic valve regurgitation is not visualized. No aortic stenosis is present. Pulmonic Valve: The pulmonic valve was normal in structure. Pulmonic valve regurgitation is not visualized. No evidence of pulmonic stenosis. Aorta: The aortic root is normal in size and structure. Venous: The inferior vena cava is normal in size with greater than 50% respiratory variability, suggesting right atrial pressure of 3 mmHg. IAS/Shunts: No atrial level shunt detected by color flow Doppler.  LEFT VENTRICLE PLAX 2D LVIDd:         4.70 cm  Diastology LVIDs:         2.80 cm  LV e' lateral:   10.30 cm/s LV PW:         1.00 cm  LV E/e' lateral: 8.4 LV IVS:        1.00 cm  LV e' medial:    5.77 cm/s LVOT diam:     2.20  cm  LV E/e' medial:  14.9 LV SV:         75 LV SV Index:   33 LVOT  Area:     3.80 cm  LEFT ATRIUM             Index       RIGHT ATRIUM           Index LA diam:        3.50 cm 1.56 cm/m  RA Area:     14.90 cm LA Vol (A2C):   74.1 ml 32.93 ml/m RA Volume:   31.20 ml  13.87 ml/m LA Vol (A4C):   52.2 ml 23.20 ml/m LA Biplane Vol: 62.7 ml 27.87 ml/m  AORTIC VALVE LVOT Vmax:   114.00 cm/s LVOT Vmean:  87.600 cm/s LVOT VTI:    0.198 m  AORTA Ao Root diam: 3.10 cm MITRAL VALVE MV Area (PHT): 3.34 cm    SHUNTS MV Decel Time: 227 msec    Systemic VTI:  0.20 m MV E velocity: 86.10 cm/s  Systemic Diam: 2.20 cm MV A velocity: 64.30 cm/s MV E/A ratio:  1.34 Mihai Croitoru MD Electronically signed by Thurmon Fair MD Signature Date/Time: 10/24/2019/6:40:35 PM    Final     Cardiac Studies    Echo 10/24/19 1. Left ventricular ejection fraction, by estimation, is 60 to 65%. The  left ventricle has normal function. The left ventricle has no regional  wall motion abnormalities. Left ventricular diastolic parameters were  normal.  2. Right ventricular systolic function is normal. The right ventricular  size is normal.  3. The mitral valve is normal in structure. No evidence of mitral valve  regurgitation. No evidence of mitral stenosis.  4. The aortic valve is normal in structure. Aortic valve regurgitation is  not visualized. No aortic stenosis is present.  5. The inferior vena cava is normal in size with greater than 50%  respiratory variability, suggesting right atrial pressure of 3 mmHg.   Patient Profile     59 y.o. male  with a history of PAD s/p left to right femoral bypass, hypertension, hyperlipidemia, and ongoing tobacco use who was admitted on 10/24/2019 with chest pain and found to have NSTEMI.  Assessment & Plan    1. NSTEMI - EKG with TWI in inferior leads. HS troponin 441>>542. Chest pain free. Echo with normal LVEF.  - For cath today via radial approach.  - Continue ASA, statin, BB and imdur  2. PAD - S/p recent left to right femoral femoral  bypass on 09/14/2019. - Seen by vascular yesterday for some bloody drainage from his left groin>> no evidence of infection but started on abx.   3. HLD - 09/16/2019: Cholesterol 154; HDL 21; LDL Cholesterol 92; Triglycerides 204; VLDL 41 - On Simvastatin at home but switched to Crestor 20mg  daily here. - Will need repeat lipid panel and LFTs in 8 weeks.    4. Tobacco abuse - recommended cessation   5. HTN - BP little up this morning. Will continue to monitor.    For questions or updates, please contact [facility] Please consult [email or web address] for contact info under        Signed , PA  10/26/2019, 7:57 AM    Patient seen and examined. Agree with assessment and plan. Last episode of chest tightness was yesterday. ECG without acute changes but with early transition. Currently on iv heparin. HS trop 542 c/w NSTEMI. L groin assessed today by Dr. 10/28/2019, 6  weeks s/p fem-fem. No active infection. Discussed  importance of smoking cessation and association of CAD and PVD. Now on rosuvastatin ; aim for LDL in the 50s. I have reviewed the risks, indications, and alternatives to cardiac catheterization, possible angioplasty, and stenting with the patient. Risks include but are not limited to bleeding, infection, vascular injury, stroke, myocardial infection, arrhythmia, kidney injury, radiation-related injury in the case of prolonged fluoroscopy use, emergency cardiac surgery, and death. The patient understands the risks of serious complication is 1-2 in 4163 with diagnostic cardiac cath and 1-2% or less with angioplasty/stenting.    Troy Sine, MD, [facility] 10/26/2019 10:16 AM

## 2019-10-26 NOTE — Progress Notes (Signed)
Pt admitted for cardiac work up.  He is about 6 weeks out from fem fem.  Exam:  Left groin with some skin maceration, no ongoing bleeding, no hematoma or erythema.    Recommend   Wash once daily with soap and water pat dry Cover with dry gauze dressing  He has follow up with [place] 11/05/19  Fabienne Bruns, MD [facility] Office: [phone]

## 2019-10-27 LAB — CBC
HCT: 42.2 % (ref 39.0–52.0)
Hemoglobin: 14.6 g/dL (ref 13.0–17.0)
MCH: 30.5 pg (ref 26.0–34.0)
MCHC: 34.6 g/dL (ref 30.0–36.0)
MCV: 88.1 fL (ref 80.0–100.0)
Platelets: 204 10*3/uL (ref 150–400)
RBC: 4.79 MIL/uL (ref 4.22–5.81)
RDW: 13.1 % (ref 11.5–15.5)
WBC: 9.3 10*3/uL (ref 4.0–10.5)
nRBC: 0 % (ref 0.0–0.2)

## 2019-10-27 LAB — BASIC METABOLIC PANEL
Anion gap: 9 (ref 5–15)
BUN: 11 mg/dL (ref 6–20)
CO2: 23 mmol/L (ref 22–32)
Calcium: 9 mg/dL (ref 8.9–10.3)
Chloride: 107 mmol/L (ref 98–111)
Creatinine, Ser: 0.95 mg/dL (ref 0.61–1.24)
GFR calc Af Amer: >60 mL/min (ref >60)
GFR calc non Af Amer: >60 mL/min (ref >60)
Glucose, Bld: 145 mg/dL — ABNORMAL HIGH (ref 70–99)
Potassium: 3.8 mmol/L (ref 3.5–5.1)
Sodium: 139 mmol/L (ref 135–145)

## 2019-10-27 MED ORDER — NICOTINE 14 MG/24HR TD PT24: 14.0000 mg | MEDICATED_PATCH | Freq: Every day | TRANSDERMAL | 0 refills | Status: DC

## 2019-10-27 MED ORDER — AMLODIPINE BESYLATE 10 MG PO TABS: 10.0000 mg | ORAL_TABLET | Freq: Every day | ORAL | 0 refills | Status: DC

## 2019-10-27 MED ORDER — AMLODIPINE BESYLATE 10 MG PO TABS: 10.0000 mg | ORAL_TABLET | Freq: Every day | ORAL | Status: DC

## 2019-10-27 MED ORDER — ROSUVASTATIN CALCIUM 20 MG PO TABS: 20.0000 mg | ORAL_TABLET | Freq: Every day | ORAL | 0 refills | Status: DC

## 2019-10-27 MED ORDER — CARVEDILOL 6.25 MG PO TABS: 6.2500 mg | ORAL_TABLET | Freq: Two times a day (BID) | ORAL | 0 refills | Status: DC

## 2019-10-27 MED ORDER — TICAGRELOR 90 MG PO TABS: 90.0000 mg | ORAL_TABLET | Freq: Two times a day (BID) | ORAL | 2 refills | Status: DC

## 2019-10-27 MED FILL — AMLODIPINE BESYLATE 10 MG T: 10 | 90 days supply | Qty: 90 | Fill #0

## 2019-10-27 MED FILL — ROSUVASTATIN CALCIUM 20 MG: 20 | 90 days supply | Qty: 90 | Fill #0

## 2019-10-27 MED FILL — BRILINTA 90 MG TABLET: 90 | 30 days supply | Qty: 60 | Fill #0

## 2019-10-27 MED FILL — NICOTINE 14 MG/24HR PATCH: 14 | 28 days supply | Qty: 28 | Fill #0

## 2019-10-27 MED FILL — CARVEDILOL 6.25 MG TABLET: 6.25 | 90 days supply | Qty: 180 | Fill #0

## 2019-10-27 MED FILL — Heparin Sodium (Porcine) Inj 1000 Unit/ML: INTRAMUSCULAR | Qty: 10 | Status: AC

## 2019-10-27 NOTE — Progress Notes (Addendum)
Progress Note  Patient Name: Anthony Todd. Date of Encounter: 10/27/2019  Primary Cardiologist: Lewayne Bunting, MD   Subjective   Denies any CP, had a single episode of SOB this morning, resolved.   Inpatient Medications    Scheduled Meds: . amLODipine  5 mg Oral QHS  . aspirin EC  81 mg Oral QHS  . carvedilol  6.25 mg Oral BID WC  . enoxaparin (LOVENOX) injection  40 mg Subcutaneous Q24H  . irbesartan  150 mg Oral Daily  . isosorbide mononitrate  30 mg Oral Daily  . nicotine  14 mg Transdermal Daily  . rosuvastatin  20 mg Oral Daily  . sodium chloride flush  3 mL Intravenous Q12H  . tamsulosin  0.4 mg Oral QHS  . ticagrelor  90 mg Oral BID   Continuous Infusions: . sodium chloride    .  ceFAZolin (ANCEF) IV 1 g (10/27/19 0355)   PRN Meds: sodium chloride, acetaminophen, alum & mag hydroxide-simeth, morphine injection, nitroGLYCERIN, ondansetron (ZOFRAN) IV, oxyCODONE-acetaminophen, sodium chloride flush   Vital Signs    Vitals:   10/26/19 1653 10/26/19 1722 10/26/19 2020 10/27/19 0355  BP: (!) 144/45 (!) 139/94 (!) 173/84 124/86  Pulse:  71 81 72  Resp:   (!) 22 20  Temp:   (!) 97.5 F (36.4 C) (!) 97.5 F (36.4 C)  TempSrc:   Oral Oral  SpO2:  98% 97% 97%  Weight:    112 kg  Height:        Intake/Output Summary (Last 24 hours) at 10/27/2019 0805 Last data filed at 10/27/2019 0600 Gross per 24 hour  Intake 1118.84 ml  Output 1685 ml  Net -566.16 ml   Last 3 Weights 10/27/2019 10/26/2019 10/25/2019  Weight (lbs) 246 lb 14.6 oz 241 lb 9.6 oz 242 lb 8.1 oz  Weight (kg) 112 kg 109.589 kg 110 kg      Telemetry    NSR without significant ventricular ectopy - Personally Reviewed  ECG    NSR without significant ST-T wave changes - Personally Reviewed  Physical Exam   GEN: No acute distress.   Neck: No JVD Cardiac: RRR, no murmurs, rubs, or gallops.  Respiratory: Clear to auscultation bilaterally. GI: Soft, nontender, non-distended  MS: No  edema; No deformity. L groin covered with dressing Neuro:  Nonfocal  Psych: Normal affect   Labs    High Sensitivity Troponin:   Recent Labs  Lab 10/23/19 2049 10/23/19 2301  TROPONINIHS 441* 542*      Chemistry Recent Labs  Lab 10/24/19 0451 10/26/19 0235 10/27/19 0324  NA 140 139 139  K 3.8 4.2 3.8  CL 107 107 107  CO2 23 23 23   GLUCOSE 134* 109* 145*  BUN 12 15 11   CREATININE 0.91 0.94 0.95  CALCIUM 9.4 9.1 9.0  GFRNONAA >60 >60 >60  GFRAA >60 >60 >60  ANIONGAP 10 9 9      Hematology Recent Labs  Lab 10/25/19 0446 10/26/19 0235 10/27/19 0324  WBC 8.3 8.2 9.3  RBC 4.95 4.73 4.79  HGB 14.9 14.3 14.6  HCT 43.9 42.1 42.2  MCV 88.7 89.0 88.1  MCH 30.1 30.2 30.5  MCHC 33.9 34.0 34.6  RDW 13.2 13.1 13.1  PLT 196 200 204    BNP Recent Labs  Lab 10/23/19 2049  BNP 59.6     DDimer No results for input(s): DDIMER in the last 168 hours.   Radiology    CARDIAC CATHETERIZATION  Result Date: 10/26/2019  Prox RCA lesion is 100% stenosed.  A drug-eluting stent was successfully placed using a STENT RESOLUTE ONYX 4.0X26.  Post intervention, there is a 0% residual stenosis.  3rd RPL lesion is 60% stenosed.  Mid RCA to Dist RCA lesion is 30% stenosed.  Prox LAD lesion is 25% stenosed.  Mid LAD lesion is 30% stenosed.  Mid Cx lesion is 40% stenosed.  LV end diastolic pressure is mildly elevated.  1. Single vessel occlusive CAD with 100% very large RCA proximally with left to right collaterals. 2. Mildly elevated LVEDP 16 mm Hg 3. Successful PCI of the proximal RCA with DES x 1. Plan: aggressive risk factor modification. DAPT for one year. Anticipate DC in am.    Cardiac Studies   Echo 10/24/2019 1. Left ventricular ejection fraction, by estimation, is 60 to 65%. The  left ventricle has normal function. The left ventricle has no regional  wall motion abnormalities. Left ventricular diastolic parameters were  normal.  2. Right ventricular systolic function  is normal. The right ventricular  size is normal.  3. The mitral valve is normal in structure. No evidence of mitral valve  regurgitation. No evidence of mitral stenosis.  4. The aortic valve is normal in structure. Aortic valve regurgitation is  not visualized. No aortic stenosis is present.  5. The inferior vena cava is normal in size with greater than 50%  respiratory variability, suggesting right atrial pressure of 3 mmHg.    Cath 10/26/2019  Prox RCA lesion is 100% stenosed.  A drug-eluting stent was successfully placed using a STENT RESOLUTE ONYX 4.0X26.  Post intervention, there is a 0% residual stenosis.  3rd RPL lesion is 60% stenosed.  Mid RCA to Dist RCA lesion is 30% stenosed.  Prox LAD lesion is 25% stenosed.  Mid LAD lesion is 30% stenosed.  Mid Cx lesion is 40% stenosed.  LV end diastolic pressure is mildly elevated.   1. Single vessel occlusive CAD with 100% very large RCA proximally with left to right collaterals.  2. Mildly elevated LVEDP 16 mm Hg 3. Successful PCI of the proximal RCA with DES x 1.  Plan: aggressive risk factor modification. DAPT for one year. Anticipate DC in am.   Patient Profile     59 y.o. male with PMH of PAD s/p R to L femoral bypass, HTN, HLD and ongoing tobacco use presented with chest pain and labile HTN and ruled in for NSTEMI.  Assessment & Plan    1. NSTEMI  - Echo 10/24/2019 EF 60-65%  - Myoview 07/27/2019 normal EF without ischemia  - Cath 10/26/2019 showed 100% prox RCA treated with DES x1, 60% 3rd RPL, 30% mid RCA, 25% prox LAD, 30% mid LAD, 40% mid LCx.   - continue on ASA and Brilinta.   - likely can be discharged today once seen by Dr. Claiborne Billings.   - emphasis has been placed on compliance with DAPT. Patient is aware we would prefer to delay his upcoming back surgery for at least 6 month, after that, he will need to discuss with primary cardiologist before holding Brilinta for the surgery  2. PAD  - s/p recent L to R  femoral femoral bypass 09/14/2019  - seen by vascular surgery on 4/25 for L groin incision dehiscence  - started on abx, currently on IV Ancef yesterday. Clinical pharmacist spoke with Dr. Oneida Alar, no need for oral abx going home  3. HTN  - continue coreg and benicar, consolidate amlodipine and Imdur by D/C imdur  and increase amlodipine to 10mg  daily.   4. HLD: home Zocor switched to Crestor for better lipid control  5. Prediabetes: hemoglobin A1C 5.8 on 09/16/2019  6. Tobacco abuse: cessation discussed, go home on nicotine patches    For questions or updates, please contact [facility] Please consult [email or web address] for contact info under        Signed, 09/18/2019, PA  10/27/2019, 8:05 AM      Patient seen and examined. Agree with assessment and plan.  Cath data reviewed.  Status post successful PCI to total proximal RCA occlusion with successful stenting.  Right radial cath site stable.  No recurrent chest pain.  Discussed need to defer his back surgery and importance of absolute smoking cessation.  Now on rosuvastatin in place of simvastatin for more aggressive lipid control.  Okay for discharge today.  Patient would like to be followed up in [place].  Should arrange post hospitalization cardiology follow-up in [place].    10/29/2019, MD, [facility] 10/27/2019 10:54 AM

## 2019-10-27 NOTE — TOC Benefit Eligibility Note (Signed)
Transition of Care [facility]) Benefit Eligibility Note    Patient Details  Name: Anthony Todd. MRN: [id number] Date of Birth: 07/30/1960   Medication/Dose: BRILINTA   90 MG BID  Covered?: Yes  Tier: 3 Drug  Prescription Coverage Preferred Pharmacy: [facility]  and   [facility]  Spoke with Person/Company/Phone Number:: [employer] @  [employer] # [phone]  Co-Pay: $47.00  Prior Approval: No  Deductible: Met  Additional Notes: TICAGRELOR : [name] Phone Number: 10/27/2019, 11:08 AM

## 2019-10-27 NOTE — Care Management (Signed)
4401 10-27-19 Benefits check submitted for Brilinta. Case Manager will follow for cost. No further needs from Case Manager at this time. Gala Lewandowsky, RN,BSN Case Manager

## 2019-10-27 NOTE — Progress Notes (Signed)
CARDIAC REHAB PHASE I   PRE:  Rate/Rhythm: 85 SR    BP: sitting 157/98    SaO2: 96 RA  MODE:  Ambulation: 400 ft   POST:  Rate/Rhythm: 95 SR    BP: sitting 160/107     SaO2: 98 RA  Pt able to ambulate without CP or SOB. C/o back pain, which is normal for him with distance. BP elevated, got meds 20 min prior.   Planned to discuss education with pt however he declined receiving printed materials. I stated that I could talk with him, as I had planned, as I know that some are more auditory learners. Unfortunately it seemed pt became offended and asked me to leave. Therefore pt received no education (MI, stent, Brilinta, restrictions, smoking cessation, diet, exercise, NTG, CRPII). I will place CRPII referral for [facility] Hawking and notify RN of lack of education.  [phone]  Harriet Masson CES, ACSM 10/27/2019 9:24 AM

## 2019-10-27 NOTE — Discharge Summary (Signed)
Discharge Summary    Patient ID: Anthony Todd.,  MRN: [id number], DOB/AGE: 58-27-1962 58 y.o.  Admit date: 10/23/2019 Discharge date: 10/27/2019  Primary Care Provider: Benita Stabile Primary Cardiologist: Thurmon Fair, MD ( Long term follow up in [place])  Discharge Diagnoses    Principal Problem:   NSTEMI (non-ST elevated myocardial infarction) [facility]) Active Problems:   PAD (peripheral artery disease) (HCC)   Tobacco abuse   Hyperlipidemia   Hypertension   Allergies Allergies  Allergen Reactions  . Cymbalta [Duloxetine Hcl] Rash    Diagnostic Studies/Procedures    Cath: 10/26/19   Prox RCA lesion is 100% stenosed.  A drug-eluting stent was successfully placed using a STENT RESOLUTE ONYX 4.0X26.  Post intervention, there is a 0% residual stenosis.  3rd RPL lesion is 60% stenosed.  Mid RCA to Dist RCA lesion is 30% stenosed.  Prox LAD lesion is 25% stenosed.  Mid LAD lesion is 30% stenosed.  Mid Cx lesion is 40% stenosed.  LV end diastolic pressure is mildly elevated.   1. Single vessel occlusive CAD with 100% very large RCA proximally with left to right collaterals.  2. Mildly elevated LVEDP 16 mm Hg 3. Successful PCI of the proximal RCA with DES x 1.  Plan: aggressive risk factor modification. DAPT for one year. Anticipate DC in am.   Diagnostic Dominance: Right  Intervention    Echo: 10/24/19  IMPRESSIONS    1. Left ventricular ejection fraction, by estimation, is 60 to 65%. The  left ventricle has normal function. The left ventricle has no regional  wall motion abnormalities. Left ventricular diastolic parameters were  normal.  2. Right ventricular systolic function is normal. The right ventricular  size is normal.  3. The mitral valve is normal in structure. No evidence of mitral valve  regurgitation. No evidence of mitral stenosis.  4. The aortic valve is normal in structure. Aortic valve regurgitation is  not  visualized. No aortic stenosis is present.  5. The inferior vena cava is normal in size with greater than 50%  respiratory variability, suggesting right atrial pressure of 3 mmHg.  _____________   History of Present Illness     Anthony Todd is a 59 yo male with PMH s/p right to left femoral bypass, HL, HTN and ongoing tobacco use. He reported being in his usual state of health until he underwent peripheral bypass surgery in March. He recovered slowly from the surgery but started noticing significant fluctuations in his blood pressure after surgery. He previously had no difficulties with blood pressure and typically runs ~130s/80s. He started to develop intermittent episodes of chest pressure with blood pressures ~200/100. He had been following closely with his PCP due to this and has been started on amlodipine , olmesartan , nitro PRN and most recently imdur . He has also been given protonix due to concerns that this could be related to reflux but he has not been taking this as he does not think it makes a difference. Lastly, he has also been using clonidine 0.2mg  PRN for these episodes of high blood pressure and chest pain. He stated that these episodes occur at random. They occasionally occur with exertion but not all the time. He felt the chest pain first and then would take his blood pressure which would be elevated. He denied any chest pressure or shortness of breath when his blood pressure was controlled. He denied any palpitations, lightheadedness, dizziness, lower leg swelling, orthopnea or PND. He described  the chest pressure as right-sided and central and radiating down both of his arms. He had been compliant with all of his medications. He typically would take a SL nitro when the chest pressure occurred and noted that his blood pressure and chest pressure slowly subside.  He was admitted to cardiology for further management and started on IV heparin.  Hospital Course     Consultants:  VVS  High-sensitivity troponin peaked at 542.  Underwent cardiac cath showing single-vessel occlusive CAD with 100% lesion in the proximal RCA with left-to-right collaterals.  Had successful PCI of the proximal RCA with DES x1.  Did have mild nonobstructive disease noted in other vessels.  Plan to be treated medically.  Placed on DAPT with aspirin and Brilinta for at least 1 year.  Follow-up echocardiogram showed EF of 60 to 65% with no wall motion abnormalities.  Did have recent left to right fem-fem bypass on 09/14/2019 and was seen by vascular surgery during this admission for a left groin incision dehiscence.  Was started on antibiotics.  We will plan to DC at discharge.  He was continued on beta-blocker, ARB.  Initially placed on Imdur but this was stopped post cath and amlodipine was increased to 10 mg daily.  Home Zocor was switched to Crestor.  Noted to be prediabetic with a hemoglobin A1c of 5.8.  Tobacco cessation was discussed, and patient preferred to use nicotine patches at the time of discharge.  Worked well with cardiac rehab without recurrent chest pain the day following cath.   Anthony PontesLee Ernest Rohlfs Jr. was seen by Dr. Tresa EndoKelly and determined stable for discharge home. Follow up in the office has been arranged. Medications are listed below.   _____________  Discharge Vitals Blood pressure (!) 157/98, pulse 79, temperature (!) 97.5 F (36.4 C), temperature source Oral, resp. rate 20, height 5\' 9"  (1.753 m), weight 112 kg, SpO2 97 %.  Filed Weights   10/25/19 2012 10/26/19 0342 10/27/19 0355  Weight: 110 kg 109.6 kg 112 kg    Labs & Radiologic Studies    CBC Recent Labs    10/26/19 0235 10/27/19 0324  WBC 8.2 9.3  HGB 14.3 14.6  HCT 42.1 42.2  MCV 89.0 88.1  PLT 200 204   Basic Metabolic Panel Recent Labs    29/56/2104/26/21 0235 10/27/19 0324  NA 139 139  K 4.2 3.8  CL 107 107  CO2 23 23  GLUCOSE 109* 145*  BUN 15 11  CREATININE 0.94 0.95  CALCIUM 9.1 9.0   Liver Function  Tests No results for input(s): AST, ALT, ALKPHOS, BILITOT, PROT, ALBUMIN in the last 72 hours. No results for input(s): LIPASE, AMYLASE in the last 72 hours. Cardiac Enzymes No results for input(s): CKTOTAL, CKMB, CKMBINDEX, TROPONINI in the last 72 hours. BNP Invalid input(s): POCBNP D-Dimer No results for input(s): DDIMER in the last 72 hours. Hemoglobin A1C No results for input(s): HGBA1C in the last 72 hours. Fasting Lipid Panel No results for input(s): CHOL, HDL, LDLCALC, TRIG, CHOLHDL, LDLDIRECT in the last 72 hours. Thyroid Function Tests No results for input(s): TSH, T4TOTAL, T3FREE, THYROIDAB in the last 72 hours.  Invalid input(s): FREET3 _____________  DG Chest 2 View  Result Date: 10/23/2019 CLINICAL DATA:  Chest pain. EXAM: CHEST - 2 VIEW COMPARISON:  September 24, 2019 FINDINGS: The heart size and mediastinal contours are within normal limits. Both lungs are clear. The visualized skeletal structures are unremarkable. IMPRESSION: No active cardiopulmonary disease. Electronically Signed   By: Waylan Rocherhaddeus  [place] M.D.   On: 10/23/2019 21:15   CT ANGIO CHEST PE W OR WO CONTRAST  Result Date: 10/24/2019 CLINICAL DATA:  Shortness of breath and chest pain for 2 weeks he EXAM: CT ANGIOGRAPHY CHEST WITH CONTRAST TECHNIQUE: Multidetector CT imaging of the chest was performed using the standard protocol during bolus administration of intravenous contrast. Multiplanar CT image reconstructions and MIPs were obtained to evaluate the vascular anatomy. CONTRAST:  OMNIPAQUE IOHEXOL 350 MG/ML SOLN COMPARISON:  September 25, 2019 FINDINGS: Cardiovascular: There is a optimal opacification of the pulmonary arteries. There is no central,segmental, or subsegmental filling defects within the pulmonary arteries. The heart is normal in size. No pericardial effusion or thickening. No evidence right heart strain. There is normal three-vessel brachiocephalic anatomy without proximal stenosis. The thoracic aorta  is normal in appearance. Mild aortic arch calcifications are seen. Scattered coronary artery calcifications are seen. Mediastinum/Nodes: No hilar, mediastinal, or axillary adenopathy. Thyroid gland, trachea, and esophagus demonstrate no significant findings. Lungs/Pleura: There is minimal ground-glass atelectasis at both lung bases. No pleural effusion or pneumothorax. No airspace consolidation. Upper Abdomen: No acute abnormalities present in the visualized portions of the upper abdomen. Musculoskeletal: No chest wall abnormality. No acute or significant osseous findings. Review of the MIP images confirms the above findings. IMPRESSION: No central, segmental, or subsegmental pulmonary embolism. No other acute intrathoracic pathology to explain the patient's symptoms. Aortic Atherosclerosis (ICD10-I70.0). Electronically Signed   By: Jonna Clark M.D.   On: 10/24/2019 01:23   CARDIAC CATHETERIZATION  Result Date: 10/26/2019  Prox RCA lesion is 100% stenosed.  A drug-eluting stent was successfully placed using a STENT RESOLUTE ONYX 4.0X26.  Post intervention, there is a 0% residual stenosis.  3rd RPL lesion is 60% stenosed.  Mid RCA to Dist RCA lesion is 30% stenosed.  Prox LAD lesion is 25% stenosed.  Mid LAD lesion is 30% stenosed.  Mid Cx lesion is 40% stenosed.  LV end diastolic pressure is mildly elevated.  1. Single vessel occlusive CAD with 100% very large RCA proximally with left to right collaterals. 2. Mildly elevated LVEDP 16 mm Hg 3. Successful PCI of the proximal RCA with DES x 1. Plan: aggressive risk factor modification. DAPT for one year. Anticipate DC in am.   ECHOCARDIOGRAM COMPLETE  Result Date: 10/24/2019    ECHOCARDIOGRAM REPORT   Patient Name:   Arhum Peeples [facility] Date of Exam: 10/24/2019 Medical Rec #:  [id number]             Height:       69.0 in Accession #:    [id number]            Weight:       244.5 lb Date of Birth:  [birth year]             BSA:          2.250 m Patient  Age:    59 years              BP:           111/71 mmHg Patient Gender: M                     HR:           80 bpm. Exam Location:  Inpatient Procedure: 2D Echo Indications:    NSTEMI I21.4  History:        Patient has no prior history of Echocardiogram examinations.  Risk Factors:Dyslipidemia and Current Smoker.  Sonographer:    Celene Skeen RDCS (AE) Referring Phys: [phone] AMANDA C CONIGLIO IMPRESSIONS  1. Left ventricular ejection fraction, by estimation, is 60 to 65%. The left ventricle has normal function. The left ventricle has no regional wall motion abnormalities. Left ventricular diastolic parameters were normal.  2. Right ventricular systolic function is normal. The right ventricular size is normal.  3. The mitral valve is normal in structure. No evidence of mitral valve regurgitation. No evidence of mitral stenosis.  4. The aortic valve is normal in structure. Aortic valve regurgitation is not visualized. No aortic stenosis is present.  5. The inferior vena cava is normal in size with greater than 50% respiratory variability, suggesting right atrial pressure of 3 mmHg. FINDINGS  Left Ventricle: Left ventricular ejection fraction, by estimation, is 60 to 65%. The left ventricle has normal function. The left ventricle has no regional wall motion abnormalities. The left ventricular internal cavity size was normal in size. There is  no left ventricular hypertrophy. Left ventricular diastolic parameters were normal. Normal left ventricular filling pressure. Right Ventricle: The right ventricular size is normal. No increase in right ventricular wall thickness. Right ventricular systolic function is normal. Left Atrium: Left atrial size was normal in size. Right Atrium: Right atrial size was normal in size. Pericardium: There is no evidence of pericardial effusion. Mitral Valve: The mitral valve is normal in structure. Normal mobility of the mitral valve leaflets. No evidence of mitral valve  regurgitation. No evidence of mitral valve stenosis. Tricuspid Valve: The tricuspid valve is normal in structure. Tricuspid valve regurgitation is not demonstrated. No evidence of tricuspid stenosis. Aortic Valve: The aortic valve is normal in structure. Aortic valve regurgitation is not visualized. No aortic stenosis is present. Pulmonic Valve: The pulmonic valve was normal in structure. Pulmonic valve regurgitation is not visualized. No evidence of pulmonic stenosis. Aorta: The aortic root is normal in size and structure. Venous: The inferior vena cava is normal in size with greater than 50% respiratory variability, suggesting right atrial pressure of 3 mmHg. IAS/Shunts: No atrial level shunt detected by color flow Doppler.  LEFT VENTRICLE PLAX 2D LVIDd:         4.70 cm  Diastology LVIDs:         2.80 cm  LV e' lateral:   10.30 cm/s LV PW:         1.00 cm  LV E/e' lateral: 8.4 LV IVS:        1.00 cm  LV e' medial:    5.77 cm/s LVOT diam:     2.20 cm  LV E/e' medial:  14.9 LV SV:         75 LV SV Index:   33 LVOT Area:     3.80 cm  LEFT ATRIUM             Index       RIGHT ATRIUM           Index LA diam:        3.50 cm 1.56 cm/m  RA Area:     14.90 cm LA Vol (A2C):   74.1 ml 32.93 ml/m RA Volume:   31.20 ml  13.87 ml/m LA Vol (A4C):   52.2 ml 23.20 ml/m LA Biplane Vol: 62.7 ml 27.87 ml/m  AORTIC VALVE LVOT Vmax:   114.00 cm/s LVOT Vmean:  87.600 cm/s LVOT VTI:    0.198 m  AORTA Ao Root diam: 3.10 cm MITRAL VALVE  MV Area (PHT): 3.34 cm    SHUNTS MV Decel Time: 227 msec    Systemic VTI:  0.20 m MV E velocity: 86.10 cm/s  Systemic Diam: 2.20 cm MV A velocity: 64.30 cm/s MV E/A ratio:  1.34 Mihai Croitoru MD Electronically signed by Sanda Klein MD Signature Date/Time: 10/24/2019/6:40:35 PM    Final    US SCROTUM W/DOPPLER  Result Date: 10/02/2019 CLINICAL DATA:  Edema of male genitalia, swelling post femoral femoral bypass surgery EXAM: SCROTAL ULTRASOUND DOPPLER ULTRASOUND OF THE TESTICLES TECHNIQUE:  Complete ultrasound examination of the testicles, epididymis, and other scrotal structures was performed. Color and spectral Doppler ultrasound were also utilized to evaluate blood flow to the testicles. COMPARISON:  None FINDINGS: Right testicle Measurements: 5.2 x 2.5 x 3.2 cm. Normal echogenicity without calcification. Small nonspecific hypoechoic nodule is identified within the RIGHT testis on longitudinal images 4 x 3 mm in size, not definitely seen on transverse images. No additional masses. Internal blood flow present on color Doppler imaging. Left testicle Measurements: 4.9 x 2.1 x 3.0 cm. Normal echogenicity without mass or calcification. Internal blood flow present on color Doppler imaging. Right epididymis:  Normal in size and appearance. Left epididymis:  Normal in size and appearance. Hydrocele:  Minimal RIGHT hydrocele Varicocele:  None definitely visualized Pulsed Doppler interrogation of both testes demonstrates normal low resistance arterial and venous waveforms bilaterally. IMPRESSION: 4 x 3 mm hypoechoic nodule within RIGHT testis, nonspecific; follow-up ultrasound recommended in 3 months to determine persistence. Minimal RIGHT hydrocele. No other sonographic abnormalities. Electronically Signed   By: Lavonia Dana M.D.   On: 10/02/2019 14:29   Disposition   Pt is being discharged home today in good condition.  Follow-up Plans & Appointments     Discharge Instructions    Amb Referral to Cardiac Rehabilitation   Complete by: As directed    Diagnosis:  Coronary Stents NSTEMI PTCA     After initial evaluation and assessments completed: Virtual Based Care may be provided alone or in conjunction with Phase 2 Cardiac Rehab based on patient barriers.: Yes   Call MD for:  redness, tenderness, or signs of infection (pain, swelling, redness, odor or green/yellow discharge around incision site)   Complete by: As directed    Diet - low sodium heart healthy   Complete by: As directed     Discharge instructions   Complete by: As directed    PLEASE DO NOT [place]!!!!! Also keep a log of you blood pressures and bring back to your follow up appt. Please call the office with any questions.   Patients taking blood thinners should generally stay away from medicines like ibuprofen, Advil, Motrin, naproxen, and Aleve due to risk of stomach bleeding. You may take Tylenol as directed or talk to your primary doctor about alternatives.   Increase activity slowly   Complete by: As directed        Discharge Medications     Medication List    STOP taking these medications   cloNIDine 0.2 MG tablet Commonly known as: CATAPRES   isosorbide mononitrate 30 MG 24 hr tablet Commonly known as: IMDUR   simvastatin 20 MG tablet Commonly known as: ZOCOR Replaced by: rosuvastatin 20 MG tablet     TAKE these medications   amLODipine 10 MG tablet Commonly known as: NORVASC Take 1 tablet (10 mg total) by mouth at bedtime. What changed:   medication strength  how much to take   aspirin EC 81  MG tablet Take 81 mg by mouth at bedtime.   carvedilol 6.25 MG tablet Commonly known as: COREG Take 1 tablet (6.25 mg total) by mouth 2 (two) times daily with a meal.   diazepam 5 MG tablet Commonly known as: VALIUM Take 5 mg by mouth daily as needed for anxiety.   nicotine 14 mg/24hr patch Commonly known as: NICODERM CQ - dosed in mg/24 hours Place 1 patch (14 mg total) onto the skin daily. Start taking on: October 28, 2019   nitroGLYCERIN 0.4 MG SL tablet Commonly known as: NITROSTAT Place 1 tablet under the tongue every 5 (five) minutes as needed for chest pain. Up to 3 doses.   olmesartan 20 MG tablet Commonly known as: BENICAR Take 20 mg by mouth daily.   omeprazole 40 MG capsule Commonly known as: PRILOSEC Take 40 mg by mouth at bedtime.   oxyCODONE-acetaminophen 5-325 MG tablet Commonly known as: PERCOCET/ROXICET Take 1 tablet by mouth every 6  (six) hours as needed. What changed: reasons to take this   rosuvastatin 20 MG tablet Commonly known as: CRESTOR Take 1 tablet (20 mg total) by mouth daily. Start taking on: October 28, 2019 Replaces: simvastatin 20 MG tablet   tamsulosin 0.4 MG Caps capsule Commonly known as: FLOMAX Take 0.4 mg by mouth at bedtime.   ticagrelor 90 MG Tabs tablet Commonly known as: BRILINTA Take 1 tablet (90 mg total) by mouth 2 (two) times daily.        Yes                               AHA/ACC Clinical Performance & Quality Measures: 1. Aspirin prescribed? - Yes 2. ADP Receptor Inhibitor (Plavix/Clopidogrel, Brilinta/Ticagrelor or Effient/Prasugrel) prescribed (includes medically managed patients)? - Yes 3. Beta Blocker prescribed? - Yes 4. High Intensity Statin (Lipitor 40-80mg  or Crestor 20-40mg ) prescribed? - Yes 5. EF assessed during THIS hospitalization? - Yes 6. For EF <40%, was ACEI/ARB prescribed? - Yes 7. For EF <40%, Aldosterone Antagonist (Spironolactone or Eplerenone) prescribed? - Not Applicable (EF >/= 40%) 8. Cardiac Rehab Phase II ordered (Included Medically managed Patients)? - Yes      Outstanding Labs/Studies   FLP/LFTs in 8 weeks  Duration of Discharge Encounter   Greater than 30 minutes including physician time.  Signed, Laverda Page NP-C 10/27/2019, 11:14 AM

## 2019-10-27 NOTE — Plan of Care (Signed)

## 2019-10-28 ENCOUNTER — Encounter (HOSPITAL_COMMUNITY): Payer: Medicare Other

## 2019-10-28 ENCOUNTER — Other Ambulatory Visit (HOSPITAL_COMMUNITY): Payer: Self-pay | Admitting: Respiratory Therapy

## 2019-10-28 DIAGNOSIS — I25119 Atherosclerotic heart disease of native coronary artery with unspecified angina pectoris: Secondary | ICD-10-CM | POA: Diagnosis not present

## 2019-10-28 DIAGNOSIS — E785 Hyperlipidemia, unspecified: Secondary | ICD-10-CM | POA: Diagnosis not present

## 2019-10-28 DIAGNOSIS — Z720 Tobacco use: Secondary | ICD-10-CM | POA: Diagnosis not present

## 2019-10-28 DIAGNOSIS — I10 Essential (primary) hypertension: Secondary | ICD-10-CM | POA: Diagnosis not present

## 2019-10-28 DIAGNOSIS — I708 Atherosclerosis of other arteries: Secondary | ICD-10-CM | POA: Diagnosis not present

## 2019-10-28 DIAGNOSIS — I214 Non-ST elevation (NSTEMI) myocardial infarction: Secondary | ICD-10-CM | POA: Diagnosis not present

## 2019-10-28 DIAGNOSIS — I739 Peripheral vascular disease, unspecified: Secondary | ICD-10-CM | POA: Diagnosis not present

## 2019-10-28 DIAGNOSIS — Z7982 Long term (current) use of aspirin: Secondary | ICD-10-CM | POA: Diagnosis not present

## 2019-10-28 DIAGNOSIS — T82322A Displacement of femoral arterial graft (bypass), initial encounter: Secondary | ICD-10-CM | POA: Diagnosis not present

## 2019-10-28 DIAGNOSIS — Z48812 Encounter for surgical aftercare following surgery on the circulatory system: Secondary | ICD-10-CM | POA: Diagnosis not present

## 2019-11-02 ENCOUNTER — Ambulatory Visit (INDEPENDENT_AMBULATORY_CARE_PROVIDER_SITE_OTHER)
Admission: RE | Admit: 2019-11-02 | Discharge: 2019-11-02 | Disposition: A | Discharge status: Home / Self Care | Payer: Medicare Other | Source: Ambulatory Visit | Attending: Surgery | Admitting: Surgery

## 2019-11-02 ENCOUNTER — Ambulatory Visit (HOSPITAL_COMMUNITY)
Admission: RE | Admit: 2019-11-02 | Discharge: 2019-11-02 | Disposition: A | Discharge status: Home / Self Care | Payer: Medicare Other | Source: Ambulatory Visit | Attending: Surgery | Admitting: Surgery

## 2019-11-02 ENCOUNTER — Other Ambulatory Visit: Payer: Self-pay

## 2019-11-02 DIAGNOSIS — I771 Stricture of artery: Secondary | ICD-10-CM

## 2019-11-02 DIAGNOSIS — I739 Peripheral vascular disease, unspecified: Secondary | ICD-10-CM | POA: Diagnosis not present

## 2019-11-04 DIAGNOSIS — I10 Essential (primary) hypertension: Secondary | ICD-10-CM | POA: Diagnosis not present

## 2019-11-04 DIAGNOSIS — Z7982 Long term (current) use of aspirin: Secondary | ICD-10-CM | POA: Diagnosis not present

## 2019-11-04 DIAGNOSIS — E785 Hyperlipidemia, unspecified: Secondary | ICD-10-CM | POA: Diagnosis not present

## 2019-11-04 DIAGNOSIS — I214 Non-ST elevation (NSTEMI) myocardial infarction: Secondary | ICD-10-CM | POA: Diagnosis not present

## 2019-11-04 DIAGNOSIS — I2510 Atherosclerotic heart disease of native coronary artery without angina pectoris: Secondary | ICD-10-CM | POA: Diagnosis not present

## 2019-11-04 DIAGNOSIS — I739 Peripheral vascular disease, unspecified: Secondary | ICD-10-CM | POA: Diagnosis not present

## 2019-11-04 DIAGNOSIS — Z720 Tobacco use: Secondary | ICD-10-CM | POA: Diagnosis not present

## 2019-11-05 ENCOUNTER — Ambulatory Visit (INDEPENDENT_AMBULATORY_CARE_PROVIDER_SITE_OTHER): Payer: Self-pay | Admitting: Vascular Surgery

## 2019-11-05 ENCOUNTER — Ambulatory Visit: Payer: Medicare Other | Admitting: Internal Medicine

## 2019-11-05 ENCOUNTER — Other Ambulatory Visit: Payer: Self-pay

## 2019-11-05 ENCOUNTER — Encounter: Payer: Self-pay | Admitting: Internal Medicine

## 2019-11-05 ENCOUNTER — Encounter: Payer: Self-pay | Admitting: Vascular Surgery

## 2019-11-05 VITALS — BP 132/66 | HR 83 | Temp 98.1°F | Ht 69.0 in | Wt 243.0 lb

## 2019-11-05 VITALS — BP 112/72 | HR 71 | Temp 97.3°F | Resp 20 | Ht 69.0 in | Wt 242.0 lb

## 2019-11-05 DIAGNOSIS — E782 Mixed hyperlipidemia: Secondary | ICD-10-CM | POA: Diagnosis not present

## 2019-11-05 DIAGNOSIS — I739 Peripheral vascular disease, unspecified: Secondary | ICD-10-CM

## 2019-11-05 NOTE — Progress Notes (Deleted)
Cardiology Office Note:    Date:  11/05/2019   ID:  Anthony Todd., DOB [birth year], MRN [id number]  PCP:  Benita Stabile, MD  Cardiologist:  Thurmon Fair, MD   Referring MD: Benita Stabile, MD   No chief complaint on file. ***  History of Present Illness:    Anthony Todd. is a 59 y.o. male with a hx of PAD, HTN, tobacco abuse, iliac artery stenosis, CAD s/p STEMI s/p DES to RCA shortly after fem-fem bypass, and HLD.   He underwent fem-fem bypass 09/14/19. Since that time, he complained of labile BP and intermittent chest pain. Elevated BP in the 200s self-treated with PRN clonidine. Chest pressure and HTN relieved with nitro. He presented to [facility] with elevated hs troponin that peaked at 542. Heart cath on 10/26/19 revealed 100% lesion in the proximal RCA with left-to-right collaterals, successfully treated with DES x 1. He tolerated the procedure well and was discharged on DAPT, statin, BB, and ARB. He was recently seen by Dr. Ladona Ridgel 11/05/19 and noted the patient believed his PVD intervention caused his MI. He followed up with [facility] 11/05/19 and denied claudication.   He presents today for follow up.       CAD s/p DES to RCA 10/26/19 - continue DAPT - continue statin, BB, ARB   Hypertension - continue medications as above in addition to 10 mg amlodipine   Hyperlipidemia 09/16/2019: Cholesterol 154; HDL 21; LDL Cholesterol 92; Triglycerides 204; VLDL 41 - on 20 mg crestor - will recheck lipids in 4 weeks   Current tobacco smoker        Past Medical History:  Diagnosis Date  . AAA (abdominal aortic aneurysm) (HCC)   . Back pain   . Hyperlipidemia   . Myocardial infarction (HCC)   . PAD (peripheral artery disease) (HCC)   . Stenosis of iliac artery [facility])     Past Surgical History:  Procedure Laterality Date  . ABDOMINAL AORTOGRAM W/LOWER EXTREMITY N/A 06/05/2019   Procedure: ABDOMINAL AORTOGRAM W/LOWER EXTREMITY;  Surgeon: Sherren Kerns, MD;  [facility] IN[facility]AS[facility] CV [facility];  Service: Cardiovascular;  Laterality: N/A;  . BACK SURGERY    . CORONARY STENT INTERVENTION N/A 10/26/2019   Procedure: CORONARY STENT INTERVENTION;  Surgeon: Swaziland, Peter M, MD;  Location: [facility]VASIVE CV LAB;  Service: Cardiovascular;  Laterality: N/A;  . FEMORAL ARTERY - FEMORAL ARTERY BYPASS GRAFT  09/14/2019  . FEMORAL-FEMORAL BYPASS GRAFT Bilateral 09/14/2019   Procedure: LEFT TO RIGHT BYPASS GRAFT FEMORAL-FEMORAL ARTERY USING HEMASHIELD GOLD GRAFT;  Surgeon: Sherren Kerns, MD;  Location: [facility] OR;  Service: Vascular;  Laterality: Bilateral;  . LEFT HEART CATH AND CORONARY ANGIOGRAPHY N/A 10/26/2019   Procedure: LEFT HEART CATH AND CORONARY ANGIOGRAPHY;  Surgeon: Swaziland, Peter M, MD;  Location: [facility] INVASIVE CV LAB;  Service: Cardiovascular;  Laterality: N/A;  . PERIPHERAL VASCULAR BALLOON ANGIOPLASTY Left 06/05/2019   Procedure: PERIPHERAL VASCULAR BALLOON ANGIOPLASTY;  Surgeon: Sherren Kerns, MD;  Location: [facility] INVASIVE CV LAB;  Service: Cardiovascular;  Laterality: Left;  Iliac  . TONSILLECTOMY      Current Medications: No outpatient medications have been marked as taking for the 11/11/19 encounter (Appointment) with Marcelino Duster, PA.     Allergies:   Cymbalta [duloxetine hcl]   Social History   Socioeconomic History  . Marital status: Married    Spouse name: Not on file  . Number of children: Not on file  . Years of education: Not  on file  . Highest education level: Not on file  Occupational History  . Not on file  Tobacco Use  . Smoking status: Current Every Day Smoker    Packs/day: 2.00    Types: Cigarettes  . Smokeless tobacco: Never Used  Substance and Sexual Activity  . Alcohol use: Yes    Comment: socially  . Drug use: Not Currently  . Sexual activity: Not on file  Other Topics Concern  . Not on file  Social History Narrative  . Not on file   Social Determinants of Health   Financial Resource Strain:   . Difficulty of Paying  Living Expenses:   Food Insecurity:   . Worried About Charity fundraiser in the Last Year:   . Arboriculturist in the Last Year:   Transportation Needs:   . Film/video editor (Medical):   Marland Kitchen Lack of Transportation (Non-Medical):   Physical Activity:   . Days of Exercise per Week:   . Minutes of Exercise per Session:   Stress:   . Feeling of Stress :   Social Connections:   . Frequency of Communication with Friends and Family:   . Frequency of Social Gatherings with Friends and Family:   . Attends Religious Services:   . Active Member of Clubs or Organizations:   . Attends Archivist Meetings:   Marland Kitchen Marital Status:      Family History: The patient's ***family history includes Cancer in his father; Diabetes in his mother.  ROS:   Please see the history of present illness.    *** All other systems reviewed and are negative.  EKGs/Labs/Other Studies Reviewed:    The following studies were reviewed today:  Cath: 10/26/19  Prox RCA lesion is 100% stenosed.  A drug-eluting stent was successfully placed using a STENT RESOLUTE ONYX 4.0X26.  Post intervention, there is a 0% residual stenosis.  3rd RPL lesion is 60% stenosed.  Mid RCA to Dist RCA lesion is 30% stenosed.  Prox LAD lesion is 25% stenosed.  Mid LAD lesion is 30% stenosed.  Mid Cx lesion is 40% stenosed.  LV end diastolic pressure is mildly elevated.  1. Single vessel occlusive CAD with 100% very large RCA proximally with left to right collaterals.  2. Mildly elevated LVEDP 16 mm Hg 3. Successful PCI of the proximal RCA with DES x 1.  Plan: aggressive risk factor modification. DAPT for one year. Anticipate DC in am.   Diagnostic Dominance: Right  Intervention    Echo: 10/24/19  IMPRESSIONS  1. Left ventricular ejection fraction, by estimation, is 60 to 65%. The  left ventricle has normal function. The left ventricle has no regional  wall motion abnormalities. Left ventricular  diastolic parameters were  normal.  2. Right ventricular systolic function is normal. The right ventricular  size is normal.  3. The mitral valve is normal in structure. No evidence of mitral valve  regurgitation. No evidence of mitral stenosis.  4. The aortic valve is normal in structure. Aortic valve regurgitation is  not visualized. No aortic stenosis is present.  5. The inferior vena cava is normal in size with greater than 50%  respiratory variability, suggesting right atrial pressure of 3 mmHg.   EKG:  EKG is *** ordered today.  The ekg ordered today demonstrates ***  Recent Labs: 09/09/2019: ALT 52 10/23/2019: B Natriuretic Peptide 59.6 10/24/2019: TSH 1.180 10/27/2019: BUN 11; Creatinine, Ser 0.95; Hemoglobin 14.6; Platelets 204; Potassium 3.8; Sodium 139  Recent Lipid Panel    Component Value Date/Time   CHOL 154 09/16/2019 0408   TRIG 204 (H) 09/16/2019 0408   HDL 21 (L) 09/16/2019 0408   CHOLHDL 7.3 09/16/2019 0408   VLDL 41 (H) 09/16/2019 0408   LDLCALC 92 09/16/2019 0408    Physical Exam:    VS:  There were no vitals taken for this visit.    Wt Readings from Last 3 Encounters:  11/05/19 242 lb (109.8 kg)  11/05/19 243 lb (110.2 kg)  10/27/19 246 lb 14.6 oz (112 kg)     GEN: *** Well nourished, well developed in no acute distress HEENT: Normal NECK: No JVD; No carotid bruits LYMPHATICS: No lymphadenopathy CARDIAC: ***RRR, no murmurs, rubs, gallops RESPIRATORY:  Clear to auscultation without rales, wheezing or rhonchi  ABDOMEN: Soft, non-tender, non-distended MUSCULOSKELETAL:  No edema; No deformity  SKIN: Warm and dry NEUROLOGIC:  Alert and oriented x 3 PSYCHIATRIC:  Normal affect   ASSESSMENT:    No diagnosis found. PLAN:    In order of problems listed above:  No diagnosis found.   Medication Adjustments/Labs and Tests Ordered: Current medicines are reviewed at length with the patient today.  Concerns regarding medicines are outlined above.    No orders of the defined types were placed in this encounter.  No orders of the defined types were placed in this encounter.   Signed, Marcelino Duster, PA  11/05/2019 2:27 PM    [facility]

## 2019-11-05 NOTE — Progress Notes (Signed)
Patient is a 59 year old male who returns for postoperative follow-up today.  Underwent right left femoral-femoral bypass September 14, 2019.  He was last seen April 15 complaining of some separation of his groin incision as well as scrotal edema.  He was also having some anxiety and shortness of breath at that time.  He had coronary artery stenting of his LAD by Dr. Swaziland October 26, 2019.  He has no claudication symptoms at this point.  He still complains of some patchy numbness on the right medial calf.  He has no significant groin incision drainage at this point.  He still has some occasional left groin pain.  Physical exam:  Vitals:   11/05/19 1112  BP: 112/72  Pulse: 71  Resp: 20  Temp: (!) 97.3 F (36.3 C)  SpO2: 93%  Weight: 242 lb (109.8 kg)  Height: 5\' 9"  (1.753 m)    Extremities: Well-healed groin incisions bilaterally no drainage no erythema  GU: Still with swollen penis but minimal scrotal edema at this point.  Data: Patient had bilateral ABIs performed on May 3 as well as an ultrasound of his femoral-femoral bypass.  I reviewed and interpreted these today.  Right and left ABIs were 0.8.   Assessment: Slowly improving status post femoral-femoral bypass for claudication symptoms.  Plan: Patient will have a follow-up ABI and see one of our APP is in 6 months.  He will follow-up sooner if he has any difficulty.  He was reassured regarding the scrotal edema but this should continue to improve with time.  He was encouraged to wear an athletic supporter to reduce swelling.  He does still have chronic back issues but this has been put on hold for further evaluation until he is off of dual antiplatelet therapy for his recent drug-eluting coronary stent.  May 5, MD [facility] Office: [phone]

## 2019-11-05 NOTE — Patient Instructions (Signed)
Medication Instructions:  Your physician recommends that you continue on your current medications as directed. Please refer to the Current Medication list given to you today.  *If you need a refill on your cardiac medications before your next appointment, please call your pharmacy*   Lab Work: Your physician recommends that you return for lab work in: 3 MONTHS ( August)   If you have labs (blood work) drawn today and your tests are completely normal, you will receive your results only by: Marland Kitchen MyChart Message (if you have MyChart) OR . A paper copy in the mail If you have any lab test that is abnormal or we need to change your treatment, we will call you to review the results.   Testing/Procedures: NONE    Follow-Up: At [facility], you and your health needs are our priority.  As part of our continuing mission to provide you with exceptional heart care, we have created designated Provider Care Teams.  These Care Teams include your primary Cardiologist (physician) and Advanced Practice Providers (APPs -  Physician Assistants and Nurse Practitioners) who all work together to provide you with the care you need, when you need it.  We recommend signing up for the patient portal called "MyChart".  Sign up information is provided on this After Visit Summary.  MyChart is used to connect with patients for Virtual Visits (Telemedicine).  Patients are able to view lab/test results, encounter notes, upcoming appointments, etc.  Non-urgent messages can be sent to your provider as well.   To learn more about what you can do with MyChart, go to [email or web address].    Your next appointment:   6 month(s)  The format for your next appointment:   In Person  Provider:   Lewayne Bunting, MD   Other Instructions Thank you for choosing [facility]!

## 2019-11-05 NOTE — Progress Notes (Signed)
HPI Anthony Todd returns today for followup. He is a 59 yo man with a h/o peripheral vascular disease and CAD, s/p recent STEMI who underwent successful PCI and stenting of his RCA. He previously underwent Fem-fem bypass. He is still smoking! He denies chest pain or sob. He did not complain of claudication today. He has not had syncope. He is angry thinking that something occurred during his fem-fem bypass that caused his heart attack. I tried to encourage him otherwise. He has some mild peripheral edema.  Allergies  Allergen Reactions  . Cymbalta [Duloxetine Hcl] Rash     Current Outpatient Medications  Medication Sig Dispense Refill  . amLODipine (NORVASC) 10 MG tablet Take 1 tablet (10 mg total) by mouth at bedtime. 90 tablet 0  . aspirin EC 81 MG tablet Take 81 mg by mouth at bedtime.    . carvedilol (COREG) 6.25 MG tablet Take 1 tablet (6.25 mg total) by mouth 2 (two) times daily with a meal. 180 tablet 0  . diazepam (VALIUM) 5 MG tablet Take 5 mg by mouth daily as needed for anxiety.     . nicotine (NICODERM CQ - DOSED IN MG/24 HOURS) 14 mg/24hr patch Place 1 patch (14 mg total) onto the skin daily. 28 patch 0  . nitroGLYCERIN (NITROSTAT) 0.4 MG SL tablet Place 1 tablet under the tongue every 5 (five) minutes as needed for chest pain. Up to 3 doses.    Marland Kitchen olmesartan (BENICAR) 20 MG tablet Take 20 mg by mouth daily.    Marland Kitchen omeprazole (PRILOSEC) 40 MG capsule Take 40 mg by mouth at bedtime.    Marland Kitchen oxyCODONE-acetaminophen (PERCOCET/ROXICET) 5-325 MG tablet Take 1 tablet by mouth every 6 (six) hours as needed. (Patient taking differently: Take 1 tablet by mouth every 6 (six) hours as needed for moderate pain. ) 20 tablet 0  . rosuvastatin (CRESTOR) 20 MG tablet Take 1 tablet (20 mg total) by mouth daily. 90 tablet 0  . tamsulosin (FLOMAX) 0.4 MG CAPS capsule Take 0.4 mg by mouth at bedtime.    . ticagrelor (BRILINTA) 90 MG TABS tablet Take 1 tablet (90 mg total) by mouth 2 (two) times  daily. 180 tablet 2   No current facility-administered medications for this visit.     Past Medical History:  Diagnosis Date  . AAA (abdominal aortic aneurysm) ([place])   . Back pain   . Hyperlipidemia   . PAD (peripheral artery disease) ([place])   . Stenosis of iliac artery (HCC)     ROS:   All systems reviewed and negative except as noted in the HPI.   Past Surgical History:  Procedure Laterality Date  . ABDOMINAL AORTOGRAM W/LOWER EXTREMITY N/A 06/05/2019   Procedure: ABDOMINAL AORTOGRAM W/LOWER EXTREMITY;  Surgeon: Elam Dutch, MD;  Location: [place] CV LAB;  Service: Cardiovascular;  Laterality: N/A;  . BACK SURGERY    . CORONARY STENT INTERVENTION N/A 10/26/2019   Procedure: CORONARY STENT INTERVENTION;  Surgeon: Martinique, Peter M, MD;  Location: [place] CV LAB;  Service: Cardiovascular;  Laterality: N/A;  . FEMORAL ARTERY - FEMORAL ARTERY BYPASS GRAFT  09/14/2019  . FEMORAL-FEMORAL BYPASS GRAFT Bilateral 09/14/2019   Procedure: LEFT TO RIGHT BYPASS GRAFT FEMORAL-FEMORAL ARTERY USING HEMASHIELD GOLD GRAFT;  Surgeon: Elam Dutch, MD;  Location: [place];  Service: Vascular;  Laterality: Bilateral;  . LEFT HEART CATH AND CORONARY ANGIOGRAPHY N/A 10/26/2019   Procedure: LEFT HEART CATH AND CORONARY ANGIOGRAPHY;  Surgeon: Martinique, Peter  M, MD;  Location: [facility] INVASIVE CV LAB;  Service: Cardiovascular;  Laterality: N/A;  . PERIPHERAL VASCULAR BALLOON ANGIOPLASTY Left 06/05/2019   Procedure: PERIPHERAL VASCULAR BALLOON ANGIOPLASTY;  Surgeon: Sherren Kerns, MD;  Location: [facility] INVASIVE CV LAB;  Service: Cardiovascular;  Laterality: Left;  Iliac  . TONSILLECTOMY       Family History  Problem Relation Age of Onset  . Diabetes Mother   . Cancer Father      Social History   Socioeconomic History  . Marital status: Married    Spouse name: Not on file  . Number of children: Not on file  . Years of education: Not on file  . Highest education level: Not on file    Occupational History  . Not on file  Tobacco Use  . Smoking status: Current Every Day Smoker    Packs/day: 2.00    Types: Cigarettes  . Smokeless tobacco: Never Used  Substance and Sexual Activity  . Alcohol use: Yes    Comment: socially  . Drug use: Not Currently  . Sexual activity: Not on file  Other Topics Concern  . Not on file  Social History Narrative  . Not on file   Social Determinants of Health   Financial Resource Strain:   . Difficulty of Paying Living Expenses:   Food Insecurity:   . Worried About Programme researcher, broadcasting/film/video in the Last Year:   . Barista in the Last Year:   Transportation Needs:   . Freight forwarder (Medical):   Marland Kitchen Lack of Transportation (Non-Medical):   Physical Activity:   . Days of Exercise per Week:   . Minutes of Exercise per Session:   Stress:   . Feeling of Stress :   Social Connections:   . Frequency of Communication with Friends and Family:   . Frequency of Social Gatherings with Friends and Family:   . Attends Religious Services:   . Active Member of Clubs or Organizations:   . Attends Banker Meetings:   Marland Kitchen Marital Status:   Intimate Partner Violence:   . Fear of Current or Ex-Partner:   . Emotionally Abused:   Marland Kitchen Physically Abused:   . Sexually Abused:      BP 132/66   Pulse 83   Temp 98.1 F (36.7 C)   Ht 5\' 9"  (1.753 m)   Wt 243 lb (110.2 kg)   SpO2 97%   BMI 35.88 kg/m   Physical Exam:  Well appearing NAD HEENT: Unremarkable Neck:  No JVD, no thyromegally Lymphatics:  No adenopathy Back:  No CVA tenderness Lungs:  Clear with no wheezes HEART:  Regular rate rhythm, no murmurs, no rubs, no clicks Abd:  soft, positive bowel sounds, no organomegally, no rebound, no guarding Ext:  2 plus pulses, no edema, no cyanosis, no clubbing Skin:  No rashes no nodules Neuro:  CN II through XII intact, motor grossly intact   Assess/Plan: 1. CAD - he denies anginal symptoms.  2. Peripheral vascular  disease - he is s/p Fem-fem bypass. He denies claudication. 3. HTN - his bp is controlled today. We will follow. 4. Dyslipidemia - he will undergo fasting lipids in 3 months. He will continue his statin  .D.

## 2019-11-05 NOTE — Telephone Encounter (Signed)
Seen by Dr.taylor in office today.

## 2019-11-09 ENCOUNTER — Other Ambulatory Visit: Payer: Self-pay | Admitting: *Deleted

## 2019-11-09 DIAGNOSIS — I714 Abdominal aortic aneurysm, without rupture, unspecified: Secondary | ICD-10-CM

## 2019-11-09 DIAGNOSIS — I739 Peripheral vascular disease, unspecified: Secondary | ICD-10-CM

## 2019-11-11 ENCOUNTER — Ambulatory Visit: Payer: Medicare Other | Admitting: Physician Assistant

## 2019-11-12 DIAGNOSIS — Z720 Tobacco use: Secondary | ICD-10-CM | POA: Diagnosis not present

## 2019-11-12 DIAGNOSIS — E785 Hyperlipidemia, unspecified: Secondary | ICD-10-CM | POA: Diagnosis not present

## 2019-11-12 DIAGNOSIS — Z7982 Long term (current) use of aspirin: Secondary | ICD-10-CM | POA: Diagnosis not present

## 2019-11-12 DIAGNOSIS — I214 Non-ST elevation (NSTEMI) myocardial infarction: Secondary | ICD-10-CM | POA: Diagnosis not present

## 2019-11-12 DIAGNOSIS — I2510 Atherosclerotic heart disease of native coronary artery without angina pectoris: Secondary | ICD-10-CM | POA: Diagnosis not present

## 2019-11-12 DIAGNOSIS — I739 Peripheral vascular disease, unspecified: Secondary | ICD-10-CM | POA: Diagnosis not present

## 2019-11-12 DIAGNOSIS — I10 Essential (primary) hypertension: Secondary | ICD-10-CM | POA: Diagnosis not present

## 2019-11-18 DIAGNOSIS — Z7982 Long term (current) use of aspirin: Secondary | ICD-10-CM | POA: Diagnosis not present

## 2019-11-18 DIAGNOSIS — I214 Non-ST elevation (NSTEMI) myocardial infarction: Secondary | ICD-10-CM | POA: Diagnosis not present

## 2019-11-18 DIAGNOSIS — I10 Essential (primary) hypertension: Secondary | ICD-10-CM | POA: Diagnosis not present

## 2019-11-18 DIAGNOSIS — I739 Peripheral vascular disease, unspecified: Secondary | ICD-10-CM | POA: Diagnosis not present

## 2019-11-18 DIAGNOSIS — E785 Hyperlipidemia, unspecified: Secondary | ICD-10-CM | POA: Diagnosis not present

## 2019-11-18 DIAGNOSIS — Z720 Tobacco use: Secondary | ICD-10-CM | POA: Diagnosis not present

## 2019-11-18 DIAGNOSIS — I2510 Atherosclerotic heart disease of native coronary artery without angina pectoris: Secondary | ICD-10-CM | POA: Diagnosis not present

## 2019-11-20 ENCOUNTER — Other Ambulatory Visit (HOSPITAL_COMMUNITY)
Admission: RE | Admit: 2019-11-20 | Discharge: 2019-11-20 | Disposition: A | Discharge status: Home / Self Care | Payer: Medicare Other | Source: Ambulatory Visit | Attending: Internal Medicine | Admitting: Internal Medicine

## 2019-11-20 ENCOUNTER — Other Ambulatory Visit: Payer: Self-pay

## 2019-11-20 DIAGNOSIS — Z20822 Contact with and (suspected) exposure to covid-19: Secondary | ICD-10-CM | POA: Insufficient documentation

## 2019-11-20 DIAGNOSIS — Z01812 Encounter for preprocedural laboratory examination: Secondary | ICD-10-CM | POA: Diagnosis not present

## 2019-11-20 LAB — SARS CORONAVIRUS 2 (TAT 6-24 HRS): SARS Coronavirus 2: NEGATIVE

## 2019-11-23 DIAGNOSIS — R600 Localized edema: Secondary | ICD-10-CM | POA: Diagnosis not present

## 2019-11-23 DIAGNOSIS — I739 Peripheral vascular disease, unspecified: Secondary | ICD-10-CM | POA: Diagnosis not present

## 2019-11-24 ENCOUNTER — Other Ambulatory Visit (HOSPITAL_COMMUNITY): Payer: Self-pay | Admitting: Internal Medicine

## 2019-11-24 ENCOUNTER — Other Ambulatory Visit: Payer: Self-pay | Admitting: Internal Medicine

## 2019-11-24 ENCOUNTER — Ambulatory Visit (HOSPITAL_COMMUNITY): Admission: RE | Admit: 2019-11-24 | Payer: Medicare Other | Source: Ambulatory Visit

## 2019-11-24 DIAGNOSIS — N5089 Other specified disorders of the male genital organs: Secondary | ICD-10-CM

## 2019-11-24 DIAGNOSIS — K469 Unspecified abdominal hernia without obstruction or gangrene: Secondary | ICD-10-CM

## 2019-11-25 DIAGNOSIS — I10 Essential (primary) hypertension: Secondary | ICD-10-CM | POA: Diagnosis not present

## 2019-11-25 DIAGNOSIS — E785 Hyperlipidemia, unspecified: Secondary | ICD-10-CM | POA: Diagnosis not present

## 2019-11-25 DIAGNOSIS — I214 Non-ST elevation (NSTEMI) myocardial infarction: Secondary | ICD-10-CM | POA: Diagnosis not present

## 2019-11-25 DIAGNOSIS — I739 Peripheral vascular disease, unspecified: Secondary | ICD-10-CM | POA: Diagnosis not present

## 2019-11-25 DIAGNOSIS — Z7982 Long term (current) use of aspirin: Secondary | ICD-10-CM | POA: Diagnosis not present

## 2019-11-25 DIAGNOSIS — Z720 Tobacco use: Secondary | ICD-10-CM | POA: Diagnosis not present

## 2019-11-25 DIAGNOSIS — I2510 Atherosclerotic heart disease of native coronary artery without angina pectoris: Secondary | ICD-10-CM | POA: Diagnosis not present

## 2019-12-04 DIAGNOSIS — Z7982 Long term (current) use of aspirin: Secondary | ICD-10-CM | POA: Diagnosis not present

## 2019-12-04 DIAGNOSIS — I214 Non-ST elevation (NSTEMI) myocardial infarction: Secondary | ICD-10-CM | POA: Diagnosis not present

## 2019-12-04 DIAGNOSIS — I2510 Atherosclerotic heart disease of native coronary artery without angina pectoris: Secondary | ICD-10-CM | POA: Diagnosis not present

## 2019-12-04 DIAGNOSIS — I739 Peripheral vascular disease, unspecified: Secondary | ICD-10-CM | POA: Diagnosis not present

## 2019-12-04 DIAGNOSIS — E785 Hyperlipidemia, unspecified: Secondary | ICD-10-CM | POA: Diagnosis not present

## 2019-12-04 DIAGNOSIS — I10 Essential (primary) hypertension: Secondary | ICD-10-CM | POA: Diagnosis not present

## 2019-12-04 DIAGNOSIS — Z720 Tobacco use: Secondary | ICD-10-CM | POA: Diagnosis not present

## 2019-12-07 DIAGNOSIS — E785 Hyperlipidemia, unspecified: Secondary | ICD-10-CM | POA: Diagnosis not present

## 2019-12-07 DIAGNOSIS — Z720 Tobacco use: Secondary | ICD-10-CM | POA: Diagnosis not present

## 2019-12-07 DIAGNOSIS — Z7982 Long term (current) use of aspirin: Secondary | ICD-10-CM | POA: Diagnosis not present

## 2019-12-07 DIAGNOSIS — I214 Non-ST elevation (NSTEMI) myocardial infarction: Secondary | ICD-10-CM | POA: Diagnosis not present

## 2019-12-07 DIAGNOSIS — I739 Peripheral vascular disease, unspecified: Secondary | ICD-10-CM | POA: Diagnosis not present

## 2019-12-07 DIAGNOSIS — I2510 Atherosclerotic heart disease of native coronary artery without angina pectoris: Secondary | ICD-10-CM | POA: Diagnosis not present

## 2019-12-07 DIAGNOSIS — I10 Essential (primary) hypertension: Secondary | ICD-10-CM | POA: Diagnosis not present

## 2019-12-14 ENCOUNTER — Ambulatory Visit (HOSPITAL_COMMUNITY): Payer: Medicare Other

## 2019-12-29 ENCOUNTER — Encounter (HOSPITAL_COMMUNITY): Payer: Self-pay

## 2019-12-29 ENCOUNTER — Ambulatory Visit (HOSPITAL_COMMUNITY): Payer: Medicare Other

## 2020-01-05 ENCOUNTER — Other Ambulatory Visit (HOSPITAL_COMMUNITY): Payer: Self-pay | Admitting: Internal Medicine

## 2020-01-05 ENCOUNTER — Other Ambulatory Visit: Payer: Self-pay | Admitting: Internal Medicine

## 2020-01-05 DIAGNOSIS — N50811 Right testicular pain: Secondary | ICD-10-CM

## 2020-01-05 DIAGNOSIS — R600 Localized edema: Secondary | ICD-10-CM

## 2020-01-20 DIAGNOSIS — R5383 Other fatigue: Secondary | ICD-10-CM | POA: Diagnosis not present

## 2020-01-20 DIAGNOSIS — J3089 Other allergic rhinitis: Secondary | ICD-10-CM | POA: Diagnosis not present

## 2020-01-20 DIAGNOSIS — Z23 Encounter for immunization: Secondary | ICD-10-CM | POA: Diagnosis not present

## 2020-01-20 DIAGNOSIS — E782 Mixed hyperlipidemia: Secondary | ICD-10-CM | POA: Diagnosis not present

## 2020-01-20 DIAGNOSIS — F17290 Nicotine dependence, other tobacco product, uncomplicated: Secondary | ICD-10-CM | POA: Diagnosis not present

## 2020-01-20 DIAGNOSIS — R7301 Impaired fasting glucose: Secondary | ICD-10-CM | POA: Diagnosis not present

## 2020-02-02 DIAGNOSIS — F17200 Nicotine dependence, unspecified, uncomplicated: Secondary | ICD-10-CM | POA: Diagnosis not present

## 2020-02-02 DIAGNOSIS — R7301 Impaired fasting glucose: Secondary | ICD-10-CM | POA: Diagnosis not present

## 2020-02-02 DIAGNOSIS — M545 Low back pain: Secondary | ICD-10-CM | POA: Diagnosis not present

## 2020-02-02 DIAGNOSIS — I714 Abdominal aortic aneurysm, without rupture: Secondary | ICD-10-CM | POA: Diagnosis not present

## 2020-02-25 DIAGNOSIS — G894 Chronic pain syndrome: Secondary | ICD-10-CM | POA: Diagnosis not present

## 2020-02-25 DIAGNOSIS — Z79891 Long term (current) use of opiate analgesic: Secondary | ICD-10-CM | POA: Diagnosis not present

## 2020-02-25 DIAGNOSIS — I25119 Atherosclerotic heart disease of native coronary artery with unspecified angina pectoris: Secondary | ICD-10-CM | POA: Diagnosis not present

## 2020-02-25 DIAGNOSIS — I739 Peripheral vascular disease, unspecified: Secondary | ICD-10-CM | POA: Diagnosis not present

## 2020-04-20 ENCOUNTER — Encounter: Payer: Self-pay | Admitting: Internal Medicine

## 2020-04-20 ENCOUNTER — Other Ambulatory Visit: Payer: Self-pay

## 2020-04-20 ENCOUNTER — Ambulatory Visit: Payer: Medicare Other | Admitting: Internal Medicine

## 2020-04-20 VITALS — BP 140/82 | HR 76 | Ht 69.0 in | Wt 218.0 lb

## 2020-04-20 DIAGNOSIS — Z720 Tobacco use: Secondary | ICD-10-CM | POA: Diagnosis not present

## 2020-04-20 DIAGNOSIS — Z01818 Encounter for other preprocedural examination: Secondary | ICD-10-CM | POA: Diagnosis not present

## 2020-04-20 NOTE — Patient Instructions (Signed)
Medication Instructions:  Your physician recommends that you continue on your current medications as directed. Please refer to the Current Medication list given to you today.  *If you need a refill on your cardiac medications before your next appointment, please call your pharmacy*   Lab Work: NONE   If you have labs (blood work) drawn today and your tests are completely normal, you will receive your results only by: . MyChart Message (if you have MyChart) OR . A paper copy in the mail If you have any lab test that is abnormal or we need to change your treatment, we will call you to review the results.   Testing/Procedures: NONE    Follow-Up: At [facility], you and your health needs are our priority.  As part of our continuing mission to provide you with exceptional heart care, we have created designated Provider Care Teams.  These Care Teams include your primary Cardiologist (physician) and Advanced Practice Providers (APPs -  Physician Assistants and Nurse Practitioners) who all work together to provide you with the care you need, when you need it.  We recommend signing up for the patient portal called "MyChart".  Sign up information is provided on this After Visit Summary.  MyChart is used to connect with patients for Virtual Visits (Telemedicine).  Patients are able to view lab/test results, encounter notes, upcoming appointments, etc.  Non-urgent messages can be sent to your provider as well.   To learn more about what you can do with MyChart, go to [email or web address].    Your next appointment:   1 year(s)  The format for your next appointment:   In Person  Provider:   Gregg Taylor, MD   Other Instructions Thank you for choosing [facility]!    

## 2020-04-20 NOTE — Progress Notes (Signed)
HPI Anthony Todd returns today for followup. He is a pleasant 59 yo man with a h/o HTN, CAD, and peripheral vascular disease and ongoing tobacco abuse. He is bothered by back pain and is pending surgery. He had an MI in 3/21. He has done well since with no chest pain or sob. He has not been able to stop smoking. He has minimal claudication. He describes some numbness in his legs. Allergies  Allergen Reactions  . Cymbalta [Duloxetine Hcl] Rash     Current Outpatient Medications  Medication Sig Dispense Refill  . amLODipine (NORVASC) 10 MG tablet Take 1 tablet (10 mg total) by mouth at bedtime. 90 tablet 0  . aspirin EC 81 MG tablet Take 81 mg by mouth at bedtime.    . carvedilol (COREG) 6.25 MG tablet Take 1 tablet (6.25 mg total) by mouth 2 (two) times daily with a meal. 180 tablet 0  . nitroGLYCERIN (NITROSTAT) 0.4 MG SL tablet Place 1 tablet under the tongue every 5 (five) minutes as needed for chest pain. Up to 3 doses.    Marland Kitchen olmesartan (BENICAR) 20 MG tablet Take 20 mg by mouth daily.    Marland Kitchen oxyCODONE-acetaminophen (PERCOCET) 7.5-325 MG tablet Take 1 tablet by mouth 3 (three) times daily as needed.    . rosuvastatin (CRESTOR) 20 MG tablet Take 1 tablet (20 mg total) by mouth daily. 90 tablet 0  . ticagrelor (BRILINTA) 90 MG TABS tablet Take 1 tablet (90 mg total) by mouth 2 (two) times daily. 180 tablet 2   No current facility-administered medications for this visit.     Past Medical History:  Diagnosis Date  . AAA (abdominal aortic aneurysm) (HCC)   . Back pain   . Hyperlipidemia   . Myocardial infarction (HCC)   . PAD (peripheral artery disease) (HCC)   . Stenosis of iliac artery (HCC)     ROS:   All systems reviewed and negative except as noted in the HPI.   Past Surgical History:  Procedure Laterality Date  . ABDOMINAL AORTOGRAM W/LOWER EXTREMITY N/A 06/05/2019   Procedure: ABDOMINAL AORTOGRAM W/LOWER EXTREMITY;  Surgeon: Sherren Kerns, MD;  Location: [facility]  INVASIVE CV LAB;  Service: Cardiovascular;  Laterality: N/A;  . BACK SURGERY    . CORONARY STENT INTERVENTION N/A 10/26/2019   Procedure: CORONARY STENT INTERVENTION;  Surgeon: Swaziland, Peter M, MD;  Location: [facility] INVASIVE CV LAB;  Service: Cardiovascular;  Laterality: N/A;  . FEMORAL ARTERY - FEMORAL ARTERY BYPASS GRAFT  09/14/2019  . FEMORAL-FEMORAL BYPASS GRAFT Bilateral 09/14/2019   Procedure: LEFT TO RIGHT BYPASS GRAFT FEMORAL-FEMORAL ARTERY USING HEMASHIELD GOLD GRAFT;  Surgeon: Sherren Kerns, MD;  Location: [facility] OR;  Service: Vascular;  Laterality: Bilateral;  . LEFT HEART CATH AND CORONARY ANGIOGRAPHY N/A 10/26/2019   Procedure: LEFT HEART CATH AND CORONARY ANGIOGRAPHY;  Surgeon: Swaziland, Peter M, MD;  Location: [facility] INVASIVE CV LAB;  Service: Cardiovascular;  Laterality: N/A;  . PERIPHERAL VASCULAR BALLOON ANGIOPLASTY Left 06/05/2019   Procedure: PERIPHERAL VASCULAR BALLOON ANGIOPLASTY;  Surgeon: Sherren Kerns, MD;  Location: [facility] INVASIVE CV LAB;  Service: Cardiovascular;  Laterality: Left;  Iliac  . TONSILLECTOMY       Family History  Problem Relation Age of Onset  . Diabetes Mother   . Cancer Father      Social History   Socioeconomic History  . Marital status: Married    Spouse name: Not on file  . Number of children: Not on file  .  Years of education: Not on file  . Highest education level: Not on file  Occupational History  . Not on file  Tobacco Use  . Smoking status: Current Every Day Smoker    Packs/day: 2.00    Types: Cigarettes  . Smokeless tobacco: Never Used  Vaping Use  . Vaping Use: Former  Substance and Sexual Activity  . Alcohol use: Yes    Comment: socially  . Drug use: Not Currently  . Sexual activity: Not on file  Other Topics Concern  . Not on file  Social History Narrative  . Not on file   Social Determinants of Health   Financial Resource Strain:   . Difficulty of Paying Living Expenses: Not on file  Food Insecurity:   . Worried About  Programme researcher, broadcasting/film/video in the Last Year: Not on file  . Ran Out of Food in the Last Year: Not on file  Transportation Needs:   . Lack of Transportation (Medical): Not on file  . Lack of Transportation (Non-Medical): Not on file  Physical Activity:   . Days of Exercise per Week: Not on file  . Minutes of Exercise per Session: Not on file  Stress:   . Feeling of Stress : Not on file  Social Connections:   . Frequency of Communication with Friends and Family: Not on file  . Frequency of Social Gatherings with Friends and Family: Not on file  . Attends Religious Services: Not on file  . Active Member of Clubs or Organizations: Not on file  . Attends Banker Meetings: Not on file  . Marital Status: Not on file  Intimate Partner Violence:   . Fear of Current or Ex-Partner: Not on file  . Emotionally Abused: Not on file  . Physically Abused: Not on file  . Sexually Abused: Not on file     BP 140/82   Pulse 76   Ht 5\' 9"  (1.753 m)   Wt 218 lb (98.9 kg)   SpO2 98%   BMI 32.19 kg/m   Physical Exam:  Well appearing middle aged man, NAD HEENT: Unremarkable Neck:  6 cm JVD, no thyromegally Lymphatics:  No adenopathy Back:  No CVA tenderness Lungs:  Clear with no wheezes HEART:  Regular rate rhythm, no murmurs, no rubs, no clicks Abd:  soft, positive bowel sounds, no organomegally, no rebound, no guarding Ext:  2 plus pulses, no edema, no cyanosis, no clubbing Skin:  No rashes no nodules Neuro:  CN II through XII intact, motor grossly intact  Assess/Plan: 1. CAD - he denies anginal symptoms. He will continue his current meds. 2. Preoperative eval - he is low risk from pending spine surgery. He can stop his brilinta for up to 5 days prior to surgery. 3. Peripheral vascular disease - he has minimal symptoms.  4. Tobacco abuse - I strongly encouraged the patient to stop smoking.  Dawson Albers,MD

## 2020-05-03 DIAGNOSIS — M4726 Other spondylosis with radiculopathy, lumbar region: Secondary | ICD-10-CM | POA: Diagnosis not present

## 2020-05-03 DIAGNOSIS — M961 Postlaminectomy syndrome, not elsewhere classified: Secondary | ICD-10-CM | POA: Diagnosis not present

## 2020-05-03 DIAGNOSIS — M5431 Sciatica, right side: Secondary | ICD-10-CM | POA: Diagnosis not present

## 2020-05-03 DIAGNOSIS — M48062 Spinal stenosis, lumbar region with neurogenic claudication: Secondary | ICD-10-CM | POA: Diagnosis not present

## 2020-05-03 DIAGNOSIS — M5432 Sciatica, left side: Secondary | ICD-10-CM | POA: Diagnosis not present

## 2020-05-05 ENCOUNTER — Other Ambulatory Visit (HOSPITAL_COMMUNITY): Payer: Self-pay | Admitting: Orthopedic Surgery

## 2020-05-05 DIAGNOSIS — M4726 Other spondylosis with radiculopathy, lumbar region: Secondary | ICD-10-CM

## 2020-05-19 ENCOUNTER — Other Ambulatory Visit: Payer: Self-pay

## 2020-05-19 ENCOUNTER — Ambulatory Visit (HOSPITAL_COMMUNITY)
Admission: RE | Admit: 2020-05-19 | Discharge: 2020-05-19 | Disposition: A | Discharge status: Home / Self Care | Payer: Medicare Other | Source: Ambulatory Visit | Attending: Orthopedic Surgery | Admitting: Orthopedic Surgery

## 2020-05-19 DIAGNOSIS — M545 Low back pain, unspecified: Secondary | ICD-10-CM | POA: Diagnosis not present

## 2020-05-19 DIAGNOSIS — M4726 Other spondylosis with radiculopathy, lumbar region: Secondary | ICD-10-CM | POA: Diagnosis not present

## 2020-05-24 DIAGNOSIS — M5432 Sciatica, left side: Secondary | ICD-10-CM | POA: Diagnosis not present

## 2020-05-24 DIAGNOSIS — M5431 Sciatica, right side: Secondary | ICD-10-CM | POA: Diagnosis not present

## 2020-05-24 DIAGNOSIS — M48062 Spinal stenosis, lumbar region with neurogenic claudication: Secondary | ICD-10-CM | POA: Diagnosis not present

## 2020-05-24 DIAGNOSIS — M961 Postlaminectomy syndrome, not elsewhere classified: Secondary | ICD-10-CM | POA: Diagnosis not present

## 2020-05-31 DIAGNOSIS — Z79891 Long term (current) use of opiate analgesic: Secondary | ICD-10-CM | POA: Diagnosis not present

## 2020-05-31 DIAGNOSIS — G894 Chronic pain syndrome: Secondary | ICD-10-CM | POA: Diagnosis not present

## 2020-05-31 DIAGNOSIS — I719 Aortic aneurysm of unspecified site, without rupture: Secondary | ICD-10-CM | POA: Diagnosis not present

## 2020-05-31 DIAGNOSIS — Z23 Encounter for immunization: Secondary | ICD-10-CM | POA: Diagnosis not present

## 2020-05-31 DIAGNOSIS — M5116 Intervertebral disc disorders with radiculopathy, lumbar region: Secondary | ICD-10-CM | POA: Diagnosis not present

## 2020-07-05 ENCOUNTER — Telehealth: Payer: Self-pay | Admitting: *Deleted

## 2020-07-05 NOTE — Telephone Encounter (Signed)
   [facility] Pre-operative Risk Assessment    HEARTCARE STAFF: - Please ensure there is not already an duplicate clearance open for this procedure. - Under Visit Info/Reason for Call, type in Other and utilize the format Clearance MM/DD/YY or Clearance TBD. Do not use dashes or single digits. - If request is for dental extraction, please clarify the # of teeth to be extracted.  Request for surgical clearance:   1. What type of surgery is being performed? L5-S1 REVISION LAMINECTOMY/FUSION/TLIF   2. When is this surgery scheduled? TBD   3. What type of clearance is required (medical clearance vs. Pharmacy clearance to hold med vs. Both)? MEDICAL  4. Are there any medications that need to be held prior to surgery and how long? ASA HOLD x 7 DAYS PRIOR; BRILINTA HOLD x 5 DAYS PRIOR PER PROTOCOL ON CLEARANCE REQUEST   5. Practice name and name of physician performing surgery? [facility]; DR. Audelia Hives [place]   6. What is the office phone number? [phone]   7.   What is the office fax number? [phone]  8.   Anesthesia type (None, local, MAC, general) ? GENERAL   Anthony Todd 07/05/2020, 10:01 AM  _________________________________________________________________   (provider comments below)

## 2020-07-05 NOTE — Telephone Encounter (Signed)
   Called to speak with the patient regarding upcoming procedure. He was seen by Dr. Ladona Ridgel for preoperative clearance 04/2020 at which time he was cleared for spine surgery. Given the duration since this assessment, I would like to speak with the patient to ensure no CV symptoms. Additionally, the procedure should be scheduled as close to his 1 year mark after PCI/Brilinta therapy as possible.  (10/26/19>>09/2020).   Left message to call the office back for further review.   Georgie Chard NP-C [facility] Pager: [phone]

## 2020-07-07 NOTE — Telephone Encounter (Signed)
Pt is returning call.  

## 2020-07-11 NOTE — Telephone Encounter (Signed)
Unable to call patient's phone (?fax number), left VM on wife's phone.

## 2020-07-14 NOTE — Telephone Encounter (Signed)
Patient returning call.

## 2020-07-15 NOTE — Telephone Encounter (Signed)
   Primary Cardiologist: Thurmon Fair, MD  Chart reviewed as part of pre-operative protocol coverage. Patient was contacted 07/15/2020 in reference to pre-operative risk assessment for pending surgery as outlined below.  Anthony Todd. was last seen on 04/20/2020 by Dr. Ladona Ridgel.  Patient was cleared by Dr. Ladona Ridgel to proceed with surgery after holding Brilinta for 5 days.  Since that day, Daruis Swaim. has done well without any chest pain or shortness of breath.  Surgeon is also requesting a 7-day hold on the aspirin, however since the patient just had a PCI in April 2021, he will be high risk to stop both antiplatelet therapy at the same time.  I will verify it again with Dr. Ladona Ridgel to make sure he is okay with holding aspirin as well.  Therefore, based on ACC/AHA guidelines, the patient would be at acceptable risk for the planned procedure without further cardiovascular testing.   The patient was advised that if he develops new symptoms prior to surgery to contact our office to arrange for a follow-up visit, and he verbalized understanding.  Dr. Ladona Ridgel, please route your response to P CV DIV PREOP  Azalee Course, PA 07/15/2020, 1:17 PM

## 2020-07-15 NOTE — Telephone Encounter (Signed)
Left message, aware that patient has tried to call us, I will call him again this afternoon.

## 2020-07-18 NOTE — Telephone Encounter (Signed)
I attempted to call the requesting office to clarify the need to hold ASA. Offices closed today. Will attempt again tomorrow.   Pt is cleared to hold brilinta for 5 days. However, since he is less than 12 months from PCI, would be high risk to also hold ASA.

## 2020-07-19 NOTE — Telephone Encounter (Signed)
Anthony Todd, Dr. Royann Shivers is primary cardiologist. I will defer to him though I think holding ASA probably ok. GT

## 2020-07-19 NOTE — Telephone Encounter (Signed)
Left VM with requesting office requesting clarification on Aspirin hold. Requested call back - instructed to ask for preop APP on call.   Alver Sorrow, NP

## 2020-07-20 NOTE — Telephone Encounter (Signed)
Follow up:     Anthony Todd from [facility] calling and they need to know if the patient need to stop Asprin. Please fax or call.

## 2020-07-21 DIAGNOSIS — M5432 Sciatica, left side: Secondary | ICD-10-CM | POA: Diagnosis not present

## 2020-07-21 DIAGNOSIS — M4807 Spinal stenosis, lumbosacral region: Secondary | ICD-10-CM | POA: Diagnosis not present

## 2020-07-21 DIAGNOSIS — Z4689 Encounter for fitting and adjustment of other specified devices: Secondary | ICD-10-CM | POA: Diagnosis not present

## 2020-07-21 DIAGNOSIS — M961 Postlaminectomy syndrome, not elsewhere classified: Secondary | ICD-10-CM | POA: Diagnosis not present

## 2020-07-21 DIAGNOSIS — Z01812 Encounter for preprocedural laboratory examination: Secondary | ICD-10-CM | POA: Diagnosis not present

## 2020-07-21 DIAGNOSIS — M48062 Spinal stenosis, lumbar region with neurogenic claudication: Secondary | ICD-10-CM | POA: Diagnosis not present

## 2020-07-21 DIAGNOSIS — M5431 Sciatica, right side: Secondary | ICD-10-CM | POA: Diagnosis not present

## 2020-07-21 NOTE — Telephone Encounter (Signed)
Sorry to bounce you around like this, but I am not his primary MD - just saw him once on rounds in hospital. Dr. Allyson Sabal is his primary Cardiologist and I added him to this message to comment on surgical risk and interrupting antiplatelet agents.

## 2020-07-21 NOTE — Telephone Encounter (Signed)
It's been only 8 months since his NSTEMI Rx with PCI DES RCA. I feel comfortable holding Brilenta  but must continue ASA, Not yet at 12 months

## 2020-07-21 NOTE — Telephone Encounter (Signed)
Will forward to pre op as well

## 2020-07-21 NOTE — Telephone Encounter (Signed)
   Primary Cardiologist: Thurmon Fair, MD  Chart reviewed as part of pre-operative protocol coverage. Given past medical history and time since last visit, based on ACC/AHA guidelines, Reda Gettis. would be at acceptable risk for the planned procedure without further cardiovascular testing.   He was last seen 04/20/20 by Dr. Ladona Ridgel. He was contacted via phone on 07/15/2020 and reported he was doing well. The patient was advised that if he develops new symptoms prior to surgery to contact our office to arrange for a follow-up visit, and he verbalized understanding.  He had NSTEMI with PCI and drug eluding stent to the RCA 10/26/19. He is only 8 months from event. As such, per his primary cardiologist he may hold Brilinta prior to procedure but may not hold Aspirin at this time due to risk of stent thrombosis.   I will route this recommendation to the requesting party via Epic fax function and remove from pre-op pool.  Please call with questions.  Alver Sorrow, NP 07/21/2020, 9:20 AM

## 2020-08-01 DIAGNOSIS — M4726 Other spondylosis with radiculopathy, lumbar region: Secondary | ICD-10-CM | POA: Diagnosis not present

## 2020-08-01 DIAGNOSIS — M48062 Spinal stenosis, lumbar region with neurogenic claudication: Secondary | ICD-10-CM | POA: Diagnosis not present

## 2020-08-01 DIAGNOSIS — M4319 Spondylolisthesis, multiple sites in spine: Secondary | ICD-10-CM | POA: Diagnosis not present

## 2020-08-01 DIAGNOSIS — M5116 Intervertebral disc disorders with radiculopathy, lumbar region: Secondary | ICD-10-CM | POA: Diagnosis not present

## 2020-08-01 DIAGNOSIS — G8918 Other acute postprocedural pain: Secondary | ICD-10-CM | POA: Diagnosis not present

## 2020-08-01 DIAGNOSIS — M961 Postlaminectomy syndrome, not elsewhere classified: Secondary | ICD-10-CM | POA: Diagnosis not present

## 2020-08-01 DIAGNOSIS — M5432 Sciatica, left side: Secondary | ICD-10-CM | POA: Diagnosis not present

## 2020-08-01 DIAGNOSIS — M5431 Sciatica, right side: Secondary | ICD-10-CM | POA: Diagnosis not present

## 2020-08-01 DIAGNOSIS — Z981 Arthrodesis status: Secondary | ICD-10-CM | POA: Diagnosis not present

## 2020-08-01 DIAGNOSIS — M5137 Other intervertebral disc degeneration, lumbosacral region: Secondary | ICD-10-CM | POA: Diagnosis not present

## 2020-08-01 DIAGNOSIS — R202 Paresthesia of skin: Secondary | ICD-10-CM | POA: Diagnosis not present

## 2020-08-01 DIAGNOSIS — M4326 Fusion of spine, lumbar region: Secondary | ICD-10-CM | POA: Diagnosis not present

## 2020-08-01 DIAGNOSIS — M4317 Spondylolisthesis, lumbosacral region: Secondary | ICD-10-CM | POA: Diagnosis not present

## 2020-08-02 DIAGNOSIS — M4726 Other spondylosis with radiculopathy, lumbar region: Secondary | ICD-10-CM | POA: Diagnosis not present

## 2020-08-02 DIAGNOSIS — K6389 Other specified diseases of intestine: Secondary | ICD-10-CM | POA: Diagnosis not present

## 2020-08-02 DIAGNOSIS — M4326 Fusion of spine, lumbar region: Secondary | ICD-10-CM | POA: Diagnosis not present

## 2020-08-02 DIAGNOSIS — M961 Postlaminectomy syndrome, not elsewhere classified: Secondary | ICD-10-CM | POA: Diagnosis not present

## 2020-08-02 DIAGNOSIS — Z981 Arthrodesis status: Secondary | ICD-10-CM | POA: Diagnosis not present

## 2020-08-02 DIAGNOSIS — M5116 Intervertebral disc disorders with radiculopathy, lumbar region: Secondary | ICD-10-CM | POA: Diagnosis not present

## 2020-08-02 DIAGNOSIS — Z7409 Other reduced mobility: Secondary | ICD-10-CM | POA: Diagnosis not present

## 2020-08-02 DIAGNOSIS — M48062 Spinal stenosis, lumbar region with neurogenic claudication: Secondary | ICD-10-CM | POA: Diagnosis not present

## 2020-08-02 DIAGNOSIS — M4317 Spondylolisthesis, lumbosacral region: Secondary | ICD-10-CM | POA: Diagnosis not present

## 2020-08-02 DIAGNOSIS — Z789 Other specified health status: Secondary | ICD-10-CM | POA: Diagnosis not present

## 2020-08-08 DIAGNOSIS — I2510 Atherosclerotic heart disease of native coronary artery without angina pectoris: Secondary | ICD-10-CM | POA: Diagnosis not present

## 2020-08-08 DIAGNOSIS — M961 Postlaminectomy syndrome, not elsewhere classified: Secondary | ICD-10-CM | POA: Diagnosis not present

## 2020-08-08 DIAGNOSIS — E785 Hyperlipidemia, unspecified: Secondary | ICD-10-CM | POA: Diagnosis not present

## 2020-08-08 DIAGNOSIS — I252 Old myocardial infarction: Secondary | ICD-10-CM | POA: Diagnosis not present

## 2020-08-08 DIAGNOSIS — R262 Difficulty in walking, not elsewhere classified: Secondary | ICD-10-CM | POA: Diagnosis not present

## 2020-08-08 DIAGNOSIS — I10 Essential (primary) hypertension: Secondary | ICD-10-CM | POA: Diagnosis not present

## 2020-08-08 DIAGNOSIS — Z4789 Encounter for other orthopedic aftercare: Secondary | ICD-10-CM | POA: Diagnosis not present

## 2020-08-11 DIAGNOSIS — M961 Postlaminectomy syndrome, not elsewhere classified: Secondary | ICD-10-CM | POA: Diagnosis not present

## 2020-08-11 DIAGNOSIS — I10 Essential (primary) hypertension: Secondary | ICD-10-CM | POA: Diagnosis not present

## 2020-08-11 DIAGNOSIS — Z4789 Encounter for other orthopedic aftercare: Secondary | ICD-10-CM | POA: Diagnosis not present

## 2020-08-11 DIAGNOSIS — E785 Hyperlipidemia, unspecified: Secondary | ICD-10-CM | POA: Diagnosis not present

## 2020-08-11 DIAGNOSIS — R262 Difficulty in walking, not elsewhere classified: Secondary | ICD-10-CM | POA: Diagnosis not present

## 2020-08-11 DIAGNOSIS — I2510 Atherosclerotic heart disease of native coronary artery without angina pectoris: Secondary | ICD-10-CM | POA: Diagnosis not present

## 2020-08-11 DIAGNOSIS — I252 Old myocardial infarction: Secondary | ICD-10-CM | POA: Diagnosis not present

## 2020-08-15 DIAGNOSIS — I10 Essential (primary) hypertension: Secondary | ICD-10-CM | POA: Diagnosis not present

## 2020-08-15 DIAGNOSIS — Z4789 Encounter for other orthopedic aftercare: Secondary | ICD-10-CM | POA: Diagnosis not present

## 2020-08-15 DIAGNOSIS — M961 Postlaminectomy syndrome, not elsewhere classified: Secondary | ICD-10-CM | POA: Diagnosis not present

## 2020-08-15 DIAGNOSIS — R262 Difficulty in walking, not elsewhere classified: Secondary | ICD-10-CM | POA: Diagnosis not present

## 2020-08-15 DIAGNOSIS — I2510 Atherosclerotic heart disease of native coronary artery without angina pectoris: Secondary | ICD-10-CM | POA: Diagnosis not present

## 2020-08-15 DIAGNOSIS — I252 Old myocardial infarction: Secondary | ICD-10-CM | POA: Diagnosis not present

## 2020-08-15 DIAGNOSIS — E785 Hyperlipidemia, unspecified: Secondary | ICD-10-CM | POA: Diagnosis not present

## 2020-08-17 DIAGNOSIS — E785 Hyperlipidemia, unspecified: Secondary | ICD-10-CM | POA: Diagnosis not present

## 2020-08-17 DIAGNOSIS — I10 Essential (primary) hypertension: Secondary | ICD-10-CM | POA: Diagnosis not present

## 2020-08-17 DIAGNOSIS — M961 Postlaminectomy syndrome, not elsewhere classified: Secondary | ICD-10-CM | POA: Diagnosis not present

## 2020-08-17 DIAGNOSIS — I252 Old myocardial infarction: Secondary | ICD-10-CM | POA: Diagnosis not present

## 2020-08-17 DIAGNOSIS — I2510 Atherosclerotic heart disease of native coronary artery without angina pectoris: Secondary | ICD-10-CM | POA: Diagnosis not present

## 2020-08-17 DIAGNOSIS — Z4789 Encounter for other orthopedic aftercare: Secondary | ICD-10-CM | POA: Diagnosis not present

## 2020-08-17 DIAGNOSIS — R262 Difficulty in walking, not elsewhere classified: Secondary | ICD-10-CM | POA: Diagnosis not present

## 2020-08-22 DIAGNOSIS — M961 Postlaminectomy syndrome, not elsewhere classified: Secondary | ICD-10-CM | POA: Diagnosis not present

## 2020-08-22 DIAGNOSIS — R262 Difficulty in walking, not elsewhere classified: Secondary | ICD-10-CM | POA: Diagnosis not present

## 2020-08-22 DIAGNOSIS — Z4789 Encounter for other orthopedic aftercare: Secondary | ICD-10-CM | POA: Diagnosis not present

## 2020-08-22 DIAGNOSIS — I252 Old myocardial infarction: Secondary | ICD-10-CM | POA: Diagnosis not present

## 2020-08-22 DIAGNOSIS — E785 Hyperlipidemia, unspecified: Secondary | ICD-10-CM | POA: Diagnosis not present

## 2020-08-22 DIAGNOSIS — I2510 Atherosclerotic heart disease of native coronary artery without angina pectoris: Secondary | ICD-10-CM | POA: Diagnosis not present

## 2020-08-22 DIAGNOSIS — I10 Essential (primary) hypertension: Secondary | ICD-10-CM | POA: Diagnosis not present

## 2020-08-23 DIAGNOSIS — Z4789 Encounter for other orthopedic aftercare: Secondary | ICD-10-CM | POA: Diagnosis not present

## 2020-08-23 DIAGNOSIS — M961 Postlaminectomy syndrome, not elsewhere classified: Secondary | ICD-10-CM | POA: Diagnosis not present

## 2020-08-23 DIAGNOSIS — R262 Difficulty in walking, not elsewhere classified: Secondary | ICD-10-CM | POA: Diagnosis not present

## 2020-08-23 DIAGNOSIS — I10 Essential (primary) hypertension: Secondary | ICD-10-CM | POA: Diagnosis not present

## 2020-08-26 DIAGNOSIS — M48062 Spinal stenosis, lumbar region with neurogenic claudication: Secondary | ICD-10-CM | POA: Diagnosis not present

## 2020-08-26 DIAGNOSIS — M4321 Fusion of spine, occipito-atlanto-axial region: Secondary | ICD-10-CM | POA: Diagnosis not present

## 2020-08-26 DIAGNOSIS — M961 Postlaminectomy syndrome, not elsewhere classified: Secondary | ICD-10-CM | POA: Diagnosis not present

## 2020-08-26 DIAGNOSIS — Z981 Arthrodesis status: Secondary | ICD-10-CM | POA: Diagnosis not present

## 2020-08-29 DIAGNOSIS — E785 Hyperlipidemia, unspecified: Secondary | ICD-10-CM | POA: Diagnosis not present

## 2020-08-29 DIAGNOSIS — I10 Essential (primary) hypertension: Secondary | ICD-10-CM | POA: Diagnosis not present

## 2020-08-29 DIAGNOSIS — I2510 Atherosclerotic heart disease of native coronary artery without angina pectoris: Secondary | ICD-10-CM | POA: Diagnosis not present

## 2020-08-29 DIAGNOSIS — Z4789 Encounter for other orthopedic aftercare: Secondary | ICD-10-CM | POA: Diagnosis not present

## 2020-08-29 DIAGNOSIS — R262 Difficulty in walking, not elsewhere classified: Secondary | ICD-10-CM | POA: Diagnosis not present

## 2020-08-29 DIAGNOSIS — M961 Postlaminectomy syndrome, not elsewhere classified: Secondary | ICD-10-CM | POA: Diagnosis not present

## 2020-08-29 DIAGNOSIS — I252 Old myocardial infarction: Secondary | ICD-10-CM | POA: Diagnosis not present

## 2020-09-07 DIAGNOSIS — I2510 Atherosclerotic heart disease of native coronary artery without angina pectoris: Secondary | ICD-10-CM | POA: Diagnosis not present

## 2020-09-07 DIAGNOSIS — M961 Postlaminectomy syndrome, not elsewhere classified: Secondary | ICD-10-CM | POA: Diagnosis not present

## 2020-09-07 DIAGNOSIS — E785 Hyperlipidemia, unspecified: Secondary | ICD-10-CM | POA: Diagnosis not present

## 2020-09-07 DIAGNOSIS — Z4789 Encounter for other orthopedic aftercare: Secondary | ICD-10-CM | POA: Diagnosis not present

## 2020-09-07 DIAGNOSIS — I252 Old myocardial infarction: Secondary | ICD-10-CM | POA: Diagnosis not present

## 2020-09-07 DIAGNOSIS — R262 Difficulty in walking, not elsewhere classified: Secondary | ICD-10-CM | POA: Diagnosis not present

## 2020-09-07 DIAGNOSIS — I10 Essential (primary) hypertension: Secondary | ICD-10-CM | POA: Diagnosis not present

## 2020-09-15 DIAGNOSIS — I252 Old myocardial infarction: Secondary | ICD-10-CM | POA: Diagnosis not present

## 2020-09-15 DIAGNOSIS — E785 Hyperlipidemia, unspecified: Secondary | ICD-10-CM | POA: Diagnosis not present

## 2020-09-15 DIAGNOSIS — I10 Essential (primary) hypertension: Secondary | ICD-10-CM | POA: Diagnosis not present

## 2020-09-15 DIAGNOSIS — I2510 Atherosclerotic heart disease of native coronary artery without angina pectoris: Secondary | ICD-10-CM | POA: Diagnosis not present

## 2020-09-15 DIAGNOSIS — Z4789 Encounter for other orthopedic aftercare: Secondary | ICD-10-CM | POA: Diagnosis not present

## 2020-09-15 DIAGNOSIS — M961 Postlaminectomy syndrome, not elsewhere classified: Secondary | ICD-10-CM | POA: Diagnosis not present

## 2020-09-15 DIAGNOSIS — R262 Difficulty in walking, not elsewhere classified: Secondary | ICD-10-CM | POA: Diagnosis not present

## 2020-09-21 ENCOUNTER — Other Ambulatory Visit: Payer: Self-pay

## 2020-09-21 ENCOUNTER — Encounter (HOSPITAL_BASED_OUTPATIENT_CLINIC_OR_DEPARTMENT_OTHER): Payer: Self-pay | Admitting: Obstetrics and Gynecology

## 2020-09-21 ENCOUNTER — Emergency Department (HOSPITAL_BASED_OUTPATIENT_CLINIC_OR_DEPARTMENT_OTHER): Payer: Medicare Other | Admitting: Radiology

## 2020-09-21 ENCOUNTER — Emergency Department (HOSPITAL_BASED_OUTPATIENT_CLINIC_OR_DEPARTMENT_OTHER)
Admission: EM | Admit: 2020-09-21 | Discharge: 2020-09-21 | Disposition: A | Discharge status: Home / Self Care | Payer: Medicare Other | Attending: Emergency Medicine | Admitting: Emergency Medicine

## 2020-09-21 DIAGNOSIS — M5442 Lumbago with sciatica, left side: Secondary | ICD-10-CM

## 2020-09-21 DIAGNOSIS — Z951 Presence of aortocoronary bypass graft: Secondary | ICD-10-CM | POA: Insufficient documentation

## 2020-09-21 DIAGNOSIS — F17210 Nicotine dependence, cigarettes, uncomplicated: Secondary | ICD-10-CM | POA: Diagnosis not present

## 2020-09-21 DIAGNOSIS — Z7982 Long term (current) use of aspirin: Secondary | ICD-10-CM | POA: Insufficient documentation

## 2020-09-21 DIAGNOSIS — Z79899 Other long term (current) drug therapy: Secondary | ICD-10-CM | POA: Insufficient documentation

## 2020-09-21 DIAGNOSIS — M545 Low back pain, unspecified: Secondary | ICD-10-CM | POA: Diagnosis not present

## 2020-09-21 MED ORDER — HYDROMORPHONE HCL 1 MG/ML IJ SOLN
2.0000 mg | Freq: Once | INTRAMUSCULAR | Status: DC
  Filled 2020-09-21: qty 2

## 2020-09-21 MED ORDER — KETOROLAC TROMETHAMINE 60 MG/2ML IM SOLN
60.0000 mg | Freq: Once | INTRAMUSCULAR | Status: DC
  Filled 2020-09-21: qty 2

## 2020-09-21 MED ORDER — METHOCARBAMOL 1000 MG/10ML IJ SOLN
1000.0000 mg | Freq: Once | INTRAMUSCULAR | Status: AC
  Administered 2020-09-21: 1000 mg via INTRAVENOUS

## 2020-09-21 MED ORDER — KETOROLAC TROMETHAMINE 30 MG/ML IJ SOLN
30.0000 mg | Freq: Once | INTRAMUSCULAR | Status: AC
  Administered 2020-09-21: 30 mg via INTRAVENOUS
  Filled 2020-09-21: qty 1

## 2020-09-21 MED ORDER — METHOCARBAMOL 1000 MG/10ML IJ SOLN
1000.0000 mg | Freq: Once | INTRAMUSCULAR | Status: DC
  Filled 2020-09-21: qty 10

## 2020-09-21 MED ORDER — HYDROMORPHONE HCL 1 MG/ML IJ SOLN
2.0000 mg | Freq: Once | INTRAMUSCULAR | Status: AC
  Administered 2020-09-21: 2 mg via INTRAVENOUS

## 2020-09-21 NOTE — ED Triage Notes (Signed)
Patient reports to the ER for back pain. Patient had rods/pins put in the L4/L5. Patient called his MD and was told to take ore muscle relaxer's, going up to 2 doses per day. Patient denies relief with increase and reports he woke up screaming last night from pain. Patient reports his back is catching and he is having difficulty walking

## 2020-09-21 NOTE — ED Notes (Signed)
Patient transported to X-ray 

## 2020-09-21 NOTE — ED Provider Notes (Signed)
[facility] EMERGENCY DEPARTMENT Provider Note  CSN: [id number] Arrival date & time: 09/21/20 1722    History Chief Complaint  Patient presents with  . Back Pain    HPI  Anthony Todd. is a 60 y.o. male with history of lumbar surgery x 3 including revision of laminectomy with fusion in Jan 2022. He had been doing well but has begun to have back spasms the last few days, worse at night and not improved much with the oxycodone and muscle relaxers he's taking at home (unsure of the name). His spine surgeon recommended increasing his muscle relaxer dose at night but he has not had much relief. He has not had any fever, no incontinence or urinary retention. Pain moves down L leg but no numbness or weakness.    Past Medical History:  Diagnosis Date  . AAA (abdominal aortic aneurysm) (HCC)   . Back pain   . Hyperlipidemia   . Myocardial infarction (HCC)   . PAD (peripheral artery disease) (HCC)   . Stenosis of iliac artery [facility])     Past Surgical History:  Procedure Laterality Date  . ABDOMINAL AORTOGRAM W/LOWER EXTREMITY N/A 06/05/2019   Procedure: ABDOMINAL AORTOGRAM W/LOWER EXTREMITY;  Surgeon: Sherren Kerns, MD;  Location: [facility] INVASIVE CV LAB;  Service: Cardiovascular;  Laterality: N/A;  . BACK SURGERY    . CORONARY STENT INTERVENTION N/A 10/26/2019   Procedure: CORONARY STENT INTERVENTION;  Surgeon: Swaziland, Peter M, MD;  Location[facility] INVASIVE CV LAB;  Service: Cardiovascular;  Laterality: N/A;  . FEMORAL ARTERY - FEMORAL ARTERY BYPASS GRAFT  09/14/2019  . FEMORAL-FEMORAL BYPASS GRAFT Bilateral 09/14/2019   Procedure: LEFT TO RIGHT BYPASS GRAFT FEMORAL-FEMORAL ARTERY USING HEMASHIELD GOLD GRAFT;  Surgeon: Sherren Kerns, MD;  Location: [facility] OR;  Service: Vascular;  Laterality: Bilateral;  . LEFT HEART CATH AND CORONARY ANGIOGRAPHY N/A 10/26/2019   Procedure: LEFT HEART CATH AND CORONARY ANGIOGRAPHY;  Surgeon: Swaziland, Peter M, MD;  Location: [facility] INVASIVE CV LAB;   Service: Cardiovascular;  Laterality: N/A;  . PERIPHERAL VASCULAR BALLOON ANGIOPLASTY Left 06/05/2019   Procedure: PERIPHERAL VASCULAR BALLOON ANGIOPLASTY;  Surgeon: Sherren Kerns, MD;  Location: [facility] INVASIVE CV LAB;  Service: Cardiovascular;  Laterality: Left;  Iliac  . TONSILLECTOMY      Family History  Problem Relation Age of Onset  . Diabetes Mother   . Cancer Father     Social History   Tobacco Use  . Smoking status: Current Every Day Smoker    Packs/day: 2.00    Types: Cigarettes  . Smokeless tobacco: Never Used  Vaping Use  . Vaping Use: Former  Substance Use Topics  . Alcohol use: Yes    Comment: socially  . Drug use: Not Currently     Home Medications Prior to Admission medications   Medication Sig Start Date End Date Taking? Authorizing Provider  amLODipine (NORVASC) 10 MG tablet Take 1 tablet (10 mg total) by mouth at bedtime. 10/27/19  Yes Laverda Page B, NP  aspirin EC 81 MG tablet Take 81 mg by mouth at bedtime.   Yes [provider]  carvedilol (COREG) 6.25 MG tablet Take 1 tablet (6.25 mg total) by mouth 2 (two) times daily with a meal. 10/27/19  Yes Laverda Page B, NP  nitroGLYCERIN (NITROSTAT) 0.4 MG SL tablet Place 1 tablet under the tongue every 5 (five) minutes as needed for chest pain. Up to 3 doses. 10/20/19  Yes [provider]  olmesartan (BENICAR) 20 MG  tablet Take 20 mg by mouth daily. 10/21/19  Yes [provider]  oxyCODONE-acetaminophen (PERCOCET) 7.5-325 MG tablet Take 1 tablet by mouth 3 (three) times daily as needed. 10/28/19  Yes [provider]  rosuvastatin (CRESTOR) 20 MG tablet Take 1 tablet (20 mg total) by mouth daily. 10/28/19  Yes Arty Baumgartner, NP  ticagrelor (BRILINTA) 90 MG TABS tablet Take 1 tablet (90 mg total) by mouth 2 (two) times daily. 10/27/19  Yes Arty Baumgartner, NP     Allergies    Cymbalta [duloxetine hcl]   Review of Systems   Review of Systems A comprehensive  review of systems was completed and negative except as noted in HPI.    Physical Exam BP 123/75 (BP Location: Right Arm)   Pulse 73   Temp 98.2 F (36.8 C) (Oral)   Resp 16   Ht 5\' 9"  (1.753 m)   Wt 107.5 kg   SpO2 97%   BMI 35.00 kg/m   Physical Exam Vitals and nursing note reviewed.  Constitutional:      Appearance: Normal appearance.  HENT:     Head: Normocephalic and atraumatic.     Nose: Nose normal.     Mouth/Throat:     Mouth: Mucous membranes are moist.  Eyes:     Extraocular Movements: Extraocular movements intact.     Conjunctiva/sclera: Conjunctivae normal.  Cardiovascular:     Rate and Rhythm: Normal rate.  Pulmonary:     Effort: Pulmonary effort is normal.     Breath sounds: Normal breath sounds.  Abdominal:     General: Abdomen is flat.     Palpations: Abdomen is soft.     Tenderness: There is no abdominal tenderness.  Musculoskeletal:        General: Tenderness (diffuse lumbar back including midline spine) present. No swelling. Normal range of motion.     Cervical back: Neck supple.     Comments: Surgical incision is c/d/i  Skin:    General: Skin is warm and dry.  Neurological:     General: No focal deficit present.     Mental Status: He is alert.  Psychiatric:        Mood and Affect: Mood normal.      ED Results / Procedures / Treatments   Labs (all labs ordered are listed, but only abnormal results are displayed) Labs Reviewed - No data to display  EKG None   Radiology DG Lumbar Spine Complete  Result Date: 09/21/2020 CLINICAL DATA:  Low back pain radiating down legs EXAM: LUMBAR SPINE - COMPLETE 4+ VIEW COMPARISON:  08/02/2020 FINDINGS: Prior posterior fusion changes, stable. No hardware complicating feature. Normal alignment. Remainder the disc spaces are maintained. IMPRESSION: No acute bony abnormality. Electronically Signed   By: 09/30/2020 M.D.   On: 09/21/2020 20:29    Procedures Procedures  Medications Ordered in the  ED Medications  HYDROmorphone (DILAUDID) injection 2 mg (2 mg Intravenous Given 09/21/20 1945)  methocarbamol (ROBAXIN) injection 1,000 mg (1,000 mg Intravenous Given 09/21/20 1945)  ketorolac (TORADOL) 30 MG/ML injection 30 mg (30 mg Intravenous Given 09/21/20 1945)     MDM Rules/Calculators/A&P MDM Patient here with exacerbation of pain after recent back surgery. Will give IM pain meds/muscle relaxer and check xray to ensure no hardware disruption.  No red flags for cord compression.  ED Course  I have reviewed the triage vital signs and the nursing notes.  Pertinent labs & imaging results that were available during my care of  the patient were reviewed by me and considered in my medical decision making (see chart for details).  Clinical Course as of 09/21/20 2044  Wed Sep 21, 2020  2043 Xrays negative for acute process or hardware complication. Patient reports pain is improved and he is ready to go home. Recommend outpatient follow up in Dr. Hadassah Pais office.  [CS]    Clinical Course User Index [CS] Pollyann Savoy, MD    Final Clinical Impression(s) / ED Diagnoses Final diagnoses:  Bilateral low back pain with left-sided sciatica, unspecified chronicity    Rx / DC Orders ED Discharge Orders    None       Pollyann Savoy, MD 09/21/20 2044

## 2020-09-21 NOTE — ED Notes (Signed)
Pt has slight bilateral edema on ankles

## 2020-09-23 DIAGNOSIS — E785 Hyperlipidemia, unspecified: Secondary | ICD-10-CM | POA: Diagnosis not present

## 2020-09-23 DIAGNOSIS — R262 Difficulty in walking, not elsewhere classified: Secondary | ICD-10-CM | POA: Diagnosis not present

## 2020-09-23 DIAGNOSIS — M961 Postlaminectomy syndrome, not elsewhere classified: Secondary | ICD-10-CM | POA: Diagnosis not present

## 2020-09-23 DIAGNOSIS — I2510 Atherosclerotic heart disease of native coronary artery without angina pectoris: Secondary | ICD-10-CM | POA: Diagnosis not present

## 2020-09-23 DIAGNOSIS — Z4789 Encounter for other orthopedic aftercare: Secondary | ICD-10-CM | POA: Diagnosis not present

## 2020-09-23 DIAGNOSIS — I252 Old myocardial infarction: Secondary | ICD-10-CM | POA: Diagnosis not present

## 2020-09-23 DIAGNOSIS — I10 Essential (primary) hypertension: Secondary | ICD-10-CM | POA: Diagnosis not present

## 2020-09-28 DIAGNOSIS — E785 Hyperlipidemia, unspecified: Secondary | ICD-10-CM | POA: Diagnosis not present

## 2020-09-28 DIAGNOSIS — M961 Postlaminectomy syndrome, not elsewhere classified: Secondary | ICD-10-CM | POA: Diagnosis not present

## 2020-09-28 DIAGNOSIS — R262 Difficulty in walking, not elsewhere classified: Secondary | ICD-10-CM | POA: Diagnosis not present

## 2020-09-28 DIAGNOSIS — Z4789 Encounter for other orthopedic aftercare: Secondary | ICD-10-CM | POA: Diagnosis not present

## 2020-09-28 DIAGNOSIS — I2510 Atherosclerotic heart disease of native coronary artery without angina pectoris: Secondary | ICD-10-CM | POA: Diagnosis not present

## 2020-09-28 DIAGNOSIS — I252 Old myocardial infarction: Secondary | ICD-10-CM | POA: Diagnosis not present

## 2020-09-28 DIAGNOSIS — I10 Essential (primary) hypertension: Secondary | ICD-10-CM | POA: Diagnosis not present

## 2020-11-04 DIAGNOSIS — M48062 Spinal stenosis, lumbar region with neurogenic claudication: Secondary | ICD-10-CM | POA: Diagnosis not present

## 2020-11-04 DIAGNOSIS — M961 Postlaminectomy syndrome, not elsewhere classified: Secondary | ICD-10-CM | POA: Diagnosis not present

## 2020-12-08 IMAGING — DX LUMBAR SPINE - COMPLETE 4+ VIEW
5 series · 5 of 5 positions shown · non-contrast
Comparison: None.

CLINICAL DATA: Lumbago

EXAM:
LUMBAR SPINE - COMPLETE 4+ VIEW

[l-spine ap]
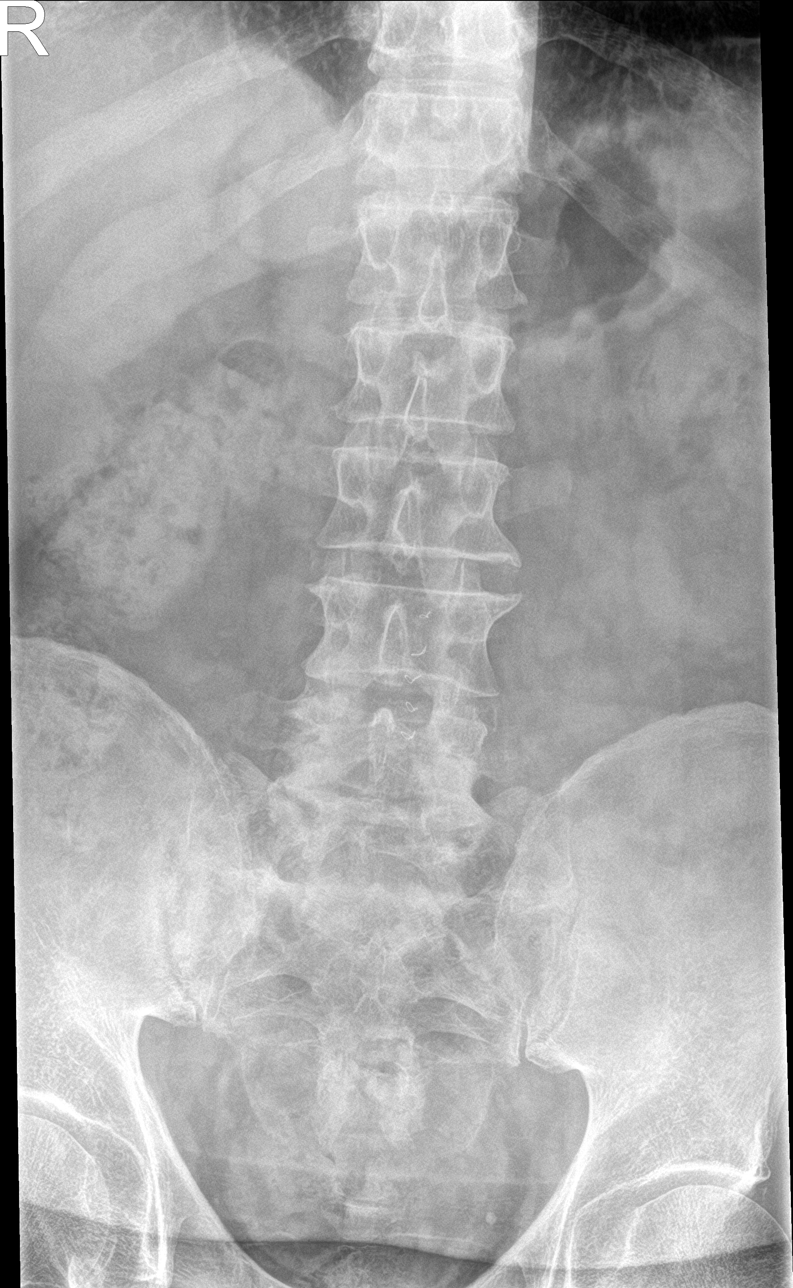

[l-spine obl (1 of 2)]
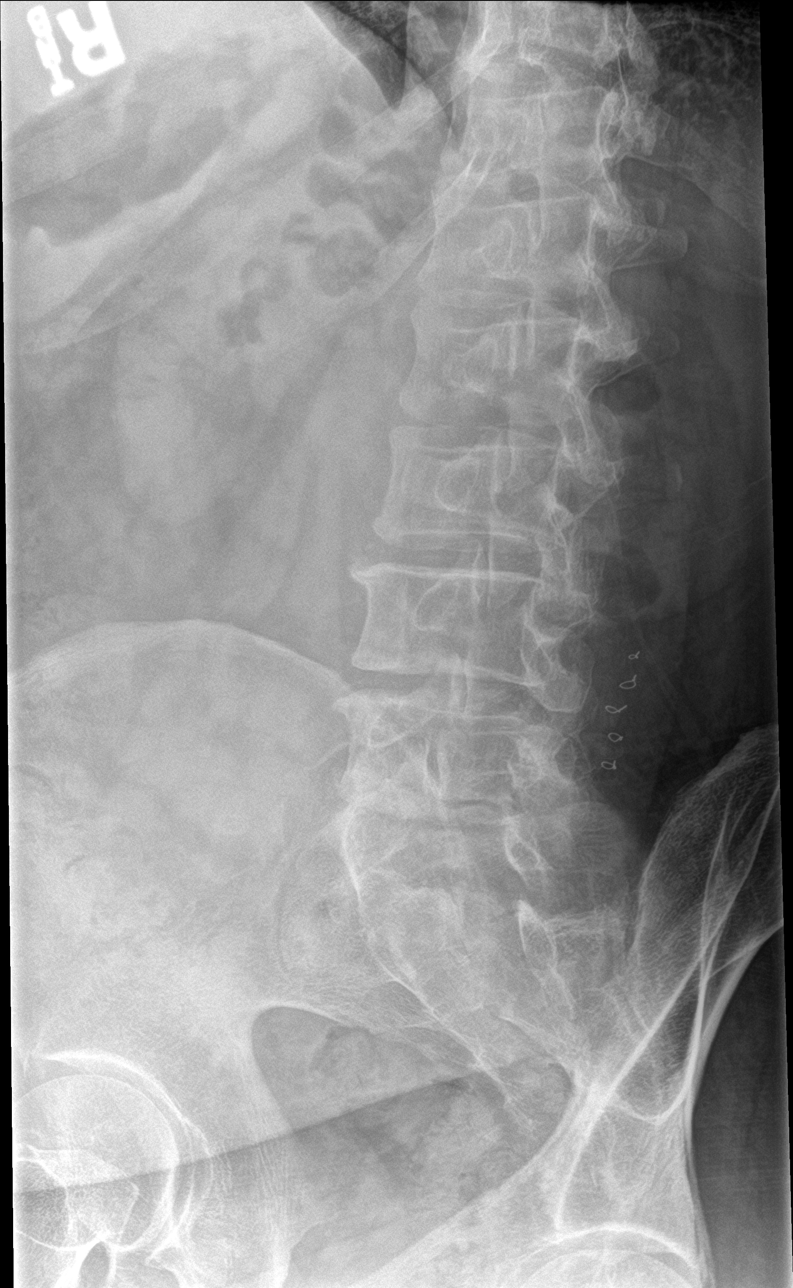

[l-spine obl (2 of 2)]
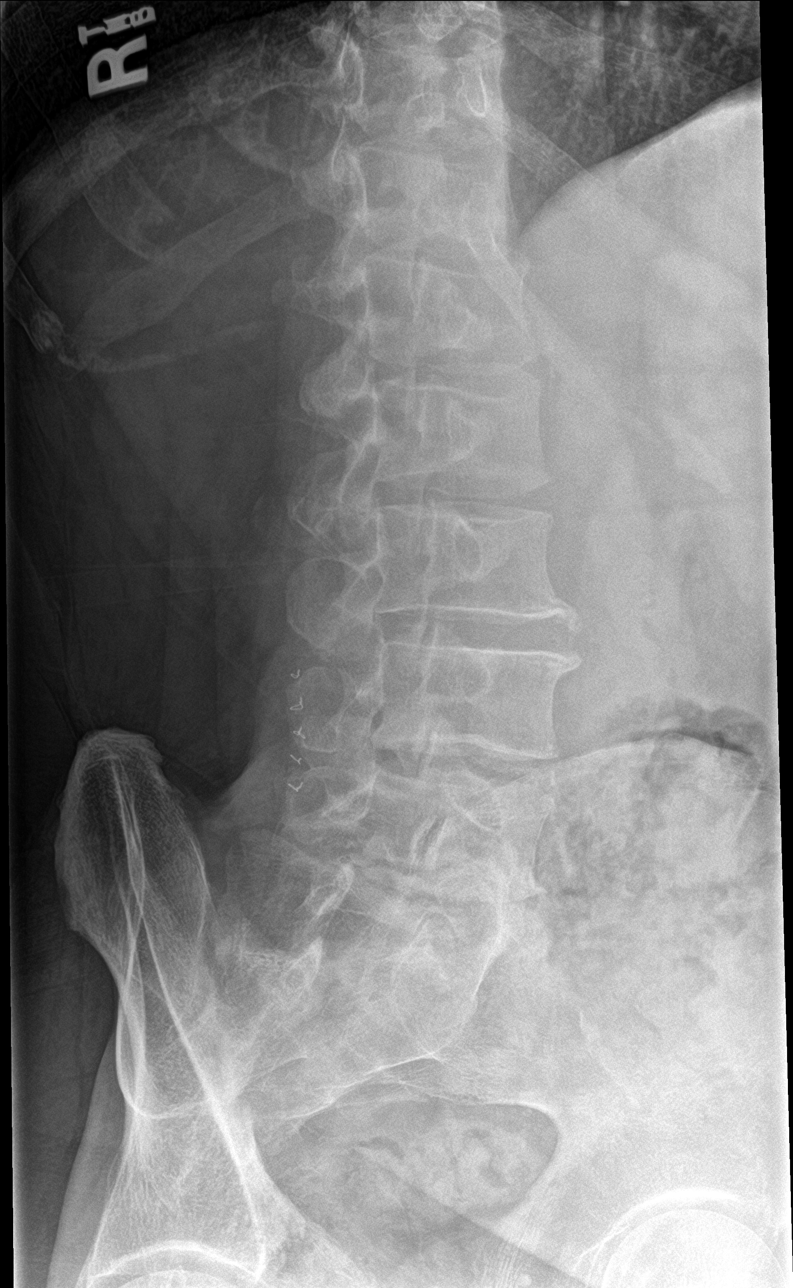

[l-spine lat]
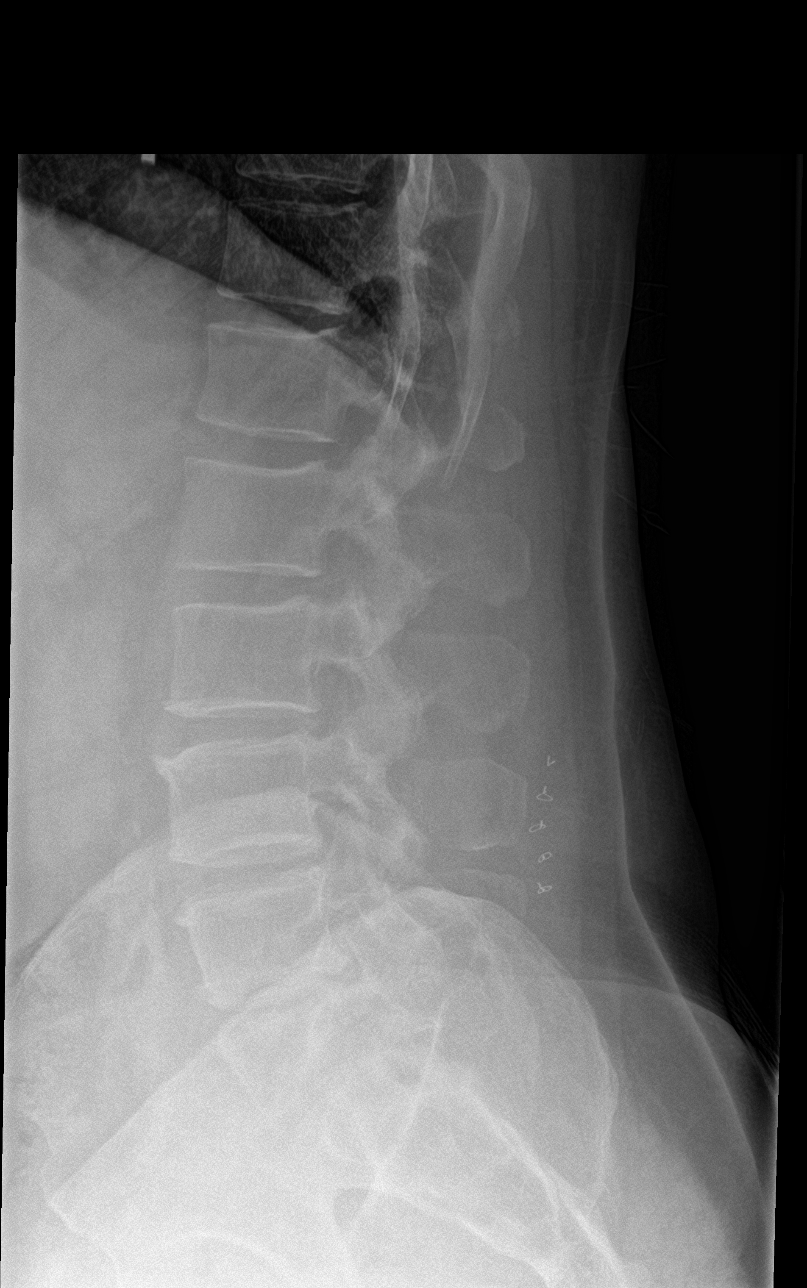

[l-spine spot]
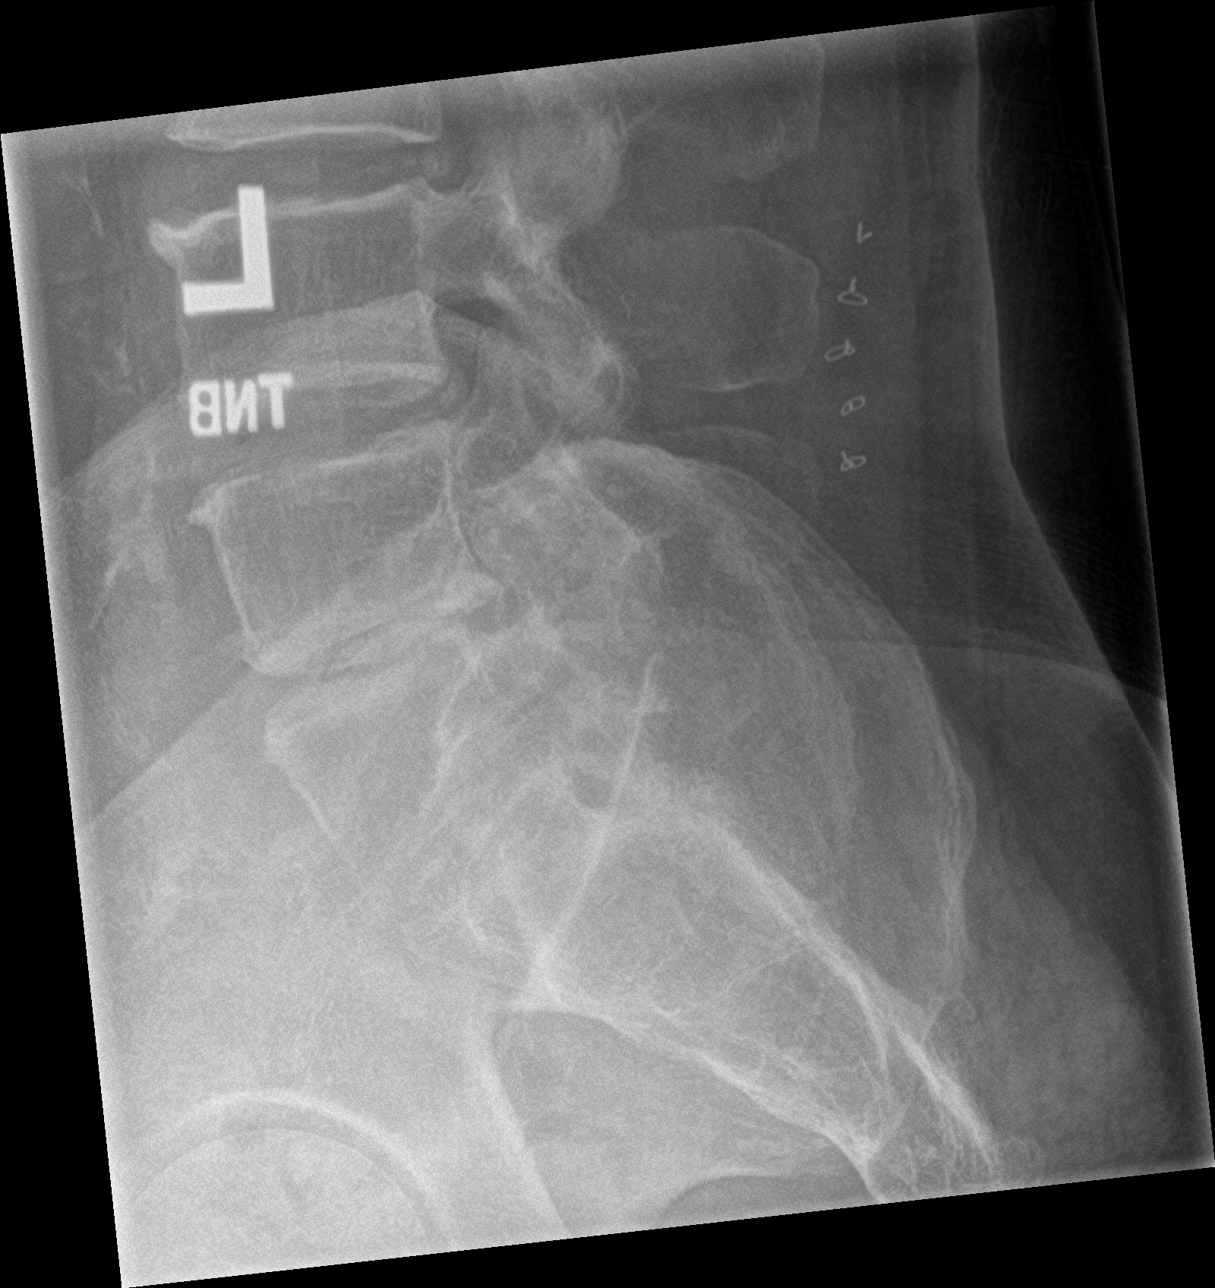

[5 of 5 positions shown; findings below may reference images not displayed]

FINDINGS: Frontal, lateral, spot lumbosacral lateral, and bilateral oblique
views were obtained. There are 5 non-rib-bearing lumbar type
vertebral bodies. There is no appreciable fracture or
spondylolisthesis. There is moderate disc space narrowing at L5-S1
with slightly milder disc space narrowing at L3-4 and L4-5. There is
facet osteoarthritic change at L4-5 and L5-S1 bilaterally. There is
postoperative change posterior to the lower lumbar region. There are
foci of aortic atherosclerosis.
IMPRESSION: Disc space narrowing at L5-S1 as well as to a somewhat lesser extent
at L3-4 and L4-5. Facet osteoarthritic change noted at L4-5 and
L5-S1 bilaterally. No fracture or spondylolisthesis.

Aortic Atherosclerosis ([id number]-055.5).

## 2020-12-09 DIAGNOSIS — M961 Postlaminectomy syndrome, not elsewhere classified: Secondary | ICD-10-CM | POA: Diagnosis not present

## 2020-12-09 DIAGNOSIS — I10 Essential (primary) hypertension: Secondary | ICD-10-CM | POA: Diagnosis not present

## 2020-12-09 DIAGNOSIS — M48062 Spinal stenosis, lumbar region with neurogenic claudication: Secondary | ICD-10-CM | POA: Diagnosis not present

## 2020-12-16 DIAGNOSIS — M961 Postlaminectomy syndrome, not elsewhere classified: Secondary | ICD-10-CM | POA: Diagnosis not present

## 2020-12-16 DIAGNOSIS — F17210 Nicotine dependence, cigarettes, uncomplicated: Secondary | ICD-10-CM | POA: Diagnosis not present

## 2020-12-16 DIAGNOSIS — Z79899 Other long term (current) drug therapy: Secondary | ICD-10-CM | POA: Diagnosis not present

## 2020-12-16 DIAGNOSIS — R030 Elevated blood-pressure reading, without diagnosis of hypertension: Secondary | ICD-10-CM | POA: Diagnosis not present

## 2020-12-16 DIAGNOSIS — F17200 Nicotine dependence, unspecified, uncomplicated: Secondary | ICD-10-CM | POA: Diagnosis not present

## 2020-12-16 DIAGNOSIS — M129 Arthropathy, unspecified: Secondary | ICD-10-CM | POA: Diagnosis not present

## 2020-12-16 DIAGNOSIS — E559 Vitamin D deficiency, unspecified: Secondary | ICD-10-CM | POA: Diagnosis not present

## 2020-12-16 DIAGNOSIS — G894 Chronic pain syndrome: Secondary | ICD-10-CM | POA: Diagnosis not present

## 2021-01-03 DIAGNOSIS — M5416 Radiculopathy, lumbar region: Secondary | ICD-10-CM | POA: Diagnosis not present

## 2021-01-04 DIAGNOSIS — G894 Chronic pain syndrome: Secondary | ICD-10-CM | POA: Diagnosis not present

## 2021-01-04 DIAGNOSIS — R030 Elevated blood-pressure reading, without diagnosis of hypertension: Secondary | ICD-10-CM | POA: Diagnosis not present

## 2021-01-04 DIAGNOSIS — M961 Postlaminectomy syndrome, not elsewhere classified: Secondary | ICD-10-CM | POA: Diagnosis not present

## 2021-01-04 DIAGNOSIS — Z79899 Other long term (current) drug therapy: Secondary | ICD-10-CM | POA: Diagnosis not present

## 2021-01-04 DIAGNOSIS — F17210 Nicotine dependence, cigarettes, uncomplicated: Secondary | ICD-10-CM | POA: Diagnosis not present

## 2021-01-04 DIAGNOSIS — F17200 Nicotine dependence, unspecified, uncomplicated: Secondary | ICD-10-CM | POA: Diagnosis not present

## 2021-01-09 DIAGNOSIS — M5459 Other low back pain: Secondary | ICD-10-CM | POA: Diagnosis not present

## 2021-01-09 DIAGNOSIS — M5416 Radiculopathy, lumbar region: Secondary | ICD-10-CM | POA: Diagnosis not present

## 2021-01-11 DIAGNOSIS — M5459 Other low back pain: Secondary | ICD-10-CM | POA: Diagnosis not present

## 2021-01-11 DIAGNOSIS — M5416 Radiculopathy, lumbar region: Secondary | ICD-10-CM | POA: Diagnosis not present

## 2021-01-17 DIAGNOSIS — M5459 Other low back pain: Secondary | ICD-10-CM | POA: Diagnosis not present

## 2021-01-17 DIAGNOSIS — M5416 Radiculopathy, lumbar region: Secondary | ICD-10-CM | POA: Diagnosis not present

## 2021-01-19 DIAGNOSIS — M5416 Radiculopathy, lumbar region: Secondary | ICD-10-CM | POA: Diagnosis not present

## 2021-01-19 DIAGNOSIS — M5459 Other low back pain: Secondary | ICD-10-CM | POA: Diagnosis not present

## 2021-01-24 DIAGNOSIS — M5416 Radiculopathy, lumbar region: Secondary | ICD-10-CM | POA: Diagnosis not present

## 2021-01-24 DIAGNOSIS — M4326 Fusion of spine, lumbar region: Secondary | ICD-10-CM | POA: Diagnosis not present

## 2021-01-24 DIAGNOSIS — M5459 Other low back pain: Secondary | ICD-10-CM | POA: Diagnosis not present

## 2021-01-24 DIAGNOSIS — M961 Postlaminectomy syndrome, not elsewhere classified: Secondary | ICD-10-CM | POA: Diagnosis not present

## 2021-01-24 DIAGNOSIS — M4726 Other spondylosis with radiculopathy, lumbar region: Secondary | ICD-10-CM | POA: Diagnosis not present

## 2021-01-26 DIAGNOSIS — F17210 Nicotine dependence, cigarettes, uncomplicated: Secondary | ICD-10-CM | POA: Diagnosis not present

## 2021-01-26 DIAGNOSIS — M961 Postlaminectomy syndrome, not elsewhere classified: Secondary | ICD-10-CM | POA: Diagnosis not present

## 2021-01-26 DIAGNOSIS — F17200 Nicotine dependence, unspecified, uncomplicated: Secondary | ICD-10-CM | POA: Diagnosis not present

## 2021-01-26 DIAGNOSIS — R7989 Other specified abnormal findings of blood chemistry: Secondary | ICD-10-CM | POA: Diagnosis not present

## 2021-01-26 DIAGNOSIS — R030 Elevated blood-pressure reading, without diagnosis of hypertension: Secondary | ICD-10-CM | POA: Diagnosis not present

## 2021-01-26 DIAGNOSIS — G894 Chronic pain syndrome: Secondary | ICD-10-CM | POA: Diagnosis not present

## 2021-02-02 DIAGNOSIS — M5416 Radiculopathy, lumbar region: Secondary | ICD-10-CM | POA: Diagnosis not present

## 2021-02-02 DIAGNOSIS — M5459 Other low back pain: Secondary | ICD-10-CM | POA: Diagnosis not present

## 2021-02-03 DIAGNOSIS — I714 Abdominal aortic aneurysm, without rupture: Secondary | ICD-10-CM | POA: Diagnosis not present

## 2021-02-03 DIAGNOSIS — I25119 Atherosclerotic heart disease of native coronary artery with unspecified angina pectoris: Secondary | ICD-10-CM | POA: Diagnosis not present

## 2021-02-03 DIAGNOSIS — E538 Deficiency of other specified B group vitamins: Secondary | ICD-10-CM | POA: Diagnosis not present

## 2021-02-03 DIAGNOSIS — G8929 Other chronic pain: Secondary | ICD-10-CM | POA: Diagnosis not present

## 2021-02-03 DIAGNOSIS — I739 Peripheral vascular disease, unspecified: Secondary | ICD-10-CM | POA: Diagnosis not present

## 2021-02-07 DIAGNOSIS — M5459 Other low back pain: Secondary | ICD-10-CM | POA: Diagnosis not present

## 2021-02-07 DIAGNOSIS — M5416 Radiculopathy, lumbar region: Secondary | ICD-10-CM | POA: Diagnosis not present

## 2021-02-08 DIAGNOSIS — G894 Chronic pain syndrome: Secondary | ICD-10-CM | POA: Diagnosis not present

## 2021-02-08 DIAGNOSIS — M961 Postlaminectomy syndrome, not elsewhere classified: Secondary | ICD-10-CM | POA: Diagnosis not present

## 2021-02-08 DIAGNOSIS — R7989 Other specified abnormal findings of blood chemistry: Secondary | ICD-10-CM | POA: Diagnosis not present

## 2021-02-08 DIAGNOSIS — R030 Elevated blood-pressure reading, without diagnosis of hypertension: Secondary | ICD-10-CM | POA: Diagnosis not present

## 2021-02-08 DIAGNOSIS — F17200 Nicotine dependence, unspecified, uncomplicated: Secondary | ICD-10-CM | POA: Diagnosis not present

## 2021-02-08 DIAGNOSIS — F17210 Nicotine dependence, cigarettes, uncomplicated: Secondary | ICD-10-CM | POA: Diagnosis not present

## 2021-02-09 DIAGNOSIS — M5459 Other low back pain: Secondary | ICD-10-CM | POA: Diagnosis not present

## 2021-02-09 DIAGNOSIS — M5416 Radiculopathy, lumbar region: Secondary | ICD-10-CM | POA: Diagnosis not present

## 2021-02-14 DIAGNOSIS — M5416 Radiculopathy, lumbar region: Secondary | ICD-10-CM | POA: Diagnosis not present

## 2021-02-14 DIAGNOSIS — M5459 Other low back pain: Secondary | ICD-10-CM | POA: Diagnosis not present

## 2021-02-21 DIAGNOSIS — M5416 Radiculopathy, lumbar region: Secondary | ICD-10-CM | POA: Diagnosis not present

## 2021-02-21 DIAGNOSIS — M5459 Other low back pain: Secondary | ICD-10-CM | POA: Diagnosis not present

## 2021-02-22 DIAGNOSIS — F17210 Nicotine dependence, cigarettes, uncomplicated: Secondary | ICD-10-CM | POA: Diagnosis not present

## 2021-02-22 DIAGNOSIS — G894 Chronic pain syndrome: Secondary | ICD-10-CM | POA: Diagnosis not present

## 2021-02-22 DIAGNOSIS — M961 Postlaminectomy syndrome, not elsewhere classified: Secondary | ICD-10-CM | POA: Diagnosis not present

## 2021-02-22 DIAGNOSIS — R7989 Other specified abnormal findings of blood chemistry: Secondary | ICD-10-CM | POA: Diagnosis not present

## 2021-02-22 DIAGNOSIS — Z79899 Other long term (current) drug therapy: Secondary | ICD-10-CM | POA: Diagnosis not present

## 2021-02-22 DIAGNOSIS — F17200 Nicotine dependence, unspecified, uncomplicated: Secondary | ICD-10-CM | POA: Diagnosis not present

## 2021-02-22 DIAGNOSIS — R030 Elevated blood-pressure reading, without diagnosis of hypertension: Secondary | ICD-10-CM | POA: Diagnosis not present

## 2021-02-23 ENCOUNTER — Other Ambulatory Visit (HOSPITAL_BASED_OUTPATIENT_CLINIC_OR_DEPARTMENT_OTHER): Payer: Self-pay | Admitting: Nurse Practitioner

## 2021-02-23 ENCOUNTER — Other Ambulatory Visit (HOSPITAL_COMMUNITY): Payer: Self-pay | Admitting: Nurse Practitioner

## 2021-02-23 DIAGNOSIS — M5416 Radiculopathy, lumbar region: Secondary | ICD-10-CM | POA: Diagnosis not present

## 2021-02-23 DIAGNOSIS — M961 Postlaminectomy syndrome, not elsewhere classified: Secondary | ICD-10-CM

## 2021-02-23 DIAGNOSIS — M5459 Other low back pain: Secondary | ICD-10-CM | POA: Diagnosis not present

## 2021-02-27 DIAGNOSIS — Z79899 Other long term (current) drug therapy: Secondary | ICD-10-CM | POA: Diagnosis not present

## 2021-03-01 DIAGNOSIS — M5416 Radiculopathy, lumbar region: Secondary | ICD-10-CM | POA: Diagnosis not present

## 2021-03-01 DIAGNOSIS — M5459 Other low back pain: Secondary | ICD-10-CM | POA: Diagnosis not present

## 2021-03-06 DIAGNOSIS — M5416 Radiculopathy, lumbar region: Secondary | ICD-10-CM | POA: Diagnosis not present

## 2021-03-06 DIAGNOSIS — M5459 Other low back pain: Secondary | ICD-10-CM | POA: Diagnosis not present

## 2021-03-10 DIAGNOSIS — M4326 Fusion of spine, lumbar region: Secondary | ICD-10-CM | POA: Diagnosis not present

## 2021-03-10 DIAGNOSIS — M4726 Other spondylosis with radiculopathy, lumbar region: Secondary | ICD-10-CM | POA: Diagnosis not present

## 2021-03-10 DIAGNOSIS — M961 Postlaminectomy syndrome, not elsewhere classified: Secondary | ICD-10-CM | POA: Diagnosis not present

## 2021-03-20 ENCOUNTER — Other Ambulatory Visit: Payer: Self-pay | Admitting: Orthopedic Surgery

## 2021-03-20 DIAGNOSIS — M4326 Fusion of spine, lumbar region: Secondary | ICD-10-CM

## 2021-03-21 DIAGNOSIS — Z79899 Other long term (current) drug therapy: Secondary | ICD-10-CM | POA: Diagnosis not present

## 2021-03-21 DIAGNOSIS — R7989 Other specified abnormal findings of blood chemistry: Secondary | ICD-10-CM | POA: Diagnosis not present

## 2021-03-21 DIAGNOSIS — F17200 Nicotine dependence, unspecified, uncomplicated: Secondary | ICD-10-CM | POA: Diagnosis not present

## 2021-03-21 DIAGNOSIS — M961 Postlaminectomy syndrome, not elsewhere classified: Secondary | ICD-10-CM | POA: Diagnosis not present

## 2021-03-21 DIAGNOSIS — F17210 Nicotine dependence, cigarettes, uncomplicated: Secondary | ICD-10-CM | POA: Diagnosis not present

## 2021-03-21 DIAGNOSIS — R030 Elevated blood-pressure reading, without diagnosis of hypertension: Secondary | ICD-10-CM | POA: Diagnosis not present

## 2021-03-21 DIAGNOSIS — G894 Chronic pain syndrome: Secondary | ICD-10-CM | POA: Diagnosis not present

## 2021-03-27 DIAGNOSIS — Z79899 Other long term (current) drug therapy: Secondary | ICD-10-CM | POA: Diagnosis not present

## 2021-03-28 ENCOUNTER — Other Ambulatory Visit: Payer: Self-pay

## 2021-03-28 ENCOUNTER — Ambulatory Visit (HOSPITAL_COMMUNITY)
Admission: RE | Admit: 2021-03-28 | Discharge: 2021-03-28 | Disposition: A | Discharge status: Home / Self Care | Payer: Medicare Other | Source: Ambulatory Visit | Attending: Nurse Practitioner | Admitting: Nurse Practitioner

## 2021-03-28 DIAGNOSIS — M47816 Spondylosis without myelopathy or radiculopathy, lumbar region: Secondary | ICD-10-CM | POA: Diagnosis not present

## 2021-03-28 DIAGNOSIS — M961 Postlaminectomy syndrome, not elsewhere classified: Secondary | ICD-10-CM | POA: Diagnosis not present

## 2021-03-28 DIAGNOSIS — M5126 Other intervertebral disc displacement, lumbar region: Secondary | ICD-10-CM | POA: Diagnosis not present

## 2021-03-28 DIAGNOSIS — M48061 Spinal stenosis, lumbar region without neurogenic claudication: Secondary | ICD-10-CM | POA: Diagnosis not present

## 2021-03-28 MED ORDER — GADOBUTROL 1 MMOL/ML IV SOLN
10.0000 mL | Freq: Once | INTRAVENOUS | Status: AC | PRN
  Administered 2021-03-28: 10 mL via INTRAVENOUS

## 2021-03-30 ENCOUNTER — Ambulatory Visit
Admission: RE | Admit: 2021-03-30 | Discharge: 2021-03-30 | Disposition: A | Discharge status: Home / Self Care | Payer: Medicare Other | Source: Ambulatory Visit | Attending: Orthopedic Surgery | Admitting: Orthopedic Surgery

## 2021-03-30 ENCOUNTER — Other Ambulatory Visit: Payer: Self-pay

## 2021-03-30 DIAGNOSIS — M48061 Spinal stenosis, lumbar region without neurogenic claudication: Secondary | ICD-10-CM | POA: Diagnosis not present

## 2021-03-30 DIAGNOSIS — M4326 Fusion of spine, lumbar region: Secondary | ICD-10-CM | POA: Diagnosis not present

## 2021-03-30 DIAGNOSIS — M4316 Spondylolisthesis, lumbar region: Secondary | ICD-10-CM | POA: Diagnosis not present

## 2021-03-30 MED ORDER — ONDANSETRON HCL 4 MG/2ML IJ SOLN
4.0000 mg | Freq: Once | INTRAMUSCULAR | Status: AC | PRN
  Administered 2021-03-30: 4 mg via INTRAMUSCULAR

## 2021-03-30 MED ORDER — DIAZEPAM 5 MG PO TABS
10.0000 mg | ORAL_TABLET | Freq: Once | ORAL | Status: AC
  Administered 2021-03-30: 10 mg via ORAL

## 2021-03-30 MED ORDER — MEPERIDINE HCL 50 MG/ML IJ SOLN
50.0000 mg | Freq: Once | INTRAMUSCULAR | Status: AC | PRN
  Administered 2021-03-30: 50 mg via INTRAMUSCULAR

## 2021-03-30 MED ORDER — IOPAMIDOL (ISOVUE-M 200) INJECTION 41%
20.0000 mL | Freq: Once | INTRAMUSCULAR | Status: AC
  Administered 2021-03-30: 20 mL via INTRATHECAL

## 2021-03-30 NOTE — Discharge Instr - Other Orders (Addendum)
1105: Pt reports 10/10 pain post myelogram procedure, see MAR.  1128: Relief reported

## 2021-03-30 NOTE — Discharge Instructions (Signed)
Myelogram Discharge Instructions  Go home and rest quietly as needed. You may resume normal activities; however, do not exert yourself strongly or do any heavy lifting today and tomorrow.   DO NOT drive today.    You may resume your normal diet and medications unless otherwise indicated. Drink a lot of extra fluids today and tomorrow.   The incidence of a spinal headache is about 5% (one in 20 patients).  If you develop a headache when you are sitting up or standing that gets better when you lie down, please lie flat for 24 hours and drink plenty of fluids until the headache goes away.  Caffeinated beverages may be helpful.   If you develop severe nausea and vomiting or a headache that does not go away with the flat bedrest after 48 hours, please call [phone].   Call your physician for a follow-up appointment.  The results of your myelogram will be sent directly to your physician by the following day.  Please call us at [phone] if you have any questions or if complications develop after you arrive home.   Discharge instructions have been explained to the patient.  The patient, or the person responsible for the patient, state they fully understands these instructions.   Thank you for visiting our office today.    

## 2021-04-07 DIAGNOSIS — M4326 Fusion of spine, lumbar region: Secondary | ICD-10-CM | POA: Diagnosis not present

## 2021-04-13 DIAGNOSIS — I714 Abdominal aortic aneurysm, without rupture, unspecified: Secondary | ICD-10-CM | POA: Diagnosis not present

## 2021-04-13 DIAGNOSIS — F17200 Nicotine dependence, unspecified, uncomplicated: Secondary | ICD-10-CM | POA: Diagnosis not present

## 2021-04-13 DIAGNOSIS — R7989 Other specified abnormal findings of blood chemistry: Secondary | ICD-10-CM | POA: Diagnosis not present

## 2021-04-13 DIAGNOSIS — G894 Chronic pain syndrome: Secondary | ICD-10-CM | POA: Diagnosis not present

## 2021-04-13 DIAGNOSIS — Z79899 Other long term (current) drug therapy: Secondary | ICD-10-CM | POA: Diagnosis not present

## 2021-04-13 DIAGNOSIS — M961 Postlaminectomy syndrome, not elsewhere classified: Secondary | ICD-10-CM | POA: Diagnosis not present

## 2021-04-13 DIAGNOSIS — F17210 Nicotine dependence, cigarettes, uncomplicated: Secondary | ICD-10-CM | POA: Diagnosis not present

## 2021-04-13 DIAGNOSIS — R030 Elevated blood-pressure reading, without diagnosis of hypertension: Secondary | ICD-10-CM | POA: Diagnosis not present

## 2021-04-13 DIAGNOSIS — M25552 Pain in left hip: Secondary | ICD-10-CM | POA: Diagnosis not present

## 2021-04-16 DIAGNOSIS — Z79899 Other long term (current) drug therapy: Secondary | ICD-10-CM | POA: Diagnosis not present

## 2021-05-16 DIAGNOSIS — F17200 Nicotine dependence, unspecified, uncomplicated: Secondary | ICD-10-CM | POA: Diagnosis not present

## 2021-05-16 DIAGNOSIS — R7989 Other specified abnormal findings of blood chemistry: Secondary | ICD-10-CM | POA: Diagnosis not present

## 2021-05-16 DIAGNOSIS — R030 Elevated blood-pressure reading, without diagnosis of hypertension: Secondary | ICD-10-CM | POA: Diagnosis not present

## 2021-05-16 DIAGNOSIS — Z0000 Encounter for general adult medical examination without abnormal findings: Secondary | ICD-10-CM | POA: Diagnosis not present

## 2021-05-16 DIAGNOSIS — G894 Chronic pain syndrome: Secondary | ICD-10-CM | POA: Diagnosis not present

## 2021-05-16 DIAGNOSIS — F17210 Nicotine dependence, cigarettes, uncomplicated: Secondary | ICD-10-CM | POA: Diagnosis not present

## 2021-05-16 DIAGNOSIS — M961 Postlaminectomy syndrome, not elsewhere classified: Secondary | ICD-10-CM | POA: Diagnosis not present

## 2021-05-16 DIAGNOSIS — Z79899 Other long term (current) drug therapy: Secondary | ICD-10-CM | POA: Diagnosis not present

## 2021-05-22 DIAGNOSIS — Z79899 Other long term (current) drug therapy: Secondary | ICD-10-CM | POA: Diagnosis not present

## 2021-06-15 DIAGNOSIS — F17210 Nicotine dependence, cigarettes, uncomplicated: Secondary | ICD-10-CM | POA: Diagnosis not present

## 2021-06-15 DIAGNOSIS — G894 Chronic pain syndrome: Secondary | ICD-10-CM | POA: Diagnosis not present

## 2021-06-15 DIAGNOSIS — M961 Postlaminectomy syndrome, not elsewhere classified: Secondary | ICD-10-CM | POA: Diagnosis not present

## 2021-06-15 DIAGNOSIS — R7989 Other specified abnormal findings of blood chemistry: Secondary | ICD-10-CM | POA: Diagnosis not present

## 2021-06-15 DIAGNOSIS — F17200 Nicotine dependence, unspecified, uncomplicated: Secondary | ICD-10-CM | POA: Diagnosis not present

## 2021-06-15 DIAGNOSIS — R030 Elevated blood-pressure reading, without diagnosis of hypertension: Secondary | ICD-10-CM | POA: Diagnosis not present

## 2021-06-15 DIAGNOSIS — Z79899 Other long term (current) drug therapy: Secondary | ICD-10-CM | POA: Diagnosis not present

## 2021-06-20 DIAGNOSIS — Z79899 Other long term (current) drug therapy: Secondary | ICD-10-CM | POA: Diagnosis not present

## 2021-07-04 DIAGNOSIS — J069 Acute upper respiratory infection, unspecified: Secondary | ICD-10-CM | POA: Diagnosis not present

## 2021-07-17 DIAGNOSIS — G894 Chronic pain syndrome: Secondary | ICD-10-CM | POA: Diagnosis not present

## 2021-07-17 DIAGNOSIS — R7989 Other specified abnormal findings of blood chemistry: Secondary | ICD-10-CM | POA: Diagnosis not present

## 2021-07-17 DIAGNOSIS — M961 Postlaminectomy syndrome, not elsewhere classified: Secondary | ICD-10-CM | POA: Diagnosis not present

## 2021-07-17 DIAGNOSIS — F17210 Nicotine dependence, cigarettes, uncomplicated: Secondary | ICD-10-CM | POA: Diagnosis not present

## 2021-07-17 DIAGNOSIS — Z79899 Other long term (current) drug therapy: Secondary | ICD-10-CM | POA: Diagnosis not present

## 2021-07-20 DIAGNOSIS — Z79899 Other long term (current) drug therapy: Secondary | ICD-10-CM | POA: Diagnosis not present

## 2021-08-16 DIAGNOSIS — M961 Postlaminectomy syndrome, not elsewhere classified: Secondary | ICD-10-CM | POA: Diagnosis not present

## 2021-08-16 DIAGNOSIS — Z0000 Encounter for general adult medical examination without abnormal findings: Secondary | ICD-10-CM | POA: Diagnosis not present

## 2021-08-16 DIAGNOSIS — F17210 Nicotine dependence, cigarettes, uncomplicated: Secondary | ICD-10-CM | POA: Diagnosis not present

## 2021-08-16 DIAGNOSIS — F17200 Nicotine dependence, unspecified, uncomplicated: Secondary | ICD-10-CM | POA: Diagnosis not present

## 2021-08-16 DIAGNOSIS — Z6835 Body mass index (BMI) 35.0-35.9, adult: Secondary | ICD-10-CM | POA: Diagnosis not present

## 2021-08-16 DIAGNOSIS — G894 Chronic pain syndrome: Secondary | ICD-10-CM | POA: Diagnosis not present

## 2021-08-16 DIAGNOSIS — R7989 Other specified abnormal findings of blood chemistry: Secondary | ICD-10-CM | POA: Diagnosis not present

## 2021-08-16 DIAGNOSIS — Z79899 Other long term (current) drug therapy: Secondary | ICD-10-CM | POA: Diagnosis not present

## 2021-08-18 DIAGNOSIS — Z79899 Other long term (current) drug therapy: Secondary | ICD-10-CM | POA: Diagnosis not present

## 2021-08-21 DIAGNOSIS — M1990 Unspecified osteoarthritis, unspecified site: Secondary | ICD-10-CM | POA: Diagnosis not present

## 2021-08-21 DIAGNOSIS — R7303 Prediabetes: Secondary | ICD-10-CM | POA: Diagnosis not present

## 2021-08-21 DIAGNOSIS — E538 Deficiency of other specified B group vitamins: Secondary | ICD-10-CM | POA: Diagnosis not present

## 2021-08-21 DIAGNOSIS — E559 Vitamin D deficiency, unspecified: Secondary | ICD-10-CM | POA: Diagnosis not present

## 2021-08-21 DIAGNOSIS — I10 Essential (primary) hypertension: Secondary | ICD-10-CM | POA: Diagnosis not present

## 2021-08-23 DIAGNOSIS — I10 Essential (primary) hypertension: Secondary | ICD-10-CM | POA: Diagnosis not present

## 2021-08-23 DIAGNOSIS — I25119 Atherosclerotic heart disease of native coronary artery with unspecified angina pectoris: Secondary | ICD-10-CM | POA: Diagnosis not present

## 2021-08-23 DIAGNOSIS — M25471 Effusion, right ankle: Secondary | ICD-10-CM | POA: Diagnosis not present

## 2021-08-23 DIAGNOSIS — I739 Peripheral vascular disease, unspecified: Secondary | ICD-10-CM | POA: Diagnosis not present

## 2021-08-23 DIAGNOSIS — G8929 Other chronic pain: Secondary | ICD-10-CM | POA: Diagnosis not present

## 2021-08-23 DIAGNOSIS — E538 Deficiency of other specified B group vitamins: Secondary | ICD-10-CM | POA: Diagnosis not present

## 2021-08-23 DIAGNOSIS — K439 Ventral hernia without obstruction or gangrene: Secondary | ICD-10-CM | POA: Diagnosis not present

## 2021-08-23 DIAGNOSIS — I714 Abdominal aortic aneurysm, without rupture, unspecified: Secondary | ICD-10-CM | POA: Diagnosis not present

## 2021-08-23 DIAGNOSIS — R065 Mouth breathing: Secondary | ICD-10-CM | POA: Diagnosis not present

## 2021-08-24 ENCOUNTER — Other Ambulatory Visit (HOSPITAL_COMMUNITY): Payer: Self-pay | Admitting: Family Medicine

## 2021-08-24 ENCOUNTER — Other Ambulatory Visit: Payer: Self-pay | Admitting: Family Medicine

## 2021-08-24 DIAGNOSIS — I714 Abdominal aortic aneurysm, without rupture, unspecified: Secondary | ICD-10-CM

## 2021-08-30 ENCOUNTER — Other Ambulatory Visit (HOSPITAL_COMMUNITY): Payer: Self-pay | Admitting: Family Medicine

## 2021-08-30 ENCOUNTER — Other Ambulatory Visit: Payer: Self-pay

## 2021-08-30 ENCOUNTER — Other Ambulatory Visit: Payer: Self-pay | Admitting: Family Medicine

## 2021-08-30 ENCOUNTER — Ambulatory Visit (HOSPITAL_COMMUNITY)
Admission: RE | Admit: 2021-08-30 | Discharge: 2021-08-30 | Disposition: A | Discharge status: Home / Self Care | Payer: Medicare PPO | Source: Ambulatory Visit | Attending: Family Medicine | Admitting: Family Medicine

## 2021-08-30 DIAGNOSIS — M25471 Effusion, right ankle: Secondary | ICD-10-CM | POA: Insufficient documentation

## 2021-08-30 DIAGNOSIS — M7989 Other specified soft tissue disorders: Secondary | ICD-10-CM | POA: Diagnosis not present

## 2021-08-30 DIAGNOSIS — R600 Localized edema: Secondary | ICD-10-CM | POA: Diagnosis not present

## 2021-09-13 DIAGNOSIS — R7989 Other specified abnormal findings of blood chemistry: Secondary | ICD-10-CM | POA: Diagnosis not present

## 2021-09-13 DIAGNOSIS — F17210 Nicotine dependence, cigarettes, uncomplicated: Secondary | ICD-10-CM | POA: Diagnosis not present

## 2021-09-13 DIAGNOSIS — R030 Elevated blood-pressure reading, without diagnosis of hypertension: Secondary | ICD-10-CM | POA: Diagnosis not present

## 2021-09-13 DIAGNOSIS — Z79899 Other long term (current) drug therapy: Secondary | ICD-10-CM | POA: Diagnosis not present

## 2021-09-13 DIAGNOSIS — G894 Chronic pain syndrome: Secondary | ICD-10-CM | POA: Diagnosis not present

## 2021-09-13 DIAGNOSIS — F17200 Nicotine dependence, unspecified, uncomplicated: Secondary | ICD-10-CM | POA: Diagnosis not present

## 2021-09-13 DIAGNOSIS — M961 Postlaminectomy syndrome, not elsewhere classified: Secondary | ICD-10-CM | POA: Diagnosis not present

## 2021-09-13 DIAGNOSIS — Z6835 Body mass index (BMI) 35.0-35.9, adult: Secondary | ICD-10-CM | POA: Diagnosis not present

## 2021-09-21 ENCOUNTER — Other Ambulatory Visit: Payer: Self-pay

## 2021-09-21 ENCOUNTER — Ambulatory Visit (HOSPITAL_COMMUNITY)
Admission: RE | Admit: 2021-09-21 | Discharge: 2021-09-21 | Disposition: A | Discharge status: Home / Self Care | Payer: Medicare PPO | Source: Ambulatory Visit | Attending: Family Medicine | Admitting: Family Medicine

## 2021-09-21 DIAGNOSIS — I7143 Infrarenal abdominal aortic aneurysm, without rupture: Secondary | ICD-10-CM | POA: Diagnosis not present

## 2021-09-21 DIAGNOSIS — I714 Abdominal aortic aneurysm, without rupture, unspecified: Secondary | ICD-10-CM | POA: Insufficient documentation

## 2021-09-21 LAB — POCT I-STAT CREATININE: Creatinine, Ser: 1 mg/dL (ref 0.61–1.24)

## 2021-09-21 MED ORDER — IOHEXOL 350 MG/ML SOLN
100.0000 mL | Freq: Once | INTRAVENOUS | Status: AC | PRN
Start: 2021-09-21 — End: 2021-09-21
  Administered 2021-09-21: 100 mL via INTRAVENOUS

## 2021-09-27 ENCOUNTER — Encounter: Payer: Self-pay | Admitting: Vascular Surgery

## 2021-09-27 ENCOUNTER — Ambulatory Visit: Payer: Medicare PPO | Admitting: Vascular Surgery

## 2021-09-27 VITALS — BP 121/77 | HR 75 | Ht 69.0 in | Wt 242.2 lb

## 2021-09-27 DIAGNOSIS — I739 Peripheral vascular disease, unspecified: Secondary | ICD-10-CM

## 2021-09-27 DIAGNOSIS — I7143 Infrarenal abdominal aortic aneurysm, without rupture: Secondary | ICD-10-CM

## 2021-09-27 NOTE — Progress Notes (Signed)
? ? [facility] [place] ? ?Patient name: Anthony Todd. MRN: [id number] DOB: 1961-01-05 Sex: male ? ?REASON FOR VISIT: Follow-up infrarenal abdominal aortic aneurysm ? ?HPI: ?Anthony Todd. is a 61 y.o. male here today for follow-up.  He has extensive past history.  He presented with limiting claudication and left common iliac artery occlusion.  He underwent right to left femorofemoral bypass with Dr. Oneida Alar on 09/14/2019.  He had difficulty with scrotal swelling following the procedure which eventually resolved.  He reports that he began having difficulty with cardiac arrhythmia and also ischemia following his femorofemoral bypass and is convinced that he had something "break off" when he had his femorofemoral bypass that caused his cardiac issues.  He has known infrarenal abdominal aortic aneurysm and underwent recent CT scan and is here today for discussion of this. ? ?Past Medical History:  ?Diagnosis Date  ? AAA (abdominal aortic aneurysm)   ? Back pain   ? Hyperlipidemia   ? Myocardial infarction [facility])   ? PAD (peripheral artery disease) ([place])   ? Stenosis of iliac artery ([facility])   ? ? ?Family History  ?Problem Relation Age of Onset  ? Diabetes Mother   ? Cancer Father   ? ? ?SOCIAL HISTORY: ?Social History  ? ?Tobacco Use  ? Smoking status: Every Day  ?  Packs/day: 2.00  ?  Types: Cigarettes  ? Smokeless tobacco: Never  ?Substance Use Topics  ? Alcohol use: Yes  ?  Comment: socially  ? ? ?Allergies  ?Allergen Reactions  ? Cymbalta [Duloxetine Hcl] Rash  ? ? ?Current Outpatient Medications  ?Medication Sig Dispense Refill  ? aspirin EC 81 MG tablet Take 81 mg by mouth at bedtime.    ? buprenorphine (BUTRANS) 15 MCG/HR once a week.    ? carvedilol (COREG) 6.25 MG tablet Take 1 tablet (6.25 mg total) by mouth 2 (two) times daily with a meal. 180 tablet 0  ? nitroGLYCERIN (NITROSTAT) 0.4 MG SL tablet Place 1 tablet under the tongue every 5 (five)  minutes as needed for chest pain. Up to 3 doses.    ? olmesartan (BENICAR) 20 MG tablet Take 20 mg by mouth daily.    ? oxyCODONE-acetaminophen (PERCOCET) 7.5-325 MG tablet Take 1 tablet by mouth 3 (three) times daily as needed.    ? rosuvastatin (CRESTOR) 20 MG tablet Take 1 tablet (20 mg total) by mouth daily. 90 tablet 0  ? ticagrelor (BRILINTA) 90 MG TABS tablet Take 1 tablet (90 mg total) by mouth 2 (two) times daily. 180 tablet 2  ? ?No current facility-administered medications for this visit.  ? ? ?REVIEW OF SYSTEMS:  ?[X]  denotes positive finding, [ ]  denotes negative finding ?Cardiac  Comments:  ?Chest pain or chest pressure:    ?Shortness of breath upon exertion:    ?Short of breath when lying flat:    ?Irregular heart rhythm:    ?    ?Vascular    ?Pain in calf, thigh, or hip brought on by ambulation:    ?Pain in feet at night that wakes you up from your sleep:     ?Blood clot in your veins:    ?Leg swelling:     ?    ? ? ?PHYSICAL EXAM: ?Vitals:  ? 09/27/21 1507  ?BP: 121/77  ?Pulse: 75  ?Weight: 242 lb 3.2 oz (109.9 kg)  ?Height: 5\' 9"  (1.753 m)  ? ? ?GENERAL: The patient is a well-nourished male, in no acute  distress. The vital signs are documented above. ?CARDIOVASCULAR: 2+ radial 2+ popliteal and 2+ dorsalis pedis pulses bilaterally. ?PULMONARY: There is good air exchange  ?MUSCULOSKELETAL: There are no major deformities or cyanosis. ?NEUROLOGIC: No focal weakness or paresthesias are detected. ?SKIN: There are no ulcers or rashes noted. ?PSYCHIATRIC: The patient has a normal affect. ? ?DATA:  ?CT scan from 09/21/2021 was reviewed with the patient.  This shows patency of his femorofemoral bypass with no evidence of pseudoaneurysm or other issues.  He has an occluded left iliac artery.  He does have a 3.2 cm infrarenal abdominal aortic aneurysm ? ?MEDICAL ISSUES: ?Had a very long discussion with the patient regarding his aneurysm.  I explained that this is a very small aneurysm and would recommend  ultrasound follow-up in 2 years to rule out progression in size.  Explained that we would recommend surgery if his aneurysm grew to the 5 and or 5-1/2 cm level unless it grew rapidly. ? ?So discussed his prior femorofemoral bypass and explained that it is physically impossible for something to break off from the femoral artery and go to the heart.  I explained that it is certainly possible that he had new onset of cardiac difficulties around the time of his surgery.  We will see him again in 2 years with duplex ? ? ? ?Rosetta Posner, MD FACS ?[facility] [place] ?Office Tel [phone] ? ?Note: Portions of this report may have been transcribed using voice recognition software.  Every effort has been made to ensure accuracy; however, inadvertent computerized transcription errors may still be present. ?

## 2021-10-03 ENCOUNTER — Encounter: Payer: Self-pay | Admitting: Internal Medicine

## 2021-10-03 ENCOUNTER — Ambulatory Visit (INDEPENDENT_AMBULATORY_CARE_PROVIDER_SITE_OTHER): Payer: Medicare PPO | Admitting: Internal Medicine

## 2021-10-03 VITALS — BP 138/80 | HR 73 | Ht 69.0 in | Wt 241.2 lb

## 2021-10-03 DIAGNOSIS — I10 Essential (primary) hypertension: Secondary | ICD-10-CM | POA: Diagnosis not present

## 2021-10-16 DIAGNOSIS — R0981 Nasal congestion: Secondary | ICD-10-CM | POA: Diagnosis not present

## 2021-10-16 DIAGNOSIS — J381 Polyp of vocal cord and larynx: Secondary | ICD-10-CM | POA: Diagnosis not present

## 2021-10-16 DIAGNOSIS — J343 Hypertrophy of nasal turbinates: Secondary | ICD-10-CM | POA: Diagnosis not present

## 2021-10-16 DIAGNOSIS — J342 Deviated nasal septum: Secondary | ICD-10-CM | POA: Diagnosis not present

## 2021-10-16 DIAGNOSIS — J3489 Other specified disorders of nose and nasal sinuses: Secondary | ICD-10-CM | POA: Diagnosis not present

## 2021-10-24 DIAGNOSIS — F17210 Nicotine dependence, cigarettes, uncomplicated: Secondary | ICD-10-CM | POA: Diagnosis not present

## 2021-10-24 DIAGNOSIS — Z6835 Body mass index (BMI) 35.0-35.9, adult: Secondary | ICD-10-CM | POA: Diagnosis not present

## 2021-10-24 DIAGNOSIS — R030 Elevated blood-pressure reading, without diagnosis of hypertension: Secondary | ICD-10-CM | POA: Diagnosis not present

## 2021-10-24 DIAGNOSIS — F17200 Nicotine dependence, unspecified, uncomplicated: Secondary | ICD-10-CM | POA: Diagnosis not present

## 2021-10-24 DIAGNOSIS — R7989 Other specified abnormal findings of blood chemistry: Secondary | ICD-10-CM | POA: Diagnosis not present

## 2021-10-24 DIAGNOSIS — G894 Chronic pain syndrome: Secondary | ICD-10-CM | POA: Diagnosis not present

## 2021-10-24 DIAGNOSIS — Z79899 Other long term (current) drug therapy: Secondary | ICD-10-CM | POA: Diagnosis not present

## 2021-10-24 DIAGNOSIS — M961 Postlaminectomy syndrome, not elsewhere classified: Secondary | ICD-10-CM | POA: Diagnosis not present

## 2021-10-30 DIAGNOSIS — Z79899 Other long term (current) drug therapy: Secondary | ICD-10-CM | POA: Diagnosis not present

## 2021-11-07 ENCOUNTER — Telehealth: Payer: Self-pay | Admitting: Internal Medicine

## 2021-11-07 NOTE — Telephone Encounter (Signed)
Pt would like to do a provider switch ?From: Anthony Todd  ?[id number]  ?

## 2021-11-07 NOTE — Telephone Encounter (Signed)
My patient template is overloaded and I just don't have the slots for follow up the patients need, if just saw Anthony Todd  in April would suggest establishing with our new provider Anthony Todd who will be starting in September ? ? ?Dominga Ferry MD ?

## 2021-11-13 NOTE — Telephone Encounter (Signed)
The patient had vitals taken on 4/4 but appears to have left as he was not on my template and there is no note. Agree with Dr. Wyline Mood. He does not appear to have EP problems as well. GT ?

## 2021-11-23 DIAGNOSIS — F17200 Nicotine dependence, unspecified, uncomplicated: Secondary | ICD-10-CM | POA: Diagnosis not present

## 2021-11-23 DIAGNOSIS — R7989 Other specified abnormal findings of blood chemistry: Secondary | ICD-10-CM | POA: Diagnosis not present

## 2021-11-23 DIAGNOSIS — Z79899 Other long term (current) drug therapy: Secondary | ICD-10-CM | POA: Diagnosis not present

## 2021-11-23 DIAGNOSIS — M961 Postlaminectomy syndrome, not elsewhere classified: Secondary | ICD-10-CM | POA: Diagnosis not present

## 2021-11-23 DIAGNOSIS — Z6835 Body mass index (BMI) 35.0-35.9, adult: Secondary | ICD-10-CM | POA: Diagnosis not present

## 2021-11-23 DIAGNOSIS — F17210 Nicotine dependence, cigarettes, uncomplicated: Secondary | ICD-10-CM | POA: Diagnosis not present

## 2021-11-23 DIAGNOSIS — R030 Elevated blood-pressure reading, without diagnosis of hypertension: Secondary | ICD-10-CM | POA: Diagnosis not present

## 2021-11-23 DIAGNOSIS — I714 Abdominal aortic aneurysm, without rupture, unspecified: Secondary | ICD-10-CM | POA: Diagnosis not present

## 2021-11-23 DIAGNOSIS — M47816 Spondylosis without myelopathy or radiculopathy, lumbar region: Secondary | ICD-10-CM | POA: Diagnosis not present

## 2021-11-23 DIAGNOSIS — G894 Chronic pain syndrome: Secondary | ICD-10-CM | POA: Diagnosis not present

## 2021-11-24 ENCOUNTER — Encounter (HOSPITAL_COMMUNITY): Payer: Self-pay | Admitting: *Deleted

## 2021-11-24 ENCOUNTER — Other Ambulatory Visit: Payer: Self-pay

## 2021-11-24 ENCOUNTER — Emergency Department (HOSPITAL_COMMUNITY)
Admission: EM | Admit: 2021-11-24 | Discharge: 2021-11-24 | Disposition: A | Discharge status: Home / Self Care | Payer: Medicare PPO | Attending: Emergency Medicine | Admitting: Emergency Medicine

## 2021-11-24 DIAGNOSIS — T7840XA Allergy, unspecified, initial encounter: Secondary | ICD-10-CM

## 2021-11-24 DIAGNOSIS — G894 Chronic pain syndrome: Secondary | ICD-10-CM | POA: Insufficient documentation

## 2021-11-24 DIAGNOSIS — Z79899 Other long term (current) drug therapy: Secondary | ICD-10-CM | POA: Diagnosis not present

## 2021-11-24 DIAGNOSIS — R21 Rash and other nonspecific skin eruption: Secondary | ICD-10-CM | POA: Diagnosis present

## 2021-11-24 DIAGNOSIS — L509 Urticaria, unspecified: Secondary | ICD-10-CM | POA: Diagnosis not present

## 2021-11-24 DIAGNOSIS — Z7982 Long term (current) use of aspirin: Secondary | ICD-10-CM | POA: Insufficient documentation

## 2021-11-24 MED ORDER — DIPHENHYDRAMINE HCL 50 MG/ML IJ SOLN
25.0000 mg | Freq: Once | INTRAMUSCULAR | Status: AC
  Administered 2021-11-24: 25 mg via INTRAVENOUS
  Filled 2021-11-24: qty 1

## 2021-11-24 MED ORDER — PREDNISONE 10 MG PO TABS: 50.0000 mg | ORAL_TABLET | Freq: Every day | ORAL | 0 refills | Status: DC

## 2021-11-24 MED ORDER — FAMOTIDINE IN NACL 20-0.9 MG/50ML-% IV SOLN
20.0000 mg | Freq: Once | INTRAVENOUS | Status: AC
  Administered 2021-11-24: 20 mg via INTRAVENOUS
  Filled 2021-11-24: qty 50

## 2021-11-24 MED ORDER — DIPHENHYDRAMINE HCL 25 MG PO TABS: 25.0000 mg | ORAL_TABLET | Freq: Four times a day (QID) | ORAL | 0 refills | Status: DC | PRN

## 2021-11-24 MED ORDER — METHYLPREDNISOLONE SODIUM SUCC 125 MG IJ SOLR
125.0000 mg | Freq: Once | INTRAMUSCULAR | Status: AC
  Administered 2021-11-24: 125 mg via INTRAVENOUS
  Filled 2021-11-24: qty 2

## 2021-11-24 NOTE — ED Provider Notes (Signed)
[facility] EMERGENCY DEPARTMENT Provider Note   CSN: [id number] Arrival date & time: 11/24/21  0703     History  Chief Complaint  Patient presents with   Allergic Reaction    Anthony Todd. is a 61 y.o. male.  HPI    61 year old patient comes in with chief complaint of allergic reaction.  Patient indicates that he went to sleep feeling well.  In the morning, he woke up feeling itchy over his groin region.  Then the itching spread over his abdomen.  When he took a look, he saw a feels like rash all over his lower torso and groin area, getting concerned.  He denies any exposures yesterday.  No new chemicals, new body products.  Patient was seen at pain clinic yesterday and had a steroid shot and some new pain medication shot.  He indicates that he is using some pain patch that normally causes localized reaction at the patch site, and that he has been off of that patch for a week now.  Patient denies any chest pain, shortness of breath, wheezing.  Home Medications Prior to Admission medications   Medication Sig Start Date End Date Taking? Authorizing Provider  aspirin EC 81 MG tablet Take 81 mg by mouth at bedtime.   Yes [provider]  buprenorphine (BUTRANS) 15 MCG/HR 1 patch once a week. 09/15/21  Yes [provider]  carvedilol (COREG) 6.25 MG tablet Take 1 tablet (6.25 mg total) by mouth 2 (two) times daily with a meal. 10/27/19  Yes Arty Baumgartner, NP  diphenhydrAMINE (BENADRYL) 25 MG tablet Take 1 tablet (25 mg total) by mouth every 6 (six) hours as needed. 11/24/21  Yes Kyshawn Teal, MD  furosemide (LASIX) 20 MG tablet Take 20 mg by mouth daily. 10/03/21  Yes [provider]  nitroGLYCERIN (NITROSTAT) 0.4 MG SL tablet Place 1 tablet under the tongue every 5 (five) minutes as needed for chest pain. Up to 3 doses. 10/20/19  Yes [provider]  olmesartan (BENICAR) 20 MG tablet Take 20 mg by mouth daily. 10/21/19  Yes [provider]  oxyCODONE-acetaminophen (PERCOCET) 7.5-325 MG tablet Take 1 tablet by mouth 3 (three) times daily as needed. 10/28/19  Yes [provider]  predniSONE (DELTASONE) 10 MG tablet Take 5 tablets (50 mg total) by mouth daily. 11/25/21  Yes Derwood Kaplan, MD  ticagrelor (BRILINTA) 90 MG TABS tablet Take 1 tablet (90 mg total) by mouth 2 (two) times daily. 10/27/19  Yes Laverda Page B, NP  rosuvastatin (CRESTOR) 20 MG tablet Take 1 tablet (20 mg total) by mouth daily. Patient not taking: Reported on 11/24/2021 10/28/19   Arty Baumgartner, NP      Allergies    Cymbalta [duloxetine hcl]    Review of Systems   Review of Systems  All other systems reviewed and are negative.  Physical Exam Updated Vital Signs BP (!) 147/84   Pulse 78   Temp 98 F (36.7 C) (Oral)   Resp 20   Ht 5\' 9"  (1.753 m)   Wt 105.2 kg   SpO2 97%   BMI 34.26 kg/m  Physical Exam Vitals and nursing note reviewed.  Constitutional:      Appearance: He is well-developed.  HENT:     Head: Atraumatic.  Cardiovascular:     Rate and Rhythm: Normal rate.  Pulmonary:     Effort: Pulmonary effort is normal.     Breath sounds: No wheezing.  Musculoskeletal:  Cervical back: Neck supple.  Skin:    General: Skin is warm.     Findings: Rash present.     Comments: Diffuse erythematous rash. The rash is urticarial over the gluteal region and posterior thigh.  The rash is now flat (patch) over the anterior groin and lower torso.  Oral exam is reassuring.  Lungs are clear.  Neurological:     Mental Status: He is alert and oriented to person, place, and time.    ED Results / Procedures / Treatments   Labs (all labs ordered are listed, but only abnormal results are displayed) Labs Reviewed - No data to display  EKG None  Radiology No results found.  Procedures Procedures    Medications Ordered in ED Medications  diphenhydrAMINE (BENADRYL) injection 25 mg (25 mg Intravenous Given  11/24/21 0808)  famotidine (PEPCID) IVPB 20 mg premix (0 mg Intravenous Stopped 11/24/21 0857)  methylPREDNISolone sodium succinate (SOLU-MEDROL) 125 mg/2 mL injection 125 mg (125 mg Intravenous Given 11/24/21 0808)    ED Course/ Medical Decision Making/ A&P                           Medical Decision Making Risk OTC drugs. Prescription drug management.   This patient presents to the ED with chief complaint(s) of diffuse rash with pertinent past medical history of chronic pain syndrome with visit to the pain specialist yesterday received a new shot of pain medication along with hydrocortisone shot which further complicates the presenting complaint. The complaint involves an extensive differential diagnosis and also carries with it a high risk of complications and morbidity.    The differential diagnosis includes : Erythema migrans, hypersensitivity reaction, mastocytosis.  Clinically, the patient has urticarial/hypersensitivity type rash. Fortunately, does not appear to be anaphylaxis and there is no associated angioedema. Unclear trigger.  Treatment and Reassessment: Patient reassessed about 2 hours into his stay.  The itching is resolved.  The rash, though still present is looking less erythematous and is feeling better.  There is no progression certainly.  He wants to go home.  We will give him oral prednisone and Benadryl.  Requested follow-up with allergy clinic.   Final Clinical Impression(s) / ED Diagnoses Final diagnoses:  Urticaria  Allergic reaction, initial encounter    Rx / DC Orders ED Discharge Orders          Ordered    predniSONE (DELTASONE) 10 MG tablet  Daily        11/24/21 0931    diphenhydrAMINE (BENADRYL) 25 MG tablet  Every 6 hours PRN        11/24/21 0931              Derwood Kaplan, MD 11/24/21 1024

## 2021-11-24 NOTE — ED Triage Notes (Signed)
Pt woke up this am to rash all over body; pt denies any soaps, foods or detergents;   Pt states the only new product he used was the salpas lidocaine patch to his back;   Pt states he received a steroid shot and pain medication shot yesterday at 4:30 after seeing his doctor

## 2021-11-24 NOTE — Discharge Instructions (Signed)
We saw you in the ER after you had the allergic reaction.   We are not sure what caused the reaction, and it is important for you to follow up with an allergist. Please take the medications prescribed. PLEASE RETURN TO THE ER IMMEDIATELY IN CASE YOU START HAVING WORSENING SWELLING, DIFFICULTY IN BREATHING ETC.

## 2021-11-27 DIAGNOSIS — Z79899 Other long term (current) drug therapy: Secondary | ICD-10-CM | POA: Diagnosis not present

## 2021-12-25 DIAGNOSIS — Z6834 Body mass index (BMI) 34.0-34.9, adult: Secondary | ICD-10-CM | POA: Diagnosis not present

## 2021-12-25 DIAGNOSIS — Z79899 Other long term (current) drug therapy: Secondary | ICD-10-CM | POA: Diagnosis not present

## 2021-12-25 DIAGNOSIS — F17200 Nicotine dependence, unspecified, uncomplicated: Secondary | ICD-10-CM | POA: Diagnosis not present

## 2021-12-25 DIAGNOSIS — G894 Chronic pain syndrome: Secondary | ICD-10-CM | POA: Diagnosis not present

## 2021-12-25 DIAGNOSIS — F17210 Nicotine dependence, cigarettes, uncomplicated: Secondary | ICD-10-CM | POA: Diagnosis not present

## 2021-12-25 DIAGNOSIS — M961 Postlaminectomy syndrome, not elsewhere classified: Secondary | ICD-10-CM | POA: Diagnosis not present

## 2021-12-25 DIAGNOSIS — R7989 Other specified abnormal findings of blood chemistry: Secondary | ICD-10-CM | POA: Diagnosis not present

## 2021-12-25 DIAGNOSIS — R030 Elevated blood-pressure reading, without diagnosis of hypertension: Secondary | ICD-10-CM | POA: Diagnosis not present

## 2021-12-25 DIAGNOSIS — M47816 Spondylosis without myelopathy or radiculopathy, lumbar region: Secondary | ICD-10-CM | POA: Diagnosis not present

## 2021-12-28 DIAGNOSIS — Z79899 Other long term (current) drug therapy: Secondary | ICD-10-CM | POA: Diagnosis not present

## 2022-01-16 DIAGNOSIS — Z79899 Other long term (current) drug therapy: Secondary | ICD-10-CM | POA: Diagnosis not present

## 2022-01-16 DIAGNOSIS — F17200 Nicotine dependence, unspecified, uncomplicated: Secondary | ICD-10-CM | POA: Diagnosis not present

## 2022-01-16 DIAGNOSIS — R030 Elevated blood-pressure reading, without diagnosis of hypertension: Secondary | ICD-10-CM | POA: Diagnosis not present

## 2022-01-16 DIAGNOSIS — R7989 Other specified abnormal findings of blood chemistry: Secondary | ICD-10-CM | POA: Diagnosis not present

## 2022-01-16 DIAGNOSIS — F17210 Nicotine dependence, cigarettes, uncomplicated: Secondary | ICD-10-CM | POA: Diagnosis not present

## 2022-01-16 DIAGNOSIS — M961 Postlaminectomy syndrome, not elsewhere classified: Secondary | ICD-10-CM | POA: Diagnosis not present

## 2022-01-16 DIAGNOSIS — G894 Chronic pain syndrome: Secondary | ICD-10-CM | POA: Diagnosis not present

## 2022-01-16 DIAGNOSIS — M47816 Spondylosis without myelopathy or radiculopathy, lumbar region: Secondary | ICD-10-CM | POA: Diagnosis not present

## 2022-01-16 DIAGNOSIS — Z6833 Body mass index (BMI) 33.0-33.9, adult: Secondary | ICD-10-CM | POA: Diagnosis not present

## 2022-02-26 DIAGNOSIS — M47816 Spondylosis without myelopathy or radiculopathy, lumbar region: Secondary | ICD-10-CM | POA: Diagnosis not present

## 2022-02-26 DIAGNOSIS — R030 Elevated blood-pressure reading, without diagnosis of hypertension: Secondary | ICD-10-CM | POA: Diagnosis not present

## 2022-02-26 DIAGNOSIS — G894 Chronic pain syndrome: Secondary | ICD-10-CM | POA: Diagnosis not present

## 2022-02-26 DIAGNOSIS — Z6833 Body mass index (BMI) 33.0-33.9, adult: Secondary | ICD-10-CM | POA: Diagnosis not present

## 2022-02-26 DIAGNOSIS — Z79899 Other long term (current) drug therapy: Secondary | ICD-10-CM | POA: Diagnosis not present

## 2022-02-26 DIAGNOSIS — F17200 Nicotine dependence, unspecified, uncomplicated: Secondary | ICD-10-CM | POA: Diagnosis not present

## 2022-02-26 DIAGNOSIS — F17210 Nicotine dependence, cigarettes, uncomplicated: Secondary | ICD-10-CM | POA: Diagnosis not present

## 2022-02-26 DIAGNOSIS — M961 Postlaminectomy syndrome, not elsewhere classified: Secondary | ICD-10-CM | POA: Diagnosis not present

## 2022-02-26 DIAGNOSIS — R7989 Other specified abnormal findings of blood chemistry: Secondary | ICD-10-CM | POA: Diagnosis not present

## 2022-02-28 DIAGNOSIS — I25119 Atherosclerotic heart disease of native coronary artery with unspecified angina pectoris: Secondary | ICD-10-CM | POA: Diagnosis not present

## 2022-02-28 DIAGNOSIS — E538 Deficiency of other specified B group vitamins: Secondary | ICD-10-CM | POA: Diagnosis not present

## 2022-03-02 ENCOUNTER — Encounter (HOSPITAL_COMMUNITY): Payer: Self-pay | Admitting: *Deleted

## 2022-03-02 ENCOUNTER — Emergency Department (HOSPITAL_COMMUNITY): Payer: Medicare PPO

## 2022-03-02 ENCOUNTER — Other Ambulatory Visit: Payer: Self-pay

## 2022-03-02 ENCOUNTER — Emergency Department (HOSPITAL_COMMUNITY)
Admission: EM | Admit: 2022-03-02 | Discharge: 2022-03-02 | Disposition: A | Discharge status: Home / Self Care | Payer: Medicare PPO | Attending: Emergency Medicine | Admitting: Emergency Medicine

## 2022-03-02 ENCOUNTER — Emergency Department (HOSPITAL_COMMUNITY)
Admission: EM | Admit: 2022-03-02 | Discharge: 2022-03-02 | Disposition: A | Discharge status: Home / Self Care | Payer: Medicare PPO | Source: Home / Self Care | Attending: Emergency Medicine | Admitting: Emergency Medicine

## 2022-03-02 DIAGNOSIS — W270XXA Contact with workbench tool, initial encounter: Secondary | ICD-10-CM | POA: Insufficient documentation

## 2022-03-02 DIAGNOSIS — S61311A Laceration without foreign body of left index finger with damage to nail, initial encounter: Secondary | ICD-10-CM

## 2022-03-02 DIAGNOSIS — R9431 Abnormal electrocardiogram [ECG] [EKG]: Secondary | ICD-10-CM | POA: Diagnosis not present

## 2022-03-02 DIAGNOSIS — Z79899 Other long term (current) drug therapy: Secondary | ICD-10-CM | POA: Insufficient documentation

## 2022-03-02 DIAGNOSIS — Z23 Encounter for immunization: Secondary | ICD-10-CM | POA: Diagnosis not present

## 2022-03-02 DIAGNOSIS — S62631B Displaced fracture of distal phalanx of left index finger, initial encounter for open fracture: Secondary | ICD-10-CM | POA: Diagnosis not present

## 2022-03-02 DIAGNOSIS — I10 Essential (primary) hypertension: Secondary | ICD-10-CM | POA: Insufficient documentation

## 2022-03-02 DIAGNOSIS — S62611A Displaced fracture of proximal phalanx of left index finger, initial encounter for closed fracture: Secondary | ICD-10-CM | POA: Diagnosis not present

## 2022-03-02 DIAGNOSIS — S67191A Crushing injury of left index finger, initial encounter: Secondary | ICD-10-CM

## 2022-03-02 DIAGNOSIS — I700 Atherosclerosis of aorta: Secondary | ICD-10-CM | POA: Diagnosis not present

## 2022-03-02 DIAGNOSIS — S62631D Displaced fracture of distal phalanx of left index finger, subsequent encounter for fracture with routine healing: Secondary | ICD-10-CM | POA: Diagnosis not present

## 2022-03-02 DIAGNOSIS — X58XXXA Exposure to other specified factors, initial encounter: Secondary | ICD-10-CM | POA: Insufficient documentation

## 2022-03-02 DIAGNOSIS — Z7982 Long term (current) use of aspirin: Secondary | ICD-10-CM | POA: Insufficient documentation

## 2022-03-02 DIAGNOSIS — S62621A Displaced fracture of medial phalanx of left index finger, initial encounter for closed fracture: Secondary | ICD-10-CM | POA: Diagnosis not present

## 2022-03-02 DIAGNOSIS — S62601B Fracture of unspecified phalanx of left index finger, initial encounter for open fracture: Secondary | ICD-10-CM

## 2022-03-02 DIAGNOSIS — Z01818 Encounter for other preprocedural examination: Secondary | ICD-10-CM | POA: Diagnosis not present

## 2022-03-02 DIAGNOSIS — S6992XA Unspecified injury of left wrist, hand and finger(s), initial encounter: Secondary | ICD-10-CM | POA: Diagnosis present

## 2022-03-02 DIAGNOSIS — Z7901 Long term (current) use of anticoagulants: Secondary | ICD-10-CM | POA: Insufficient documentation

## 2022-03-02 DIAGNOSIS — S62631A Displaced fracture of distal phalanx of left index finger, initial encounter for closed fracture: Secondary | ICD-10-CM | POA: Diagnosis not present

## 2022-03-02 DIAGNOSIS — S61211A Laceration without foreign body of left index finger without damage to nail, initial encounter: Secondary | ICD-10-CM | POA: Diagnosis not present

## 2022-03-02 LAB — COMPREHENSIVE METABOLIC PANEL
ALT: 33 U/L (ref 0–44)
AST: 16 U/L (ref 15–41)
Albumin: 3.9 g/dL (ref 3.5–5.0)
Alkaline Phosphatase: 63 U/L (ref 38–126)
Anion gap: 8 (ref 5–15)
BUN: 9 mg/dL (ref 6–20)
CO2: 25 mmol/L (ref 22–32)
Calcium: 9.1 mg/dL (ref 8.9–10.3)
Chloride: 108 mmol/L (ref 98–111)
Creatinine, Ser: 0.97 mg/dL (ref 0.61–1.24)
GFR, Estimated: >60 mL/min (ref >60)
Glucose, Bld: 134 mg/dL — ABNORMAL HIGH (ref 70–99)
Potassium: 3.9 mmol/L (ref 3.5–5.1)
Sodium: 141 mmol/L (ref 135–145)
Total Bilirubin: 0.7 mg/dL (ref 0.3–1.2)
Total Protein: 6.5 g/dL (ref 6.5–8.1)

## 2022-03-02 LAB — CBC WITH DIFFERENTIAL/PLATELET
Abs Immature Granulocytes: 0.05 10*3/uL (ref 0.00–0.07)
Basophils Absolute: 0.1 10*3/uL (ref 0.0–0.1)
Basophils Relative: 1 %
Eosinophils Absolute: 0.3 10*3/uL (ref 0.0–0.5)
Eosinophils Relative: 3 %
HCT: 43 % (ref 39.0–52.0)
Hemoglobin: 14.6 g/dL (ref 13.0–17.0)
Immature Granulocytes: 0 %
Lymphocytes Relative: 25 %
Lymphs Abs: 2.8 10*3/uL (ref 0.7–4.0)
MCH: 30.4 pg (ref 26.0–34.0)
MCHC: 34 g/dL (ref 30.0–36.0)
MCV: 89.4 fL (ref 80.0–100.0)
Monocytes Absolute: 0.8 10*3/uL (ref 0.1–1.0)
Monocytes Relative: 8 %
Neutro Abs: 7.1 10*3/uL (ref 1.7–7.7)
Neutrophils Relative %: 63 %
Platelets: 172 10*3/uL (ref 150–400)
RBC: 4.81 MIL/uL (ref 4.22–5.81)
RDW: 13.9 % (ref 11.5–15.5)
WBC: 11.2 10*3/uL — ABNORMAL HIGH (ref 4.0–10.5)
nRBC: 0 % (ref 0.0–0.2)

## 2022-03-02 MED ORDER — POVIDONE-IODINE 10 % EX SOLN
CUTANEOUS | Status: AC
  Administered 2022-03-02: 1
  Filled 2022-03-02: qty 14.8

## 2022-03-02 MED ORDER — AMOXICILLIN-POT CLAVULANATE 875-125 MG PO TABS: 1.0000 | ORAL_TABLET | Freq: Two times a day (BID) | ORAL | 0 refills | Status: DC

## 2022-03-02 MED ORDER — DOXYCYCLINE HYCLATE 100 MG PO TABS
100.0000 mg | ORAL_TABLET | Freq: Once | ORAL | Status: AC
  Administered 2022-03-02: 100 mg via ORAL
  Filled 2022-03-02: qty 1

## 2022-03-02 MED ORDER — TETANUS-DIPHTH-ACELL PERTUSSIS 5-2.5-18.5 LF-MCG/0.5 IM SUSY
0.5000 mL | PREFILLED_SYRINGE | Freq: Once | INTRAMUSCULAR | Status: AC
Start: 2022-03-02 — End: 2022-03-02
  Administered 2022-03-02: 0.5 mL via INTRAMUSCULAR
  Filled 2022-03-02: qty 0.5

## 2022-03-02 MED ORDER — CEFAZOLIN SODIUM-DEXTROSE 1-4 GM/50ML-% IV SOLN
1.0000 g | Freq: Once | INTRAVENOUS | Status: AC
  Administered 2022-03-02: 1 g via INTRAVENOUS
  Filled 2022-03-02: qty 50

## 2022-03-02 MED ORDER — OXYCODONE-ACETAMINOPHEN 5-325 MG PO TABS
1.0000 | ORAL_TABLET | Freq: Once | ORAL | Status: AC
  Administered 2022-03-02: 1 via ORAL
  Filled 2022-03-02: qty 1

## 2022-03-02 NOTE — ED Provider Notes (Cosign Needed)
[facility] EMERGENCY DEPARTMENT Provider Note   CSN: [id number] Arrival date & time: 03/02/22  1333     History  Chief Complaint  Patient presents with   Finger Injury    Tahmir Todd. is a 61 y.o. male with history of AAA, PAD, HLD, iliac artery stenosis, NSTEMI, and HTN on Brilinta who presents to the emergency department for left index finger laceration from earlier today with a table saw. Right hand dependent. Tetanus unknown. No other injury.   HPI     Home Medications Prior to Admission medications   Medication Sig Start Date End Date Taking? Authorizing Provider  amoxicillin-clavulanate (AUGMENTIN) 875-125 MG tablet Take 1 tablet by mouth every 12 (twelve) hours. Patient not taking: Reported on 03/02/2022 03/02/22  Yes Anden Bartolo T, PA-C  aspirin EC 81 MG tablet Take 81 mg by mouth at bedtime.    [provider]  baclofen (LIORESAL) 10 MG tablet Take 10 mg by mouth 3 (three) times daily.    [provider]  carvedilol (COREG) 6.25 MG tablet Take 1 tablet (6.25 mg total) by mouth 2 (two) times daily with a meal. 10/27/19   Laverda Page B, NP  cholecalciferol (VITAMIN D3) 25 MCG (1000 UNIT) tablet Take 1,000 Units by mouth daily.    [provider]  fluticasone (FLONASE) 50 MCG/ACT nasal spray Place 1-2 sprays into both nostrils daily as needed for allergies or rhinitis.    [provider]  furosemide (LASIX) 20 MG tablet Take 20 mg by mouth in the morning. 10/03/21   [provider]  gabapentin (NEURONTIN) 300 MG capsule Take 300 mg by mouth 3 (three) times daily.    [provider]  nitroGLYCERIN (NITROSTAT) 0.4 MG SL tablet Place 1 tablet under the tongue every 5 (five) minutes as needed for chest pain. Up to 3 doses. 10/20/19   [provider]  olmesartan (BENICAR) 20 MG tablet Take 20 mg by mouth daily. 10/21/19   [provider]  oxyCODONE-acetaminophen (PERCOCET) 10-325 MG tablet Take 1  tablet by mouth 3 (three) times daily.    [provider]  rosuvastatin (CRESTOR) 20 MG tablet Take 1 tablet (20 mg total) by mouth daily. 10/28/19   Arty Baumgartner, NP  ticagrelor (BRILINTA) 90 MG TABS tablet Take 1 tablet (90 mg total) by mouth 2 (two) times daily. 10/27/19   Arty Baumgartner, NP  XTAMPZA ER 9 MG C12A Take 9 mg by mouth every 12 (twelve) hours.    [provider]      Allergies    Wound dressing adhesive and Cymbalta [duloxetine hcl]    Review of Systems   Review of Systems  Skin:  Positive for wound.  All other systems reviewed and are negative.   Physical Exam Updated Vital Signs BP 120/80   Pulse (!) 57   Temp 98 F (36.7 C)   Resp 18   Ht 5\' 9"  (1.753 m)   Wt 105.2 kg   SpO2 98%   BMI 34.26 kg/m  Physical Exam Vitals and nursing note reviewed.  Constitutional:      Appearance: Normal appearance.  HENT:     Head: Normocephalic and atraumatic.  Eyes:     Conjunctiva/sclera: Conjunctivae normal.  Pulmonary:     Effort: Pulmonary effort is normal. No respiratory distress.  Musculoskeletal:     Comments: Deep laceration with macerated wound of the left pointer finger extending from the lateral PIP to the tip.  Unable to  visualize nail.  Displacement of distal finger volarly and proximally.  As imaged below.  Skin:    General: Skin is warm and dry.  Neurological:     Mental Status: He is alert.  Psychiatric:        Mood and Affect: Mood normal.        Behavior: Behavior normal.            ED Results / Procedures / Treatments   Labs (all labs ordered are listed, but only abnormal results are displayed) Labs Reviewed - No data to display  EKG None  Radiology DG Chest [employer] 1 View  Result Date: 03/02/2022 CLINICAL DATA:  Preop chest radiograph. EXAM: PORTABLE CHEST 1 VIEW COMPARISON:  Chest radiograph dated 10/23/2019. FINDINGS: Mild chronic interstitial coarsening. No focal consolidation, pleural effusion, or  pneumothorax. The cardiac silhouette is within limits. Atherosclerotic calcification of the aortic arch. No acute osseous pathology. IMPRESSION: No active disease. Electronically Signed   By: Elgie Collard M.D.   On: 03/02/2022 21:11   DG Finger Index Left  Result Date: 03/02/2022 CLINICAL DATA:  Trauma.  Cut finger with table saw. EXAM: LEFT INDEX FINGER 2+V COMPARISON:  None Available. FINDINGS: There is diffuse comminuted trauma seen to the middle and distal phalanges of the index finger. There is a complete transverse fracture of the proximal metaphysis of the distal phalanx with displacement of the distal fracture component in the volar direction approximately 7 mm and proximally approximately 11 mm. There are multiple tiny comminuted fracture components. There is extensive comminuted fracturing of the proximal 80-90% of the middle phalanx of the index finger with approximately 4 mm dorsal displacement of the dominant distal fracture component. Multiple small cortical fragments. This fracture extends to the proximal articular surface. The PIP joint does appear to be normally aligned. IMPRESSION: 1. Markedly comminuted acute fracturing of the middle and distal phalanges of the index finger. The fractures within the middle phalanx extend to the proximal articular surface at the PIP joint which appears appropriately aligned. 2. The dominant fracture within the distal phalanx is at the proximal aspect, and the distal fracture component is displaced in the volar and proximal directions so that the dorsal aspect of the distal fracture component abuts the volar aspect of the DIP joint. Electronically Signed   By: Neita Garnet M.D.   On: 03/02/2022 14:31    Procedures Procedures    Medications Ordered in ED Medications  povidone-iodine (BETADINE) 10 % external solution (1 Application  Given 03/02/22 1421)  oxyCODONE-acetaminophen (PERCOCET/ROXICET) 5-325 MG per tablet 1 tablet (1 tablet Oral Given 03/02/22  1433)  doxycycline (VIBRA-TABS) tablet 100 mg (100 mg Oral Given 03/02/22 1445)  Tdap (BOOSTRIX) injection 0.5 mL (0.5 mLs Intramuscular Given 03/02/22 1445)    ED Course/ Medical Decision Making/ A&P Clinical Course as of 03/03/22 1435  Fri Mar 02, 2022  4652 61 year old male who is predominantly right-handed presenting to ED with a left finger laceration from a table saw today.  On exam he has an open comminuted, complex fracture of the left distal finger.  The wound is gaping and not amenable to bedside suturing or closure.  He has some mild sensation of the distal fingertip, but there is displacement of the fingertip.  I suspect tendon rupture from the laceration.  The wound was irrigated at the bedside.  We will update his tetanus start him on doxycycline.  We have a phone call out to the hand surgeon on-call. [MT]  1500 Requested  consultation with on call orthopedic surgeon Dr Romeo Apple [LR]  1558 Heard back from orthopedic surgeon Dr Romeo Apple -- I explained my concerns that this wound was too complex for bedside ED closure, and that I felt the patient would benefit from operative or specialist evaluation. He viewed the images and x-rays and he believed that while the patient would require surgery, he felt it did not need to be performed immediately. We discussed possibility of transferring to [facility] for urgent evaluation, and it was his opinion that that would be unnecessary. He recommended bandaging and splinting with finger cage as well as placing patient on antibiotics.  [LR]  1630 I informed the patient of Dr. Mort Sawyers recommendation for outpatient treatment next week. Patient is adamant that he would like a second opinion and wants to go to [facility].  Wound had been dressed with xeroform gauze and pressure bandage at this point. Initially believed we had achieved hemostasis but I visualized blood seeping through the bandages. I asked the patient to wait until I could consult orthopedics on  call at [facility] to get their opinion before leaving our department.  [LR]  1650 Consulted and discussed the case with orthopedic surgeon Dr Dion Saucier on call at [facility]. He viewed the images from the patient's chart and recommended surgery, likely partial amputation, and that he could do this tomorrow. He recommended patient check in to short stay hospital at [facility] tomorrow, and hold ASA and Brilinta starting this evening. [LR]  1700 Went out to the patient's car when he had grabbed more wrappings around his hand and had planned to leave to drive to [facility] for another opinion. I was able to speak with him before he left. I discussed Dr Shelba Flake plan for surgery the following day, and how it would more than likely be a partial amputation.   I also discussed that I had further plans to address the continued bleeding of his wound, but he did not want to stay any further and wanted to attempt more pressure/bandaging at home. He felt staying in our department any longer was "a waste of time".   I advised him that Dr Dion Saucier had recommended surgery the following day and he was to present to [facility] short stay tomorrow. He understands he is to be NPO after midnight, and hold both Brilinta and ASA. Also advised him to pick up antibiotic prescription and take first dose in the evening. He was discharged with follow up instructions.  [LR]    Clinical Course User Index [LR] Denese Mentink, Lora Paula, PA-C [MT] Trifan, Kermit Balo, MD                           Medical Decision Making Amount and/or Complexity of Data Reviewed Radiology: ordered.  Risk Prescription drug management.   This patient is a 61 y.o. male  who presents to the ED for concern of hand injury.   Past Medical History / Co-morbidities: AAA, PAD, hyperlipidemia, iliac artery stenosis, NSTEMI, hypertension, on chronic anticoagulation with Brilinta  Physical Exam: Physical exam performed. The pertinent findings include: Deep laceration and  maceration of left pointer finger with displacement of distal most portal volarly and proximally.   Lab Tests/Imaging studies: I personally interpreted labs/imaging and the pertinent results include: Comminuted acute fracture of middle and distal phalanges of index finger, with displacement of distal fracture component in volar and proximal directions. I agree with the radiologist interpretation.  Medications:  I ordered medication including oxycodone.  I have reviewed the patients home medicines and have made adjustments as needed.  Consultations obtained: I consulted with orthopedic surgeon Dr Romeo Apple who recommended splinting with finger cage and he will evaluate for surgery on Tuesday.  I consulted with orthopedic surgeon Dr Dion Saucier on call at [facility]. He viewed the images of the wound and offered to operate on the patient tomorrow, but did not feel ER to ER transfer was necessary.    Disposition: After consideration of the diagnostic results and the patients response to treatment, I feel that emergency department workup does not suggest an emergent condition requiring admission or immediate intervention beyond what has been performed at this time. The plan is: Discharge home with antibiotic prescription and given instructions for surgery tomorrow. Advised that I would like to continue addressing patient's continued bleeding wound, but he refused further evaluation and would like to go home. Given close return precautions. Advised to be NPO after midnight, hold brilinta and aspirin, and pick up antibiotics.   Final Clinical Impression(s) / ED Diagnoses Final diagnoses:  Laceration of left index finger without foreign body with damage to nail, initial encounter  Crushing injury of left index finger, initial encounter  Open displaced fracture of distal phalanx of left index finger, initial encounter    Rx / DC Orders ED Discharge Orders          Ordered    amoxicillin-clavulanate  (AUGMENTIN) 875-125 MG tablet  Every 12 hours        03/02/22 1706           Portions of this report may have been transcribed using voice recognition software. Every effort was made to ensure accuracy; however, inadvertent computerized transcription errors may be present.    Su Monks, PA-C 03/02/22 1737    Dema Timmons T, PA-C 03/03/22 1440    Terald Sleeper, MD 03/03/22 1455

## 2022-03-02 NOTE — ED Provider Triage Note (Signed)
Emergency Medicine Provider Triage Evaluation Note  Anthony Todd. , a 61 y.o. male  was evaluated in triage.  Pt complains of finger injury. Evaluated at [facility] prior to this, wound continues to bleed, no control in wound despite bandage. According to records, previously consulted Dr. Dion Saucier..  Review of Systems  Positive: wound Negative:   Physical Exam  BP (!) 172/82 (BP Location: Right Arm)   Pulse 93   Temp 98.2 F (36.8 C) (Oral)   Resp 18   SpO2 97%  Gen:   Awake, no distress   Resp:  Normal effort  MSK:   Moves extremities without difficulty  Other:    Medical Decision Making  Medically screening exam initiated at 6:46 PM.  Appropriate orders placed.  Anthony Todd. was informed that the remainder of the evaluation will be completed by another provider, this initial triage assessment does not replace that evaluation, and the importance of remaining in the ED until their evaluation is complete.  Patient on blood thinners here worsening bleeding, prior EDP spoke to Dr. Dion Saucier who recommended short stay for him TOMORROW.  I have placed a call out to Dr. Dion Saucier.    Claude Manges, PA-C 03/02/22 1848

## 2022-03-02 NOTE — ED Triage Notes (Signed)
Pt c/o left index finger injury today after cutting it with a table saw. Pt is on blood thinners.

## 2022-03-02 NOTE — ED Notes (Signed)
Area started dripping blood. Finger wrapped with xerofoam with moderate pressure dressing. Extremity elevated

## 2022-03-02 NOTE — ED Triage Notes (Signed)
Pt arrives with continued bleeding from hand after a table saw injury around noon today. Seen at [facility] for same. On ASA and Brillinta. Plan for possible surgery tomorrow but bleeding still uncontrolled. Last dose of thinners this morning.

## 2022-03-02 NOTE — Discharge Instructions (Addendum)
Someone from Dr Shelba Flake office should be calling you to schedule surgery.  Go to [facility] [address] and you will be admitted to the short stay hospital for surgery. Hold your aspirin and brilinta this evening.  I sent antibiotics to your pharmacy, please start them tonight.

## 2022-03-02 NOTE — ED Notes (Signed)
Pt dc at 1710

## 2022-03-02 NOTE — ED Notes (Signed)
Provider aware

## 2022-03-02 NOTE — ED Notes (Signed)
Pt given turkey sandwich and gingerale. 

## 2022-03-02 NOTE — ED Provider Notes (Signed)
[facility] EMERGENCY DEPARTMENT Provider Note   CSN: [id number] Arrival date & time: 03/02/22  1839     History  Chief Complaint  Patient presents with   Hand Injury    Anthony Todd. is a 61 y.o. male who presents emergency department for left index finger injury.  Patient was seen earlier today with an open fracture of the finger.  He states that they soaked it in water and iodine.  He was given oral doxycycline and told to come back to [facility] in the morning for surgery.  He had his tetanus updated.  Patient states that his finger has continued to bleed profusely and so he came in for further evaluation.  Patient states that he was not given any IV or IM Ancef prior to evaluation.  Patient was discharged but came straight to the emergency department here.   Hand Injury      Home Medications Prior to Admission medications   Medication Sig Start Date End Date Taking? Authorizing Provider  amoxicillin-clavulanate (AUGMENTIN) 875-125 MG tablet Take 1 tablet by mouth every 12 (twelve) hours. 03/02/22   Roemhildt, Lorin T, PA-C  aspirin EC 81 MG tablet Take 81 mg by mouth at bedtime.    [provider]  buprenorphine (BUTRANS) 15 MCG/HR 1 patch once a week. 09/15/21   [provider]  carvedilol (COREG) 6.25 MG tablet Take 1 tablet (6.25 mg total) by mouth 2 (two) times daily with a meal. 10/27/19   Arty Baumgartner, NP  diphenhydrAMINE (BENADRYL) 25 MG tablet Take 1 tablet (25 mg total) by mouth every 6 (six) hours as needed. 11/24/21   Derwood Kaplan, MD  furosemide (LASIX) 20 MG tablet Take 20 mg by mouth daily. 10/03/21   [provider]  nitroGLYCERIN (NITROSTAT) 0.4 MG SL tablet Place 1 tablet under the tongue every 5 (five) minutes as needed for chest pain. Up to 3 doses. 10/20/19   [provider]  olmesartan (BENICAR) 20 MG tablet Take 20 mg by mouth daily. 10/21/19   [provider]  oxyCODONE-acetaminophen  (PERCOCET) 7.5-325 MG tablet Take 1 tablet by mouth 3 (three) times daily as needed. 10/28/19   [provider]  predniSONE (DELTASONE) 10 MG tablet Take 5 tablets (50 mg total) by mouth daily. 11/25/21   Derwood Kaplan, MD  rosuvastatin (CRESTOR) 20 MG tablet Take 1 tablet (20 mg total) by mouth daily. Patient not taking: Reported on 11/24/2021 10/28/19   Laverda Page B, NP  ticagrelor (BRILINTA) 90 MG TABS tablet Take 1 tablet (90 mg total) by mouth 2 (two) times daily. 10/27/19   Arty Baumgartner, NP      Allergies    Cymbalta [duloxetine hcl]    Review of Systems   Review of Systems  Physical Exam Updated Vital Signs BP (!) 172/82 (BP Location: Right Arm)   Pulse 93   Temp 98.2 F (36.8 C) (Oral)   Resp 18   SpO2 97%  Physical Exam Vitals and nursing note reviewed.  Constitutional:      General: He is not in acute distress.    Appearance: He is well-developed. He is not diaphoretic.  HENT:     Head: Normocephalic and atraumatic.  Eyes:     General: No scleral icterus.    Conjunctiva/sclera: Conjunctivae normal.  Cardiovascular:     Rate and Rhythm: Normal rate and regular rhythm.     Heart sounds: Normal heart sounds.  Pulmonary:     Effort:  Pulmonary effort is normal. No respiratory distress.     Breath sounds: Normal breath sounds.  Abdominal:     Palpations: Abdomen is soft.     Tenderness: There is no abdominal tenderness.  Musculoskeletal:     Cervical back: Normal range of motion and neck supple.     Comments: Left index finger with severe distal laceration just inferior to the nail margin which has filleted the finger in half.  There are multiple open bone fragments.  I thoroughly irrigated with 2 L of water and removed all fragments and clot from the finger.  Skin:    General: Skin is warm and dry.  Neurological:     Mental Status: He is alert.  Psychiatric:        Behavior: Behavior normal.          ED Results / Procedures / Treatments    Labs (all labs ordered are listed, but only abnormal results are displayed) Labs Reviewed - No data to display  EKG None  Radiology DG Finger Index Left  Result Date: 03/02/2022 CLINICAL DATA:  Trauma.  Cut finger with table saw. EXAM: LEFT INDEX FINGER 2+V COMPARISON:  None Available. FINDINGS: There is diffuse comminuted trauma seen to the middle and distal phalanges of the index finger. There is a complete transverse fracture of the proximal metaphysis of the distal phalanx with displacement of the distal fracture component in the volar direction approximately 7 mm and proximally approximately 11 mm. There are multiple tiny comminuted fracture components. There is extensive comminuted fracturing of the proximal 80-90% of the middle phalanx of the index finger with approximately 4 mm dorsal displacement of the dominant distal fracture component. Multiple small cortical fragments. This fracture extends to the proximal articular surface. The PIP joint does appear to be normally aligned. IMPRESSION: 1. Markedly comminuted acute fracturing of the middle and distal phalanges of the index finger. The fractures within the middle phalanx extend to the proximal articular surface at the PIP joint which appears appropriately aligned. 2. The dominant fracture within the distal phalanx is at the proximal aspect, and the distal fracture component is displaced in the volar and proximal directions so that the dorsal aspect of the distal fracture component abuts the volar aspect of the DIP joint. Electronically Signed   By: Neita Garnet M.D.   On: 03/02/2022 14:31    Procedures .Nerve Block  Date/Time: 03/02/2022 10:10 PM  Performed by: Arthor Captain, PA-C Authorized by: Arthor Captain, PA-C   Consent:    Consent obtained:  Verbal   Consent given by:  Patient   Risks discussed:  Nerve damage and infection   Alternatives discussed:  No treatment Universal protocol:    Patient identity confirmed:   Verbally with patient Indications:    Indications:  Pain relief and procedural anesthesia Location:    Body area:  Upper extremity   Laterality:  Left Pre-procedure details:    Skin preparation:  Povidone-iodine   Preparation: Patient was prepped and draped in usual sterile fashion   Procedure details:    Block needle gauge:  25 G   Anesthetic injected:  Lidocaine 1% WITH epi   Injection procedure:  Anatomic landmarks identified, anatomic landmarks palpated and negative aspiration for blood   Paresthesia:  None Post-procedure details:    Outcome:  Anesthesia achieved   Procedure completion:  Tolerated well, no immediate complications     Medications Ordered in ED Medications  ceFAZolin (ANCEF) IVPB 1 g/50 mL premix (has no  administration in time range)    ED Course/ Medical Decision Making/ A&P Clinical Course as of 03/02/22 2046  Fri Mar 02, 2022  2045 Dry [AH]    Clinical Course User Index [AH] Arthor Captain, PA-C                           Medical Decision Making 61 year old male here with open finger fracture.  Seen and shared visit with Dr. Edwin Dada.  I blocked the patient's finger with an MCP block.  I removed the clots and thoroughly irrigated his open fracture.  Patient was given 1 g of Ancef.  I discussed the case with Dr. Dion Saucier who now has his information and has scheduled him for a surgical repair tomorrow at 10:15 AM.  Initially I was going to admit the patient for medical clearance and Obs overnight to make sure he did not have a repeat episode of hemorrhage from the wound however patient declined admission and I have given him discharge instructions after speaking with the OR charge nurse.  He understands that he will need to check in at the ER tomorrow at 7:30 AM and to be at short stay by 8 AM, n.p.o. after midnight, he may take his blood pressure medication and Tylenol with a sip of fluid in the morning prior to arrival.  Patient having no active bleeding at  time of discharge and appears appropriate for discharge at this time.  Amount and/or Complexity of Data Reviewed Labs: ordered. Radiology: ordered.  Risk Prescription drug management. Decision regarding hospitalization.          Final Clinical Impression(s) / ED Diagnoses Final diagnoses:  None    Rx / DC Orders ED Discharge Orders     None         Arthor Captain, PA-C 03/02/22 2356    Franne Forts, DO 03/08/22 2342

## 2022-03-02 NOTE — ED Notes (Signed)
PA rewrapped with coban. Pt and family concerned that he has to go home with area still bleeding. PA spoke with pt about their concerns. Area slightly bleeding through new dressing.  Gave pt materials to go home with and advised to come back or go to [facility] if concerns rise. Was advised not to take his blood thinners. Pt ambulatory with steady gait and denies dizziness. Nad upon dc

## 2022-03-02 NOTE — Discharge Instructions (Signed)
You will need to return tomorrow morning by 7:30 AM into the ER entrance to check in with registration for surgery.  Make sure that you tell the registration that you are having surgery on your finger and need to go to short stay but were told to check in in the ER.  Your surgery is scheduled for 10 AM.  You will need to be in the short stay waiting room by 8 AM. Do not eat or drink anything after midnight.  In the morning you may take your blood pressure medications and Tylenol for pain with a sip of water. Get help right away if you bleed through your bandages.  Please return to [facility] if you bleed through your bandages.

## 2022-03-02 NOTE — ED Notes (Signed)
Bleeding through new dressing. PA at bedside.

## 2022-03-02 NOTE — Progress Notes (Signed)
Called regarding table saw injury to index finger. Patient will need I&D with amputation. On brilinta. Multiple medical problems. Plan medical admission and preoperative optimization, surgery scheduled in am around 10. Full consult to follow.   Keep npo after midnight.   Teryl Lucy, MD

## 2022-03-02 NOTE — ED Notes (Addendum)
See triage notes. Bleeding oozing but controlled. Finger skin flapped over and some of finger missing.unable to see bone/muscle at this time due to clotted blood around area.  Pt calm at this time and is soaking in betadine. Radial pulses present.

## 2022-03-03 ENCOUNTER — Encounter (HOSPITAL_COMMUNITY): Payer: Self-pay | Admitting: Orthopedic Surgery

## 2022-03-03 ENCOUNTER — Other Ambulatory Visit: Payer: Self-pay

## 2022-03-03 ENCOUNTER — Inpatient Hospital Stay (HOSPITAL_COMMUNITY): Payer: Medicare PPO | Admitting: Anesthesiology

## 2022-03-03 ENCOUNTER — Encounter (HOSPITAL_COMMUNITY)
Disposition: A | Discharge status: Home / Self Care | Payer: Self-pay | Source: Ambulatory Visit | Attending: Orthopedic Surgery

## 2022-03-03 ENCOUNTER — Ambulatory Visit (HOSPITAL_COMMUNITY)
Admit: 2022-03-03 | Discharge: 2022-03-03 | Disposition: A | Discharge status: Home / Self Care | Payer: Medicare PPO | Source: Ambulatory Visit | Attending: Orthopedic Surgery | Admitting: Orthopedic Surgery

## 2022-03-03 DIAGNOSIS — I2510 Atherosclerotic heart disease of native coronary artery without angina pectoris: Secondary | ICD-10-CM | POA: Diagnosis not present

## 2022-03-03 DIAGNOSIS — S68111A Complete traumatic metacarpophalangeal amputation of left index finger, initial encounter: Secondary | ICD-10-CM | POA: Diagnosis not present

## 2022-03-03 DIAGNOSIS — I252 Old myocardial infarction: Secondary | ICD-10-CM | POA: Insufficient documentation

## 2022-03-03 DIAGNOSIS — I10 Essential (primary) hypertension: Secondary | ICD-10-CM

## 2022-03-03 DIAGNOSIS — Z89022 Acquired absence of left finger(s): Secondary | ICD-10-CM | POA: Diagnosis not present

## 2022-03-03 DIAGNOSIS — S68121A Partial traumatic metacarpophalangeal amputation of left index finger, initial encounter: Secondary | ICD-10-CM | POA: Diagnosis not present

## 2022-03-03 DIAGNOSIS — W312XXA Contact with powered woodworking and forming machines, initial encounter: Secondary | ICD-10-CM | POA: Insufficient documentation

## 2022-03-03 DIAGNOSIS — F17210 Nicotine dependence, cigarettes, uncomplicated: Secondary | ICD-10-CM | POA: Diagnosis not present

## 2022-03-03 DIAGNOSIS — I739 Peripheral vascular disease, unspecified: Secondary | ICD-10-CM | POA: Insufficient documentation

## 2022-03-03 HISTORY — DX: Other complications of anesthesia, initial encounter: T88.59XA

## 2022-03-03 HISTORY — DX: Essential (primary) hypertension: I10

## 2022-03-03 HISTORY — DX: Atherosclerotic heart disease of native coronary artery without angina pectoris: I25.10

## 2022-03-03 HISTORY — PX: I & D EXTREMITY: SHX5045

## 2022-03-03 LAB — SURGICAL PCR SCREEN
MRSA, PCR: NEGATIVE
Staphylococcus aureus: NEGATIVE

## 2022-03-03 SURGERY — IRRIGATION AND DEBRIDEMENT EXTREMITY
Anesthesia: General | Site: Finger | Laterality: Left

## 2022-03-03 MED ORDER — GLYCOPYRROLATE PF 0.2 MG/ML IJ SOSY
PREFILLED_SYRINGE | INTRAMUSCULAR | Status: DC | PRN
  Administered 2022-03-03: .2 mg via INTRAVENOUS

## 2022-03-03 MED ORDER — PHENYLEPHRINE 80 MCG/ML (10ML) SYRINGE FOR IV PUSH (FOR BLOOD PRESSURE SUPPORT)
PREFILLED_SYRINGE | INTRAVENOUS | Status: AC
  Filled 2022-03-03: qty 20

## 2022-03-03 MED ORDER — SODIUM CHLORIDE (PF) 0.9 % IJ SOLN
INTRAMUSCULAR | Status: AC
Start: 2022-03-03 — End: ?
  Filled 2022-03-03: qty 10

## 2022-03-03 MED ORDER — ROCURONIUM BROMIDE 10 MG/ML (PF) SYRINGE
PREFILLED_SYRINGE | INTRAVENOUS | Status: AC
  Filled 2022-03-03: qty 10

## 2022-03-03 MED ORDER — EPHEDRINE 5 MG/ML INJ
INTRAVENOUS | Status: AC
Start: 2022-03-03 — End: ?
  Filled 2022-03-03: qty 5

## 2022-03-03 MED ORDER — CHLORHEXIDINE GLUCONATE 4 % EX LIQD: 60.0000 mL | Freq: Once | CUTANEOUS | Status: DC

## 2022-03-03 MED ORDER — FENTANYL CITRATE (PF) 100 MCG/2ML IJ SOLN
INTRAMUSCULAR | Status: AC
  Administered 2022-03-03: 100 ug
  Filled 2022-03-03: qty 2

## 2022-03-03 MED ORDER — EPHEDRINE SULFATE-NACL 50-0.9 MG/10ML-% IV SOSY
PREFILLED_SYRINGE | INTRAVENOUS | Status: DC | PRN
  Administered 2022-03-03: 5 mg via INTRAVENOUS
  Administered 2022-03-03: 10 mg via INTRAVENOUS
  Administered 2022-03-03 (×2): 5 mg via INTRAVENOUS

## 2022-03-03 MED ORDER — MIDAZOLAM HCL 2 MG/2ML IJ SOLN
INTRAMUSCULAR | Status: DC | PRN
  Administered 2022-03-03: 1 mg via INTRAVENOUS

## 2022-03-03 MED ORDER — PROPOFOL 10 MG/ML IV BOLUS
INTRAVENOUS | Status: AC
  Filled 2022-03-03: qty 20

## 2022-03-03 MED ORDER — CARVEDILOL 3.125 MG PO TABS
ORAL_TABLET | ORAL | Status: AC
  Filled 2022-03-03: qty 2

## 2022-03-03 MED ORDER — FENTANYL CITRATE (PF) 250 MCG/5ML IJ SOLN
INTRAMUSCULAR | Status: AC
  Filled 2022-03-03: qty 5

## 2022-03-03 MED ORDER — POVIDONE-IODINE 10 % EX SWAB
2.0000 | Freq: Once | CUTANEOUS | Status: AC
  Administered 2022-03-03: 2 via TOPICAL

## 2022-03-03 MED ORDER — FENTANYL CITRATE (PF) 100 MCG/2ML IJ SOLN: 50.0000 ug | INTRAMUSCULAR | Status: DC | PRN

## 2022-03-03 MED ORDER — CARVEDILOL 6.25 MG PO TABS
6.2500 mg | ORAL_TABLET | Freq: Once | ORAL | Status: AC
  Administered 2022-03-03: 6.25 mg via ORAL
  Filled 2022-03-03: qty 1

## 2022-03-03 MED ORDER — CHLORHEXIDINE GLUCONATE 0.12 % MT SOLN
OROMUCOSAL | Status: AC
  Administered 2022-03-03: 15 mL via OROMUCOSAL
  Filled 2022-03-03: qty 15

## 2022-03-03 MED ORDER — ONDANSETRON HCL 4 MG/2ML IJ SOLN
INTRAMUSCULAR | Status: DC | PRN
  Administered 2022-03-03: 4 mg via INTRAVENOUS

## 2022-03-03 MED ORDER — LIDOCAINE 2% (20 MG/ML) 5 ML SYRINGE
INTRAMUSCULAR | Status: DC | PRN
  Administered 2022-03-03: 60 mg via INTRAVENOUS

## 2022-03-03 MED ORDER — SODIUM CHLORIDE 0.9 % IR SOLN
Status: DC | PRN
  Administered 2022-03-03: 1000 mL

## 2022-03-03 MED ORDER — PROPOFOL 10 MG/ML IV BOLUS
INTRAVENOUS | Status: DC | PRN
  Administered 2022-03-03: 160 mg via INTRAVENOUS

## 2022-03-03 MED ORDER — BUPIVACAINE HCL (PF) 0.5 % IJ SOLN
INTRAMUSCULAR | Status: DC | PRN
  Administered 2022-03-03: 10 mL

## 2022-03-03 MED ORDER — GLYCOPYRROLATE PF 0.2 MG/ML IJ SOSY
PREFILLED_SYRINGE | INTRAMUSCULAR | Status: AC
  Filled 2022-03-03: qty 1

## 2022-03-03 MED ORDER — DEXAMETHASONE SODIUM PHOSPHATE 10 MG/ML IJ SOLN
INTRAMUSCULAR | Status: DC | PRN
  Administered 2022-03-03: 5 mg via INTRAVENOUS

## 2022-03-03 MED ORDER — ACETAMINOPHEN 500 MG PO TABS
1000.0000 mg | ORAL_TABLET | Freq: Once | ORAL | Status: AC
  Administered 2022-03-03: 1000 mg via ORAL
  Filled 2022-03-03: qty 2

## 2022-03-03 MED ORDER — DEXAMETHASONE SODIUM PHOSPHATE 10 MG/ML IJ SOLN
INTRAMUSCULAR | Status: AC
  Filled 2022-03-03: qty 1

## 2022-03-03 MED ORDER — FENTANYL CITRATE (PF) 250 MCG/5ML IJ SOLN
INTRAMUSCULAR | Status: DC | PRN
  Administered 2022-03-03 (×2): 50 ug via INTRAVENOUS
  Administered 2022-03-03: 150 ug via INTRAVENOUS

## 2022-03-03 MED ORDER — LACTATED RINGERS IV SOLN: INTRAVENOUS | Status: DC

## 2022-03-03 MED ORDER — CEFAZOLIN SODIUM-DEXTROSE 2-4 GM/100ML-% IV SOLN
INTRAVENOUS | Status: AC
  Filled 2022-03-03: qty 100

## 2022-03-03 MED ORDER — CHLORHEXIDINE GLUCONATE 0.12 % MT SOLN: 15.0000 mL | Freq: Once | OROMUCOSAL | Status: AC

## 2022-03-03 MED ORDER — LIDOCAINE 2% (20 MG/ML) 5 ML SYRINGE
INTRAMUSCULAR | Status: AC
Start: 2022-03-03 — End: ?
  Filled 2022-03-03: qty 15

## 2022-03-03 MED ORDER — ORAL CARE MOUTH RINSE: 15.0000 mL | Freq: Once | OROMUCOSAL | Status: AC

## 2022-03-03 MED ORDER — BUPIVACAINE HCL (PF) 0.5 % IJ SOLN
INTRAMUSCULAR | Status: AC
Start: 2022-03-03 — End: ?
  Filled 2022-03-03: qty 30

## 2022-03-03 MED ORDER — PHENYLEPHRINE 80 MCG/ML (10ML) SYRINGE FOR IV PUSH (FOR BLOOD PRESSURE SUPPORT)
PREFILLED_SYRINGE | INTRAVENOUS | Status: DC | PRN
  Administered 2022-03-03 (×2): 240 ug via INTRAVENOUS
  Administered 2022-03-03 (×2): 160 ug via INTRAVENOUS

## 2022-03-03 MED ORDER — HYDROMORPHONE HCL 1 MG/ML IJ SOLN: 0.2500 mg | INTRAMUSCULAR | Status: DC | PRN

## 2022-03-03 MED ORDER — ONDANSETRON HCL 4 MG/2ML IJ SOLN
INTRAMUSCULAR | Status: AC
  Filled 2022-03-03: qty 2

## 2022-03-03 MED ORDER — MIDAZOLAM HCL 2 MG/2ML IJ SOLN
INTRAMUSCULAR | Status: AC
Start: 2022-03-03 — End: ?
  Filled 2022-03-03: qty 2

## 2022-03-03 MED ORDER — CEFAZOLIN SODIUM-DEXTROSE 2-4 GM/100ML-% IV SOLN
2.0000 g | INTRAVENOUS | Status: AC
  Administered 2022-03-03: 2 g via INTRAVENOUS

## 2022-03-03 SURGICAL SUPPLY — 38 items
BNDG ELASTIC 2 VLCR STRL LF (GAUZE/BANDAGES/DRESSINGS) IMPLANT
BNDG ESMARK 4X9 LF (GAUZE/BANDAGES/DRESSINGS) IMPLANT
BNDG GAUZE DERMACEA FLUFF 4 (GAUZE/BANDAGES/DRESSINGS) IMPLANT
BOOTCOVER CLEANROOM LRG (PROTECTIVE WEAR) ×2 IMPLANT
CORD BIPOLAR FORCEPS 12FT (ELECTRODE) IMPLANT
COVER SURGICAL LIGHT HANDLE (MISCELLANEOUS) ×1 IMPLANT
CUFF TOURN SGL QUICK 18X4 (TOURNIQUET CUFF) IMPLANT
DRAPE SURG 17X23 STRL (DRAPES) IMPLANT
DRSG PAD ABDOMINAL 8X10 ST (GAUZE/BANDAGES/DRESSINGS) IMPLANT
ELECT REM PT RETURN 9FT ADLT (ELECTROSURGICAL) ×1
ELECTRODE REM PT RTRN 9FT ADLT (ELECTROSURGICAL) IMPLANT
GAUZE SPONGE 4X4 12PLY STRL (GAUZE/BANDAGES/DRESSINGS) IMPLANT
GAUZE STRETCH 2X75IN STRL (MISCELLANEOUS) IMPLANT
GAUZE XEROFORM 1X8 LF (GAUZE/BANDAGES/DRESSINGS) IMPLANT
GLOVE BIOGEL PI IND STRL 7.0 (GLOVE) ×1 IMPLANT
GLOVE BIOGEL PI INDICATOR 7.0 (GLOVE) ×1
GOWN STRL REUS W/ TWL LRG LVL3 (GOWN DISPOSABLE) ×2 IMPLANT
GOWN STRL REUS W/TWL LRG LVL3 (GOWN DISPOSABLE) ×2
KIT BASIN OR (CUSTOM PROCEDURE TRAY) ×1 IMPLANT
KIT TURNOVER KIT B (KITS) ×1 IMPLANT
MANIFOLD NEPTUNE II (INSTRUMENTS) ×1 IMPLANT
NDL HYPO 25GX1X1/2 BEV (NEEDLE) IMPLANT
NEEDLE HYPO 25GX1X1/2 BEV (NEEDLE) ×1 IMPLANT
NS IRRIG 1000ML POUR BTL (IV SOLUTION) ×1 IMPLANT
PACK ORTHO EXTREMITY (CUSTOM PROCEDURE TRAY) ×1 IMPLANT
PAD ARMBOARD 7.5X6 YLW CONV (MISCELLANEOUS) ×2 IMPLANT
SET CYSTO W/LG BORE CLAMP LF (SET/KITS/TRAYS/PACK) IMPLANT
SOL PREP POV-IOD 4OZ 10% (MISCELLANEOUS) IMPLANT
SOL SCRUB PVP POV-IOD 4OZ 7.5% (MISCELLANEOUS) ×1
SOLUTION SCRB POV-IOD 4OZ 7.5% (MISCELLANEOUS) IMPLANT
SPIKE FLUID TRANSFER (MISCELLANEOUS) IMPLANT
STOCKINETTE IMPERVIOUS 9X36 MD (GAUZE/BANDAGES/DRESSINGS) ×1 IMPLANT
SUT ETHILON 3 0 PS 1 (SUTURE) IMPLANT
SYR CONTROL 10ML LL (SYRINGE) IMPLANT
TOWEL GREEN STERILE (TOWEL DISPOSABLE) ×1 IMPLANT
TUBE CONNECTING 12X1/4 (SUCTIONS) ×1 IMPLANT
UNDERPAD 30X36 HEAVY ABSORB (UNDERPADS AND DIAPERS) ×1 IMPLANT
WATER STERILE IRR 1000ML POUR (IV SOLUTION) ×1 IMPLANT

## 2022-03-03 NOTE — Op Note (Signed)
03/03/2022  1:15 PM  PATIENT:  Anthony Todd.    PRE-OPERATIVE DIAGNOSIS: Left index finger tablesaw injury with traumatic amputation  POST-OPERATIVE DIAGNOSIS:  Same  PROCEDURE: 1.  Left index finger irrigation, debridement, excisional debridement of skin, subcutaneous tissue, bone 2.  Left index finger proximal interphalangeal joint irrigation and debridement with traumatic arthrotomy 3.  Left index finger revision amputation, through the proximal interphalangeal joint  SURGEON:  Eulas Post, MD  PHYSICIAN ASSISTANT: None  ANESTHESIA:   General  PREOPERATIVE INDICATIONS:  Anthony Beevers. is a  61 y.o. male who had a left index finger traumatic amputation from a table saw injury yesterday.  He elected for urgent surgical management.  The risks benefits and alternatives were discussed with the patient preoperatively including but not limited to the risks of infection, bleeding, nerve injury, cardiopulmonary complications, the need for revision surgery, among others, and the patient was willing to proceed.  We discussed attempting to salvage the bone although it had been crushed and destroyed, and ultimately felt that given the substantial soft tissue and bony destruction that revision amputation would provide him the fastest recovery, particularly given his vascular dysfunction with peripheral artery disease that could potentially limit his ability to heal complex distal wounds.  ESTIMATED BLOOD LOSS: Minimal  OPERATIVE IMPLANTS: None  OPERATIVE FINDINGS: Traumatic destruction of the distal and middle phalanx including the nailbed, with traumatic arthrotomy of the proximal interphalangeal joint  OPERATIVE PROCEDURE: The patient was brought to the operating room and placed in the supine position.  General anesthesia was administered.  The left upper extremity was prepped and draped in usual sterile fashion.  Timeout was performed.  The previous sutures were removed, and  the wound explored.  A tourniquet was utilized.  He basically cut in a perfect coronal plane along the entirety of the nailbed, the distal phalanx, as well as the proximal phalanx to the level of the proximal interphalangeal joint.  The bony fragments had no intrinsic stability.  These were nearly falling out of the wound.  I performed an excisional debridement of the skin, subcutaneous tissue, the tendons, as well as neurovascular structures, and excised all of the bony fragments distal to the base of P2.  I then irrigated the wounds copiously with normal saline, and prepared my skin flaps developing a generous volar flap.  I repaired the volar flap covering the PIP joint after resecting the digital nerves as proximal as possible to prevent neuroma formation, and repaired the skin with nylon.  Digital block was applied and sterile gauze applied.  He was awakened and the tourniquet was released prior to closure and excellent hemostasis obtained.  He returned to the PACU in stable and satisfactory condition.  There were no complications and he tolerated the procedure well.  Anthony Lucy, MD

## 2022-03-03 NOTE — Anesthesia Procedure Notes (Addendum)
Procedure Name: LMA Insertion Date/Time: 03/03/2022 12:01 PM  Performed by: De Nurse, CRNAPre-anesthesia Checklist: Patient identified, Emergency Drugs available, Suction available and Patient being monitored Patient Re-evaluated:Patient Re-evaluated prior to induction Oxygen Delivery Method: Circle System Utilized Preoxygenation: Pre-oxygenation with 100% oxygen Induction Type: IV induction LMA: LMA inserted LMA Size: 5.0 Number of attempts: 1 Placement Confirmation: positive ETCO2 Tube secured with: Tape Dental Injury: Teeth and Oropharynx as per pre-operative assessment

## 2022-03-03 NOTE — Anesthesia Postprocedure Evaluation (Signed)
Anesthesia Post Note  Patient: Anthony Todd.  Procedure(s) Performed: IRRIGATION AND DEBRIDEMENT/AMPUTATION (Left: Finger)     Patient location during evaluation: PACU Anesthesia Type: General Level of consciousness: awake and alert Pain management: pain level controlled Vital Signs Assessment: post-procedure vital signs reviewed and stable Respiratory status: spontaneous breathing, nonlabored ventilation and respiratory function stable Cardiovascular status: blood pressure returned to baseline and stable Postop Assessment: no apparent nausea or vomiting Anesthetic complications: no   No notable events documented.  Last Vitals:  Vitals:   03/03/22 1330 03/03/22 1345  BP: 104/72 (!) 116/93  Pulse: 74 87  Resp: 11 (!) 21  Temp:  36.9 C  SpO2: 98% 94%    Last Pain:  Vitals:   03/03/22 1345  PainSc: 0-No pain                 Akiah Bauch,W. EDMOND

## 2022-03-03 NOTE — Discharge Instructions (Signed)
Keep hand elevated, keep wounds clean and dry, okay to change the bandage if it becomes saturated, or come into the office early for a dressing change if needed.

## 2022-03-03 NOTE — Anesthesia Preprocedure Evaluation (Signed)
Anesthesia Evaluation  Patient identified by MRN, date of birth, ID band Patient awake    Reviewed: Allergy & Precautions, H&P , NPO status , Patient's Chart, lab work & pertinent test results, reviewed documented beta blocker date and time   Airway Mallampati: II  TM Distance: >3 FB Neck ROM: Full    Dental no notable dental hx. (+) Edentulous Upper, Edentulous Lower, Dental Advisory Given   Pulmonary Current Smoker and Patient abstained from smoking.,    Pulmonary exam normal breath sounds clear to auscultation       Cardiovascular hypertension, Pt. on medications and Pt. on home beta blockers + CAD, + Past MI, + Cardiac Stents and + Peripheral Vascular Disease   Rhythm:Regular Rate:Normal     Neuro/Psych negative neurological ROS  negative psych ROS   GI/Hepatic negative GI ROS, Neg liver ROS,   Endo/Other  negative endocrine ROS  Renal/GU negative Renal ROS  negative genitourinary   Musculoskeletal   Abdominal   Peds  Hematology negative hematology ROS (+)   Anesthesia Other Findings   Reproductive/Obstetrics negative OB ROS                             Anesthesia Physical Anesthesia Plan  ASA: 3  Anesthesia Plan: General   Post-op Pain Management: Tylenol PO (pre-op)*   Induction: Intravenous  PONV Risk Score and Plan: 2 and Ondansetron, Dexamethasone and Midazolam  Airway Management Planned: LMA  Additional Equipment:   Intra-op Plan:   Post-operative Plan: Extubation in OR  Informed Consent: I have reviewed the patients History and Physical, chart, labs and discussed the procedure including the risks, benefits and alternatives for the proposed anesthesia with the patient or authorized representative who has indicated his/her understanding and acceptance.     Dental advisory given  Plan Discussed with: CRNA  Anesthesia Plan Comments:         Anesthesia Quick  Evaluation

## 2022-03-03 NOTE — Transfer of Care (Signed)
Immediate Anesthesia Transfer of Care Note  Patient: Anthony Todd.  Procedure(s) Performed: IRRIGATION AND DEBRIDEMENT/AMPUTATION (Left: Finger)  Patient Location: PACU  Anesthesia Type:General  Level of Consciousness: lethargic and responds to stimulation  Airway & Oxygen Therapy: Patient Spontanous Breathing and Patient connected to face mask oxygen  Post-op Assessment: Report given to RN  Post vital signs: Reviewed and stable  Last Vitals:  Vitals Value Taken Time  BP 110/74 03/03/22 1312  Temp    Pulse 75 03/03/22 1314  Resp 9 03/03/22 1315  SpO2 100 % 03/03/22 1314  Vitals shown include unvalidated device data.  Last Pain:  Vitals:   03/03/22 0816  PainSc: 10-Worst pain ever         Complications: No notable events documented.

## 2022-03-03 NOTE — H&P (Signed)
PREOPERATIVE H&P  Chief Complaint: Left hand tablesaw injury  HPI: Anthony Todd. is a 61 y.o. male who presents for preoperative history and physical with a diagnosis of left index finger traumatic amputation with open fracture. Symptoms are rated as moderate to severe, and have been worsening.  This is significantly impairing activities of daily living.  He has elected for surgical management.  He presented yesterday to [facility], and then subsequently came back to [facility] with a wound that continued to bleed.  They performed an irrigation at [facility], and suture repair, which allowed for adequate hemostasis.  He was discharged with follow-up with me today for definitive surgical management.  Past Medical History:  Diagnosis Date   AAA (abdominal aortic aneurysm) (HCC)    Back pain    Complication of anesthesia    sometimes sruggle waking up   Coronary artery disease    Hyperlipidemia    Hypertension    Myocardial infarction [facility])    PAD (peripheral artery disease) (HCC)    Stenosis of iliac artery (HCC)    Past Surgical History:  Procedure Laterality Date   ABDOMINAL AORTOGRAM W/LOWER EXTREMITY N/A 06/05/2019   Procedure: ABDOMINAL AORTOGRAM W/LOWER EXTREMITY;  Surgeon: Sherren Kerns, MD;  Location: [facility] INVASIVE CV LAB;  Service: Cardiovascular;  Laterality: N/A;   BACK SURGERY     CORONARY STENT INTERVENTION N/A 10/26/2019   Procedure: CORONARY STENT INTERVENTION;  Surgeon: Swaziland, Peter M, MD;  Location: [facility] INVASIVE CV LAB;  Service: Cardiovascular;  Laterality: N/A;   FEMORAL ARTERY - FEMORAL ARTERY BYPASS GRAFT  09/14/2019   FEMORAL-FEMORAL BYPASS GRAFT Bilateral 09/14/2019   Procedure: LEFT TO RIGHT BYPASS GRAFT FEMORAL-FEMORAL ARTERY USING HEMASHIELD GOLD GRAFT;  Surgeon: Sherren Kerns, MD;  Location: [facility] OR;  Service: Vascular;  Laterality: Bilateral;   LEFT HEART CATH AND CORONARY ANGIOGRAPHY N/A 10/26/2019   Procedure: LEFT HEART CATH AND CORONARY  ANGIOGRAPHY;  Surgeon: Swaziland, Peter M, MD;  Location: [facility] INVASIVE CV LAB;  Service: Cardiovascular;  Laterality: N/A;   PERIPHERAL VASCULAR BALLOON ANGIOPLASTY Left 06/05/2019   Procedure: PERIPHERAL VASCULAR BALLOON ANGIOPLASTY;  Surgeon: Sherren Kerns, MD;  Location: [facility] INVASIVE CV LAB;  Service: Cardiovascular;  Laterality: Left;  Iliac   TONSILLECTOMY     Social History   Socioeconomic History   Marital status: Married    Spouse name: Not on file   Number of children: Not on file   Years of education: Not on file   Highest education level: Not on file  Occupational History   Not on file  Tobacco Use   Smoking status: Every Day    Packs/day: 1.50    Years: 50.00    Total pack years: 75.00    Types: Cigarettes   Smokeless tobacco: Never  Vaping Use   Vaping Use: Former  Substance and Sexual Activity   Alcohol use: Not Currently   Drug use: Not Currently   Sexual activity: Yes  Other Topics Concern   Not on file  Social History Narrative   Not on file   Social Determinants of Health   Financial Resource Strain: Not on file  Food Insecurity: Not on file  Transportation Needs: Not on file  Physical Activity: Not on file  Stress: Not on file  Social Connections: Not on file   Family History  Problem Relation Age of Onset   Diabetes Mother    Cancer Father    Allergies  Allergen Reactions   Wound Dressing  Adhesive Hives and Other (See Comments)    Adhesive from a BUPRENORPHINE patch   Cymbalta [Duloxetine Hcl] Rash   Prior to Admission medications   Medication Sig Start Date End Date Taking? Authorizing Provider  aspirin EC 81 MG tablet Take 81 mg by mouth at bedtime.   Yes [provider]  baclofen (LIORESAL) 10 MG tablet Take 10 mg by mouth 3 (three) times daily.   Yes [provider]  carvedilol (COREG) 6.25 MG tablet Take 1 tablet (6.25 mg total) by mouth 2 (two) times daily with a meal. 10/27/19  Yes Laverda Page B, NP   cholecalciferol (VITAMIN D3) 25 MCG (1000 UNIT) tablet Take 1,000 Units by mouth daily.   Yes [provider]  fluticasone (FLONASE) 50 MCG/ACT nasal spray Place 1-2 sprays into both nostrils daily as needed for allergies or rhinitis.   Yes [provider]  furosemide (LASIX) 20 MG tablet Take 20 mg by mouth in the morning. 10/03/21  Yes [provider]  gabapentin (NEURONTIN) 300 MG capsule Take 300 mg by mouth 3 (three) times daily.   Yes [provider]  nitroGLYCERIN (NITROSTAT) 0.4 MG SL tablet Place 1 tablet under the tongue every 5 (five) minutes as needed for chest pain. Up to 3 doses. 10/20/19  Yes [provider]  olmesartan (BENICAR) 20 MG tablet Take 20 mg by mouth daily. 10/21/19  Yes [provider]  oxyCODONE-acetaminophen (PERCOCET) 10-325 MG tablet Take 1 tablet by mouth 3 (three) times daily.   Yes [provider]  rosuvastatin (CRESTOR) 20 MG tablet Take 1 tablet (20 mg total) by mouth daily. 10/28/19  Yes Arty Baumgartner, NP  ticagrelor (BRILINTA) 90 MG TABS tablet Take 1 tablet (90 mg total) by mouth 2 (two) times daily. 10/27/19  Yes Arty Baumgartner, NP  XTAMPZA ER 9 MG C12A Take 9 mg by mouth every 12 (twelve) hours.   Yes [provider]  amoxicillin-clavulanate (AUGMENTIN) 875-125 MG tablet Take 1 tablet by mouth every 12 (twelve) hours. Patient not taking: Reported on 03/02/2022 03/02/22   Roemhildt, Lorin T, PA-C     Positive ROS: All other systems have been reviewed and were otherwise negative with the exception of those mentioned in the HPI and as above.  Physical Exam: General: Alert, no acute distress Cardiovascular: No pedal edema Respiratory: No cyanosis, no use of accessory musculature GI: No organomegaly, abdomen is soft and non-tender Skin: He has a complex laceration over the left index finger. Neurologic: Absent sensation in the distal portion of the left index fingertip. Psychiatric:  Patient is competent for consent with normal mood and affect Lymphatic: No axillary or cervical lymphadenopathy  MUSCULOSKELETAL: Left index finger has an open fracture with complex exposed anatomy, involving the distal and middle phalanx  Assessment: Left index finger complex open fracture with near amputation at the proximal interphalangeal joint with multiple coexisting medical risk factors as indicated above   Plan: Plan for Procedure(s): IRRIGATION AND DEBRIDEMENT/revision amputation left index finger  The risks benefits and alternatives were discussed with the patient including but not limited to the risks of nonoperative treatment, versus surgical intervention including infection, bleeding, nerve injury,  blood clots, cardiopulmonary complications, morbidity, mortality, among others, and they were willing to proceed.  We also discussed the risks for loss of flexion, ischemia of the skin flaps, the need for further surgical intervention.  Eulas Post, MD Cell [phone]   03/03/2022 11:36 AM

## 2022-03-04 ENCOUNTER — Encounter (HOSPITAL_COMMUNITY): Payer: Self-pay | Admitting: Orthopedic Surgery

## 2022-03-07 DIAGNOSIS — S6990XA Unspecified injury of unspecified wrist, hand and finger(s), initial encounter: Secondary | ICD-10-CM | POA: Diagnosis not present

## 2022-03-08 NOTE — ED Provider Notes (Signed)
..  Laceration Repair  Date/Time: 03/08/2022 11:43 PM  Performed by: Franne Forts, DO Authorized by: Franne Forts, DO   Consent:    Consent obtained:  Verbal   Consent given by:  Patient   Risks, benefits, and alternatives were discussed: yes     Risks discussed:  Infection, pain, need for additional repair and poor cosmetic result   Alternatives discussed:  No treatment Universal protocol:    Immediately prior to procedure, a time out was called: yes     Patient identity confirmed:  Verbally with patient, arm band and provided demographic data Anesthesia:    Anesthesia method:  Local infiltration Laceration details:    Location:  Finger   Finger location:  L index finger Exploration:    Imaging outcome: foreign body not noted     Wound extent: muscle damage, tendon damage and underlying fracture   Treatment:    Area cleansed with:  Povidone-iodine and saline   Amount of cleaning:  Extensive   Irrigation solution:  Sterile saline   Irrigation method:  Syringe   Visualized foreign bodies/material removed: no   Skin repair:    Repair method:  Sutures   Suture size:  4-0 Approximation:    Approximation:  Close Repair type:    Repair type:  Simple Post-procedure details:    Dressing:  Sterile dressing   Procedure completion:  Tolerated well, no immediate complications      Franne Forts, DO 03/08/22 [id number]

## 2022-03-12 DIAGNOSIS — R065 Mouth breathing: Secondary | ICD-10-CM | POA: Diagnosis not present

## 2022-03-12 DIAGNOSIS — I739 Peripheral vascular disease, unspecified: Secondary | ICD-10-CM | POA: Diagnosis not present

## 2022-03-12 DIAGNOSIS — E870 Hyperosmolality and hypernatremia: Secondary | ICD-10-CM | POA: Diagnosis not present

## 2022-03-12 DIAGNOSIS — I25119 Atherosclerotic heart disease of native coronary artery with unspecified angina pectoris: Secondary | ICD-10-CM | POA: Diagnosis not present

## 2022-03-12 DIAGNOSIS — I714 Abdominal aortic aneurysm, without rupture, unspecified: Secondary | ICD-10-CM | POA: Diagnosis not present

## 2022-03-12 DIAGNOSIS — E538 Deficiency of other specified B group vitamins: Secondary | ICD-10-CM | POA: Diagnosis not present

## 2022-03-12 DIAGNOSIS — G8929 Other chronic pain: Secondary | ICD-10-CM | POA: Diagnosis not present

## 2022-03-12 DIAGNOSIS — I10 Essential (primary) hypertension: Secondary | ICD-10-CM | POA: Diagnosis not present

## 2022-03-12 DIAGNOSIS — K439 Ventral hernia without obstruction or gangrene: Secondary | ICD-10-CM | POA: Diagnosis not present

## 2022-03-14 NOTE — Progress Notes (Signed)
cancelled

## 2022-03-14 NOTE — Patient Instructions (Signed)
cancelled

## 2022-03-29 DIAGNOSIS — R030 Elevated blood-pressure reading, without diagnosis of hypertension: Secondary | ICD-10-CM | POA: Diagnosis not present

## 2022-03-29 DIAGNOSIS — F17200 Nicotine dependence, unspecified, uncomplicated: Secondary | ICD-10-CM | POA: Diagnosis not present

## 2022-03-29 DIAGNOSIS — R7989 Other specified abnormal findings of blood chemistry: Secondary | ICD-10-CM | POA: Diagnosis not present

## 2022-03-29 DIAGNOSIS — G894 Chronic pain syndrome: Secondary | ICD-10-CM | POA: Diagnosis not present

## 2022-03-29 DIAGNOSIS — I714 Abdominal aortic aneurysm, without rupture, unspecified: Secondary | ICD-10-CM | POA: Diagnosis not present

## 2022-03-29 DIAGNOSIS — Z6833 Body mass index (BMI) 33.0-33.9, adult: Secondary | ICD-10-CM | POA: Diagnosis not present

## 2022-03-29 DIAGNOSIS — M47816 Spondylosis without myelopathy or radiculopathy, lumbar region: Secondary | ICD-10-CM | POA: Diagnosis not present

## 2022-03-29 DIAGNOSIS — F17210 Nicotine dependence, cigarettes, uncomplicated: Secondary | ICD-10-CM | POA: Diagnosis not present

## 2022-03-29 DIAGNOSIS — M961 Postlaminectomy syndrome, not elsewhere classified: Secondary | ICD-10-CM | POA: Diagnosis not present

## 2022-04-16 DIAGNOSIS — M961 Postlaminectomy syndrome, not elsewhere classified: Secondary | ICD-10-CM | POA: Diagnosis not present

## 2022-04-16 DIAGNOSIS — M47816 Spondylosis without myelopathy or radiculopathy, lumbar region: Secondary | ICD-10-CM | POA: Diagnosis not present

## 2022-04-16 DIAGNOSIS — R030 Elevated blood-pressure reading, without diagnosis of hypertension: Secondary | ICD-10-CM | POA: Diagnosis not present

## 2022-04-16 DIAGNOSIS — F17210 Nicotine dependence, cigarettes, uncomplicated: Secondary | ICD-10-CM | POA: Diagnosis not present

## 2022-04-16 DIAGNOSIS — I714 Abdominal aortic aneurysm, without rupture, unspecified: Secondary | ICD-10-CM | POA: Diagnosis not present

## 2022-04-16 DIAGNOSIS — G894 Chronic pain syndrome: Secondary | ICD-10-CM | POA: Diagnosis not present

## 2022-04-16 DIAGNOSIS — F17200 Nicotine dependence, unspecified, uncomplicated: Secondary | ICD-10-CM | POA: Diagnosis not present

## 2022-04-16 DIAGNOSIS — R7989 Other specified abnormal findings of blood chemistry: Secondary | ICD-10-CM | POA: Diagnosis not present

## 2022-04-16 DIAGNOSIS — Z6834 Body mass index (BMI) 34.0-34.9, adult: Secondary | ICD-10-CM | POA: Diagnosis not present

## 2022-04-17 DIAGNOSIS — M545 Low back pain, unspecified: Secondary | ICD-10-CM | POA: Diagnosis not present

## 2022-04-17 DIAGNOSIS — M961 Postlaminectomy syndrome, not elsewhere classified: Secondary | ICD-10-CM | POA: Diagnosis not present

## 2022-04-17 DIAGNOSIS — M6281 Muscle weakness (generalized): Secondary | ICD-10-CM | POA: Diagnosis not present

## 2022-05-08 DIAGNOSIS — M5459 Other low back pain: Secondary | ICD-10-CM | POA: Diagnosis not present

## 2022-05-08 DIAGNOSIS — M5416 Radiculopathy, lumbar region: Secondary | ICD-10-CM | POA: Diagnosis not present

## 2022-05-15 DIAGNOSIS — M5416 Radiculopathy, lumbar region: Secondary | ICD-10-CM | POA: Diagnosis not present

## 2022-05-15 DIAGNOSIS — M5459 Other low back pain: Secondary | ICD-10-CM | POA: Diagnosis not present

## 2022-05-22 DIAGNOSIS — M545 Low back pain, unspecified: Secondary | ICD-10-CM | POA: Diagnosis not present

## 2022-05-22 DIAGNOSIS — M6281 Muscle weakness (generalized): Secondary | ICD-10-CM | POA: Diagnosis not present

## 2022-05-22 DIAGNOSIS — M961 Postlaminectomy syndrome, not elsewhere classified: Secondary | ICD-10-CM | POA: Diagnosis not present

## 2022-05-23 DIAGNOSIS — Z6834 Body mass index (BMI) 34.0-34.9, adult: Secondary | ICD-10-CM | POA: Diagnosis not present

## 2022-05-23 DIAGNOSIS — F17200 Nicotine dependence, unspecified, uncomplicated: Secondary | ICD-10-CM | POA: Diagnosis not present

## 2022-05-23 DIAGNOSIS — M961 Postlaminectomy syndrome, not elsewhere classified: Secondary | ICD-10-CM | POA: Diagnosis not present

## 2022-05-23 DIAGNOSIS — R7989 Other specified abnormal findings of blood chemistry: Secondary | ICD-10-CM | POA: Diagnosis not present

## 2022-05-23 DIAGNOSIS — M47816 Spondylosis without myelopathy or radiculopathy, lumbar region: Secondary | ICD-10-CM | POA: Diagnosis not present

## 2022-05-23 DIAGNOSIS — G894 Chronic pain syndrome: Secondary | ICD-10-CM | POA: Diagnosis not present

## 2022-05-23 DIAGNOSIS — Z79899 Other long term (current) drug therapy: Secondary | ICD-10-CM | POA: Diagnosis not present

## 2022-05-23 DIAGNOSIS — F17210 Nicotine dependence, cigarettes, uncomplicated: Secondary | ICD-10-CM | POA: Diagnosis not present

## 2022-05-23 DIAGNOSIS — R030 Elevated blood-pressure reading, without diagnosis of hypertension: Secondary | ICD-10-CM | POA: Diagnosis not present

## 2022-05-29 DIAGNOSIS — M545 Low back pain, unspecified: Secondary | ICD-10-CM | POA: Diagnosis not present

## 2022-05-29 DIAGNOSIS — M961 Postlaminectomy syndrome, not elsewhere classified: Secondary | ICD-10-CM | POA: Diagnosis not present

## 2022-05-29 DIAGNOSIS — M6281 Muscle weakness (generalized): Secondary | ICD-10-CM | POA: Diagnosis not present

## 2022-06-04 ENCOUNTER — Ambulatory Visit: Payer: Medicare PPO | Attending: Internal Medicine | Admitting: Internal Medicine

## 2022-06-04 ENCOUNTER — Encounter: Payer: Self-pay | Admitting: Internal Medicine

## 2022-06-04 VITALS — BP 154/88 | HR 84 | Ht 69.0 in | Wt 230.6 lb

## 2022-06-04 DIAGNOSIS — I2510 Atherosclerotic heart disease of native coronary artery without angina pectoris: Secondary | ICD-10-CM | POA: Insufficient documentation

## 2022-06-04 DIAGNOSIS — E782 Mixed hyperlipidemia: Secondary | ICD-10-CM | POA: Diagnosis not present

## 2022-06-04 NOTE — Patient Instructions (Signed)
Medication Instructions:   Stop Taking Brilinta  Stop Taking Lasix   *If you need a refill on your cardiac medications before your next appointment, please call your pharmacy*   Lab Work: Your physician recommends that you return for lab work in: Tomorrow   If you have labs (blood work) drawn today and your tests are completely normal, you will receive your results only by: MyChart Message (if you have MyChart) OR A paper copy in the mail If you have any lab test that is abnormal or we need to change your treatment, we will call you to review the results.   Testing/Procedures: NONE    Follow-Up: At [facility], you and your health needs are our priority.  As part of our continuing mission to provide you with exceptional heart care, we have created designated Provider Care Teams.  These Care Teams include your primary Cardiologist (physician) and Advanced Practice Providers (APPs -  Physician Assistants and Nurse Practitioners) who all work together to provide you with the care you need, when you need it.  We recommend signing up for the patient portal called "MyChart".  Sign up information is provided on this After Visit Summary.  MyChart is used to connect with patients for Virtual Visits (Telemedicine).  Patients are able to view lab/test results, encounter notes, upcoming appointments, etc.  Non-urgent messages can be sent to your provider as well.   To learn more about what you can do with MyChart, go to [email or web address].    Your next appointment:   1 year(s)  The format for your next appointment:   In Person  Provider:   Luane School, MD    Other Instructions Thank you for choosing [facility]!    Important Information About Sugar

## 2022-06-04 NOTE — Progress Notes (Addendum)
Cardiology Office Note  Date: 06/04/2022   ID: Anthony Todd., DOB 09-12-1960, MRN [id number]  PCP:  Benita Stabile, MD  Cardiologist:  None Electrophysiologist:  None   Reason for Office Visit: CAD follow-up   History of Present Illness: Anthony Todd. is a 61 y.o. male known to have CAD manifested by NSTEMI in 10/2019 s/p RCA PCI with residual nonobstructive CAD and RPL/LAD/LCx and normal LVEF, PAD s/p R to L femorofemoral bypass in 2021, HTN, HLD, nicotine abuse, AAA (monitored by vascular surgery) presented to cardiology clinic for follow-up visit.  Patient denied any angina, DOE, dizziness, lightheadedness, claudication, palpitations, syncope and LE swelling. He continues to smoke cigarettes and does not have any plan to quit smoking. He does not check blood pressure at home and does not have any plan to check BP at home. He has been compliant with his medications and has no side effects. He continues to do Brilinta and is not aware that he has to stop taking it.  Patient was also taken off amlodipine in the past due to leg swelling.  Instead, he was prescribed Lasix 20 mg which she stopped taking recently.  Past Medical History:  Diagnosis Date   AAA (abdominal aortic aneurysm) (HCC)    Back pain    Complication of anesthesia    sometimes sruggle waking up   Coronary artery disease    Hyperlipidemia    Hypertension    Myocardial infarction [facility])    PAD (peripheral artery disease) (HCC)    Stenosis of iliac artery (HCC)     Past Surgical History:  Procedure Laterality Date   ABDOMINAL AORTOGRAM W/LOWER EXTREMITY N/A 06/05/2019   Procedure: ABDOMINAL AORTOGRAM W/LOWER EXTREMITY;  Surgeon: Sherren Kerns, MD;  Location: [facility] INVASIVE CV LAB;  Service: Cardiovascular;  Laterality: N/A;   BACK SURGERY     CORONARY STENT INTERVENTION N/A 10/26/2019   Procedure: CORONARY STENT INTERVENTION;  Surgeon: Swaziland, Peter M, MD;  Location: [facility] INVASIVE CV LAB;  Service:  Cardiovascular;  Laterality: N/A;   FEMORAL ARTERY - FEMORAL ARTERY BYPASS GRAFT  09/14/2019   FEMORAL-FEMORAL BYPASS GRAFT Bilateral 09/14/2019   Procedure: LEFT TO RIGHT BYPASS GRAFT FEMORAL-FEMORAL ARTERY USING HEMASHIELD GOLD GRAFT;  Surgeon: Sherren Kerns, MD;  Location: [facility] OR;  Service: Vascular;  Laterality: Bilateral;   I & D EXTREMITY Left 03/03/2022   Procedure: IRRIGATION AND DEBRIDEMENT/AMPUTATION;  Surgeon: Teryl Lucy, MD;  Location: [facility] OR;  Service: Orthopedics;  Laterality: Left;   LEFT HEART CATH AND CORONARY ANGIOGRAPHY N/A 10/26/2019   Procedure: LEFT HEART CATH AND CORONARY ANGIOGRAPHY;  Surgeon: Swaziland, Peter M, MD;  Location: [facility] INVASIVE CV LAB;  Service: Cardiovascular;  Laterality: N/A;   PERIPHERAL VASCULAR BALLOON ANGIOPLASTY Left 06/05/2019   Procedure: PERIPHERAL VASCULAR BALLOON ANGIOPLASTY;  Surgeon: Sherren Kerns, MD;  Location: [facility] INVASIVE CV LAB;  Service: Cardiovascular;  Laterality: Left;  Iliac   TONSILLECTOMY      Current Outpatient Medications  Medication Sig Dispense Refill   aspirin EC 81 MG tablet Take 81 mg by mouth at bedtime.     baclofen (LIORESAL) 10 MG tablet Take 10 mg by mouth 3 (three) times daily.     carvedilol (COREG) 6.25 MG tablet Take 1 tablet (6.25 mg total) by mouth 2 (two) times daily with a meal. 180 tablet 0   cholecalciferol (VITAMIN D3) 25 MCG (1000 UNIT) tablet Take 1,000 Units by mouth daily.     fluticasone (FLONASE) 50 MCG/ACT  nasal spray Place 1-2 sprays into both nostrils daily as needed for allergies or rhinitis.     gabapentin (NEURONTIN) 300 MG capsule Take 300 mg by mouth 3 (three) times daily.     nitroGLYCERIN (NITROSTAT) 0.4 MG SL tablet Place 1 tablet under the tongue every 5 (five) minutes as needed for chest pain. Up to 3 doses.     olmesartan (BENICAR) 20 MG tablet Take 20 mg by mouth daily.     oxyCODONE-acetaminophen (PERCOCET) 10-325 MG tablet Take 1 tablet by mouth 3 (three) times daily.     rosuvastatin  (CRESTOR) 20 MG tablet Take 1 tablet (20 mg total) by mouth daily. 90 tablet 0   XTAMPZA ER 9 MG C12A Take 9 mg by mouth every 12 (twelve) hours.     No current facility-administered medications for this visit.   Allergies:  Wound dressing adhesive and Cymbalta [duloxetine hcl]   Social History: The patient  reports that he has been smoking cigarettes. He has a 75.00 pack-year smoking history. He has never used smokeless tobacco. He reports that he does not currently use alcohol. He reports that he does not currently use drugs.   Family History: The patient's family history includes Cancer in his father; Diabetes in his mother.   ROS:  Please see the history of present illness. Otherwise, complete review of systems is positive for none.  All other systems are reviewed and negative.   Physical Exam: VS:  BP (!) 154/88   Pulse 84   Ht 5\' 9"  (1.753 m)   Wt 230 lb 9.6 oz (104.6 kg)   SpO2 97%   BMI 34.05 kg/m , BMI Body mass index is 34.05 kg/m.  Wt Readings from Last 3 Encounters:  06/04/22 230 lb 9.6 oz (104.6 kg)  03/03/22 232 lb (105.2 kg)  03/02/22 232 lb (105.2 kg)    General: Patient appears comfortable at rest. HEENT: Conjunctiva and lids normal, oropharynx clear with moist mucosa. Neck: Supple, no elevated JVP or carotid bruits, no thyromegaly. Lungs: Clear to auscultation, nonlabored breathing at rest. Cardiac: Regular rate and rhythm, no S3 or significant systolic murmur, no pericardial rub. Abdomen: Soft, nontender, no hepatomegaly, bowel sounds present, no guarding or rebound. Extremities: No pitting edema, distal pulses 2+. Skin: Warm and dry. Musculoskeletal: No kyphosis. Neuropsychiatric: Alert and oriented x3, affect grossly appropriate.  ECG: NSR   Recent Labwork: 03/02/2022: ALT 33; AST 16; BUN 9; Creatinine, Ser 0.97; Hemoglobin 14.6; Platelets 172; Potassium 3.9; Sodium 141     Component Value Date/Time   CHOL 154 09/16/2019 0408   TRIG 204 (H) 09/16/2019  0408   HDL 21 (L) 09/16/2019 0408   CHOLHDL 7.3 09/16/2019 0408   VLDL 41 (H) 09/16/2019 0408   LDLCALC 92 09/16/2019 0408    Other Studies Reviewed Today: Echo from 2021 LVEF 60 to 65% No valve abnormalities  NM stress test in 2021 No trends of ischemia  Assessment and Plan: Patient is a 61 year old M known to have CAD manifested by NSTEMI in 10/2019 s/p RCA PCI with residual nonobstructive CAD and RPL/LAD/LCx and normal LVEF, PAD s/p R to L femorofemoral bypass in 2021, HTN, HLD, nicotine abuse, AAA (monitored by vascular surgery) presented to cardiology clinic for follow-up visit.  #CAD manifested by NSTEMI in 10/2019 s/p RCA PCI with residual nonobstructive CAD and RPL/LAD/LCx and normal LVEF, currently angina free -Continue aspirin 81 mg once daily.Stop Brilinta. -Continue rosuvastatin 20 mg nightly  #PAD s/p R to L femorofemoral bypass in  2021, claudication free -Continue aspirin 81 mg once daily and rosuvastatin 20 mg nightly -Patient follows up with vascular surgery for PAD  # HLD, not at goal -Continue rosuvastatin 20 mg nightly.  Obtain lipid panel.  If LDL goal continues to be more than 55, he will benefit from addition of Zetia 10 mg once daily.  # HTN, partially controlled -Continue carvedilol 6.25 twice daily -Continue olmesartan 20 mg once daily -Instructed patient to check his blood pressure at home every day to which the patient replied that he does not care about his HTN anymore after he had his MI in 10/2019 and that he does not want to check his BP at home.  He voiced understanding of the HTN risk like further MI, worsening of PAD, stroke etc.  # AAA 3.2 cm -Follow-up with vascular surgery  # Nicotine abuse -Patient does not want to quit smoking at all.  He understands the risk of smoking including stroke, MI, worsening PAD.  I have spent a total of 35 minutes with patient reviewing chart, EKGs, labs and examining patient as well as establishing an assessment  and plan that was discussed with the patient.  > 50% of time was spent in direct patient care.       Medication Adjustments/Labs and Tests Ordered: Current medicines are reviewed at length with the patient today.  Concerns regarding medicines are outlined above.   Tests Ordered: Orders Placed This Encounter  Procedures   Lipid panel    Medication Changes: No orders of the defined types were placed in this encounter.   Disposition:  Follow up  1 year  Signed, Jniyah Dantuono Verne Spurr, MD, 06/04/2022 0400PM [facility] [address] [place], [place] [address]

## 2022-06-19 DIAGNOSIS — M545 Low back pain, unspecified: Secondary | ICD-10-CM | POA: Diagnosis not present

## 2022-06-19 DIAGNOSIS — M961 Postlaminectomy syndrome, not elsewhere classified: Secondary | ICD-10-CM | POA: Diagnosis not present

## 2022-06-19 DIAGNOSIS — M6281 Muscle weakness (generalized): Secondary | ICD-10-CM | POA: Diagnosis not present

## 2022-06-22 DIAGNOSIS — R7989 Other specified abnormal findings of blood chemistry: Secondary | ICD-10-CM | POA: Diagnosis not present

## 2022-06-22 DIAGNOSIS — Z79899 Other long term (current) drug therapy: Secondary | ICD-10-CM | POA: Diagnosis not present

## 2022-06-22 DIAGNOSIS — F17210 Nicotine dependence, cigarettes, uncomplicated: Secondary | ICD-10-CM | POA: Diagnosis not present

## 2022-06-22 DIAGNOSIS — M961 Postlaminectomy syndrome, not elsewhere classified: Secondary | ICD-10-CM | POA: Diagnosis not present

## 2022-06-22 DIAGNOSIS — G894 Chronic pain syndrome: Secondary | ICD-10-CM | POA: Diagnosis not present

## 2022-06-22 DIAGNOSIS — F17200 Nicotine dependence, unspecified, uncomplicated: Secondary | ICD-10-CM | POA: Diagnosis not present

## 2022-06-22 DIAGNOSIS — M47816 Spondylosis without myelopathy or radiculopathy, lumbar region: Secondary | ICD-10-CM | POA: Diagnosis not present

## 2022-06-22 DIAGNOSIS — R030 Elevated blood-pressure reading, without diagnosis of hypertension: Secondary | ICD-10-CM | POA: Diagnosis not present

## 2022-06-22 DIAGNOSIS — Z6833 Body mass index (BMI) 33.0-33.9, adult: Secondary | ICD-10-CM | POA: Diagnosis not present

## 2022-06-22 DIAGNOSIS — I714 Abdominal aortic aneurysm, without rupture, unspecified: Secondary | ICD-10-CM | POA: Diagnosis not present

## 2022-07-23 DIAGNOSIS — M961 Postlaminectomy syndrome, not elsewhere classified: Secondary | ICD-10-CM | POA: Diagnosis not present

## 2022-07-23 DIAGNOSIS — Z6834 Body mass index (BMI) 34.0-34.9, adult: Secondary | ICD-10-CM | POA: Diagnosis not present

## 2022-07-23 DIAGNOSIS — R030 Elevated blood-pressure reading, without diagnosis of hypertension: Secondary | ICD-10-CM | POA: Diagnosis not present

## 2022-07-23 DIAGNOSIS — M47816 Spondylosis without myelopathy or radiculopathy, lumbar region: Secondary | ICD-10-CM | POA: Diagnosis not present

## 2022-07-23 DIAGNOSIS — F17210 Nicotine dependence, cigarettes, uncomplicated: Secondary | ICD-10-CM | POA: Diagnosis not present

## 2022-07-23 DIAGNOSIS — G894 Chronic pain syndrome: Secondary | ICD-10-CM | POA: Diagnosis not present

## 2022-07-23 DIAGNOSIS — R7989 Other specified abnormal findings of blood chemistry: Secondary | ICD-10-CM | POA: Diagnosis not present

## 2022-07-23 DIAGNOSIS — Z79899 Other long term (current) drug therapy: Secondary | ICD-10-CM | POA: Diagnosis not present

## 2022-07-23 DIAGNOSIS — F17200 Nicotine dependence, unspecified, uncomplicated: Secondary | ICD-10-CM | POA: Diagnosis not present

## 2022-08-07 DIAGNOSIS — M5416 Radiculopathy, lumbar region: Secondary | ICD-10-CM | POA: Diagnosis not present

## 2022-08-07 DIAGNOSIS — M545 Low back pain, unspecified: Secondary | ICD-10-CM | POA: Diagnosis not present

## 2022-08-07 DIAGNOSIS — M6281 Muscle weakness (generalized): Secondary | ICD-10-CM | POA: Diagnosis not present

## 2022-08-07 DIAGNOSIS — M961 Postlaminectomy syndrome, not elsewhere classified: Secondary | ICD-10-CM | POA: Diagnosis not present

## 2022-08-20 DIAGNOSIS — M47816 Spondylosis without myelopathy or radiculopathy, lumbar region: Secondary | ICD-10-CM | POA: Diagnosis not present

## 2022-08-20 DIAGNOSIS — M961 Postlaminectomy syndrome, not elsewhere classified: Secondary | ICD-10-CM | POA: Diagnosis not present

## 2022-08-20 DIAGNOSIS — R030 Elevated blood-pressure reading, without diagnosis of hypertension: Secondary | ICD-10-CM | POA: Diagnosis not present

## 2022-08-20 DIAGNOSIS — R7989 Other specified abnormal findings of blood chemistry: Secondary | ICD-10-CM | POA: Diagnosis not present

## 2022-08-20 DIAGNOSIS — Z6833 Body mass index (BMI) 33.0-33.9, adult: Secondary | ICD-10-CM | POA: Diagnosis not present

## 2022-08-20 DIAGNOSIS — F17210 Nicotine dependence, cigarettes, uncomplicated: Secondary | ICD-10-CM | POA: Diagnosis not present

## 2022-08-20 DIAGNOSIS — Z79899 Other long term (current) drug therapy: Secondary | ICD-10-CM | POA: Diagnosis not present

## 2022-08-20 DIAGNOSIS — G894 Chronic pain syndrome: Secondary | ICD-10-CM | POA: Diagnosis not present

## 2022-08-20 DIAGNOSIS — F17200 Nicotine dependence, unspecified, uncomplicated: Secondary | ICD-10-CM | POA: Diagnosis not present

## 2022-08-21 DIAGNOSIS — M6281 Muscle weakness (generalized): Secondary | ICD-10-CM | POA: Diagnosis not present

## 2022-08-21 DIAGNOSIS — M961 Postlaminectomy syndrome, not elsewhere classified: Secondary | ICD-10-CM | POA: Diagnosis not present

## 2022-08-21 DIAGNOSIS — M545 Low back pain, unspecified: Secondary | ICD-10-CM | POA: Diagnosis not present

## 2022-08-21 DIAGNOSIS — M5416 Radiculopathy, lumbar region: Secondary | ICD-10-CM | POA: Diagnosis not present

## 2022-08-23 DIAGNOSIS — Z79899 Other long term (current) drug therapy: Secondary | ICD-10-CM | POA: Diagnosis not present

## 2022-08-28 DIAGNOSIS — M5416 Radiculopathy, lumbar region: Secondary | ICD-10-CM | POA: Diagnosis not present

## 2022-08-28 DIAGNOSIS — M545 Low back pain, unspecified: Secondary | ICD-10-CM | POA: Diagnosis not present

## 2022-08-28 DIAGNOSIS — M961 Postlaminectomy syndrome, not elsewhere classified: Secondary | ICD-10-CM | POA: Diagnosis not present

## 2022-08-28 DIAGNOSIS — M6281 Muscle weakness (generalized): Secondary | ICD-10-CM | POA: Diagnosis not present

## 2022-09-04 DIAGNOSIS — M6281 Muscle weakness (generalized): Secondary | ICD-10-CM | POA: Diagnosis not present

## 2022-09-04 DIAGNOSIS — M961 Postlaminectomy syndrome, not elsewhere classified: Secondary | ICD-10-CM | POA: Diagnosis not present

## 2022-09-04 DIAGNOSIS — M5416 Radiculopathy, lumbar region: Secondary | ICD-10-CM | POA: Diagnosis not present

## 2022-09-04 DIAGNOSIS — M545 Low back pain, unspecified: Secondary | ICD-10-CM | POA: Diagnosis not present

## 2022-09-11 DIAGNOSIS — M5416 Radiculopathy, lumbar region: Secondary | ICD-10-CM | POA: Diagnosis not present

## 2022-09-11 DIAGNOSIS — M6281 Muscle weakness (generalized): Secondary | ICD-10-CM | POA: Diagnosis not present

## 2022-09-11 DIAGNOSIS — M961 Postlaminectomy syndrome, not elsewhere classified: Secondary | ICD-10-CM | POA: Diagnosis not present

## 2022-09-11 DIAGNOSIS — M545 Low back pain, unspecified: Secondary | ICD-10-CM | POA: Diagnosis not present

## 2022-09-17 DIAGNOSIS — F17210 Nicotine dependence, cigarettes, uncomplicated: Secondary | ICD-10-CM | POA: Diagnosis not present

## 2022-09-17 DIAGNOSIS — Z79899 Other long term (current) drug therapy: Secondary | ICD-10-CM | POA: Diagnosis not present

## 2022-09-17 DIAGNOSIS — G894 Chronic pain syndrome: Secondary | ICD-10-CM | POA: Diagnosis not present

## 2022-09-17 DIAGNOSIS — Z6834 Body mass index (BMI) 34.0-34.9, adult: Secondary | ICD-10-CM | POA: Diagnosis not present

## 2022-09-17 DIAGNOSIS — R7989 Other specified abnormal findings of blood chemistry: Secondary | ICD-10-CM | POA: Diagnosis not present

## 2022-09-17 DIAGNOSIS — R030 Elevated blood-pressure reading, without diagnosis of hypertension: Secondary | ICD-10-CM | POA: Diagnosis not present

## 2022-09-17 DIAGNOSIS — M47816 Spondylosis without myelopathy or radiculopathy, lumbar region: Secondary | ICD-10-CM | POA: Diagnosis not present

## 2022-09-17 DIAGNOSIS — F17200 Nicotine dependence, unspecified, uncomplicated: Secondary | ICD-10-CM | POA: Diagnosis not present

## 2022-09-17 DIAGNOSIS — M961 Postlaminectomy syndrome, not elsewhere classified: Secondary | ICD-10-CM | POA: Diagnosis not present

## 2022-09-19 DIAGNOSIS — I10 Essential (primary) hypertension: Secondary | ICD-10-CM | POA: Diagnosis not present

## 2022-09-19 DIAGNOSIS — E782 Mixed hyperlipidemia: Secondary | ICD-10-CM | POA: Diagnosis not present

## 2022-09-19 DIAGNOSIS — R7303 Prediabetes: Secondary | ICD-10-CM | POA: Diagnosis not present

## 2022-09-25 DIAGNOSIS — M5416 Radiculopathy, lumbar region: Secondary | ICD-10-CM | POA: Diagnosis not present

## 2022-09-25 DIAGNOSIS — M6281 Muscle weakness (generalized): Secondary | ICD-10-CM | POA: Diagnosis not present

## 2022-09-25 DIAGNOSIS — M545 Low back pain, unspecified: Secondary | ICD-10-CM | POA: Diagnosis not present

## 2022-09-25 DIAGNOSIS — M961 Postlaminectomy syndrome, not elsewhere classified: Secondary | ICD-10-CM | POA: Diagnosis not present

## 2022-09-26 DIAGNOSIS — K439 Ventral hernia without obstruction or gangrene: Secondary | ICD-10-CM | POA: Diagnosis not present

## 2022-09-26 DIAGNOSIS — I2510 Atherosclerotic heart disease of native coronary artery without angina pectoris: Secondary | ICD-10-CM | POA: Diagnosis not present

## 2022-09-26 DIAGNOSIS — I714 Abdominal aortic aneurysm, without rupture, unspecified: Secondary | ICD-10-CM | POA: Diagnosis not present

## 2022-09-26 DIAGNOSIS — E870 Hyperosmolality and hypernatremia: Secondary | ICD-10-CM | POA: Diagnosis not present

## 2022-09-26 DIAGNOSIS — I25119 Atherosclerotic heart disease of native coronary artery with unspecified angina pectoris: Secondary | ICD-10-CM | POA: Diagnosis not present

## 2022-09-26 DIAGNOSIS — Z0001 Encounter for general adult medical examination with abnormal findings: Secondary | ICD-10-CM | POA: Diagnosis not present

## 2022-09-26 DIAGNOSIS — G8929 Other chronic pain: Secondary | ICD-10-CM | POA: Diagnosis not present

## 2022-09-26 DIAGNOSIS — I10 Essential (primary) hypertension: Secondary | ICD-10-CM | POA: Diagnosis not present

## 2022-09-26 DIAGNOSIS — I739 Peripheral vascular disease, unspecified: Secondary | ICD-10-CM | POA: Diagnosis not present

## 2022-10-02 DIAGNOSIS — M5416 Radiculopathy, lumbar region: Secondary | ICD-10-CM | POA: Diagnosis not present

## 2022-10-02 DIAGNOSIS — M545 Low back pain, unspecified: Secondary | ICD-10-CM | POA: Diagnosis not present

## 2022-10-02 DIAGNOSIS — M961 Postlaminectomy syndrome, not elsewhere classified: Secondary | ICD-10-CM | POA: Diagnosis not present

## 2022-10-02 DIAGNOSIS — M6281 Muscle weakness (generalized): Secondary | ICD-10-CM | POA: Diagnosis not present

## 2022-10-10 DIAGNOSIS — M47812 Spondylosis without myelopathy or radiculopathy, cervical region: Secondary | ICD-10-CM | POA: Diagnosis not present

## 2022-10-10 DIAGNOSIS — M47816 Spondylosis without myelopathy or radiculopathy, lumbar region: Secondary | ICD-10-CM | POA: Diagnosis not present

## 2022-10-10 DIAGNOSIS — S233XXA Sprain of ligaments of thoracic spine, initial encounter: Secondary | ICD-10-CM | POA: Diagnosis not present

## 2022-10-10 DIAGNOSIS — M9901 Segmental and somatic dysfunction of cervical region: Secondary | ICD-10-CM | POA: Diagnosis not present

## 2022-10-10 DIAGNOSIS — M9903 Segmental and somatic dysfunction of lumbar region: Secondary | ICD-10-CM | POA: Diagnosis not present

## 2022-10-10 DIAGNOSIS — M9902 Segmental and somatic dysfunction of thoracic region: Secondary | ICD-10-CM | POA: Diagnosis not present

## 2022-10-15 DIAGNOSIS — F17210 Nicotine dependence, cigarettes, uncomplicated: Secondary | ICD-10-CM | POA: Diagnosis not present

## 2022-10-15 DIAGNOSIS — G894 Chronic pain syndrome: Secondary | ICD-10-CM | POA: Diagnosis not present

## 2022-10-15 DIAGNOSIS — Z79899 Other long term (current) drug therapy: Secondary | ICD-10-CM | POA: Diagnosis not present

## 2022-10-15 DIAGNOSIS — Z6833 Body mass index (BMI) 33.0-33.9, adult: Secondary | ICD-10-CM | POA: Diagnosis not present

## 2022-10-15 DIAGNOSIS — M961 Postlaminectomy syndrome, not elsewhere classified: Secondary | ICD-10-CM | POA: Diagnosis not present

## 2022-10-15 DIAGNOSIS — F17200 Nicotine dependence, unspecified, uncomplicated: Secondary | ICD-10-CM | POA: Diagnosis not present

## 2022-10-15 DIAGNOSIS — R7989 Other specified abnormal findings of blood chemistry: Secondary | ICD-10-CM | POA: Diagnosis not present

## 2022-10-15 DIAGNOSIS — R030 Elevated blood-pressure reading, without diagnosis of hypertension: Secondary | ICD-10-CM | POA: Diagnosis not present

## 2022-10-15 DIAGNOSIS — M47816 Spondylosis without myelopathy or radiculopathy, lumbar region: Secondary | ICD-10-CM | POA: Diagnosis not present

## 2022-10-16 DIAGNOSIS — M9901 Segmental and somatic dysfunction of cervical region: Secondary | ICD-10-CM | POA: Diagnosis not present

## 2022-10-16 DIAGNOSIS — M47816 Spondylosis without myelopathy or radiculopathy, lumbar region: Secondary | ICD-10-CM | POA: Diagnosis not present

## 2022-10-16 DIAGNOSIS — S233XXA Sprain of ligaments of thoracic spine, initial encounter: Secondary | ICD-10-CM | POA: Diagnosis not present

## 2022-10-16 DIAGNOSIS — M9903 Segmental and somatic dysfunction of lumbar region: Secondary | ICD-10-CM | POA: Diagnosis not present

## 2022-10-16 DIAGNOSIS — M47812 Spondylosis without myelopathy or radiculopathy, cervical region: Secondary | ICD-10-CM | POA: Diagnosis not present

## 2022-10-16 DIAGNOSIS — M9902 Segmental and somatic dysfunction of thoracic region: Secondary | ICD-10-CM | POA: Diagnosis not present

## 2022-10-17 DIAGNOSIS — Z79899 Other long term (current) drug therapy: Secondary | ICD-10-CM | POA: Diagnosis not present

## 2022-10-18 DIAGNOSIS — M9901 Segmental and somatic dysfunction of cervical region: Secondary | ICD-10-CM | POA: Diagnosis not present

## 2022-10-18 DIAGNOSIS — M9902 Segmental and somatic dysfunction of thoracic region: Secondary | ICD-10-CM | POA: Diagnosis not present

## 2022-10-18 DIAGNOSIS — M47812 Spondylosis without myelopathy or radiculopathy, cervical region: Secondary | ICD-10-CM | POA: Diagnosis not present

## 2022-10-18 DIAGNOSIS — M9903 Segmental and somatic dysfunction of lumbar region: Secondary | ICD-10-CM | POA: Diagnosis not present

## 2022-10-18 DIAGNOSIS — M47816 Spondylosis without myelopathy or radiculopathy, lumbar region: Secondary | ICD-10-CM | POA: Diagnosis not present

## 2022-10-18 DIAGNOSIS — S233XXA Sprain of ligaments of thoracic spine, initial encounter: Secondary | ICD-10-CM | POA: Diagnosis not present

## 2022-10-23 DIAGNOSIS — M9903 Segmental and somatic dysfunction of lumbar region: Secondary | ICD-10-CM | POA: Diagnosis not present

## 2022-10-23 DIAGNOSIS — S233XXA Sprain of ligaments of thoracic spine, initial encounter: Secondary | ICD-10-CM | POA: Diagnosis not present

## 2022-10-23 DIAGNOSIS — M9902 Segmental and somatic dysfunction of thoracic region: Secondary | ICD-10-CM | POA: Diagnosis not present

## 2022-10-23 DIAGNOSIS — M47812 Spondylosis without myelopathy or radiculopathy, cervical region: Secondary | ICD-10-CM | POA: Diagnosis not present

## 2022-10-23 DIAGNOSIS — M9901 Segmental and somatic dysfunction of cervical region: Secondary | ICD-10-CM | POA: Diagnosis not present

## 2022-10-23 DIAGNOSIS — M47816 Spondylosis without myelopathy or radiculopathy, lumbar region: Secondary | ICD-10-CM | POA: Diagnosis not present

## 2022-10-25 DIAGNOSIS — M9903 Segmental and somatic dysfunction of lumbar region: Secondary | ICD-10-CM | POA: Diagnosis not present

## 2022-10-25 DIAGNOSIS — S233XXA Sprain of ligaments of thoracic spine, initial encounter: Secondary | ICD-10-CM | POA: Diagnosis not present

## 2022-10-25 DIAGNOSIS — M9902 Segmental and somatic dysfunction of thoracic region: Secondary | ICD-10-CM | POA: Diagnosis not present

## 2022-10-25 DIAGNOSIS — M9901 Segmental and somatic dysfunction of cervical region: Secondary | ICD-10-CM | POA: Diagnosis not present

## 2022-10-25 DIAGNOSIS — M47812 Spondylosis without myelopathy or radiculopathy, cervical region: Secondary | ICD-10-CM | POA: Diagnosis not present

## 2022-10-25 DIAGNOSIS — M47816 Spondylosis without myelopathy or radiculopathy, lumbar region: Secondary | ICD-10-CM | POA: Diagnosis not present

## 2022-10-30 DIAGNOSIS — M47812 Spondylosis without myelopathy or radiculopathy, cervical region: Secondary | ICD-10-CM | POA: Diagnosis not present

## 2022-10-30 DIAGNOSIS — S233XXA Sprain of ligaments of thoracic spine, initial encounter: Secondary | ICD-10-CM | POA: Diagnosis not present

## 2022-10-30 DIAGNOSIS — M47816 Spondylosis without myelopathy or radiculopathy, lumbar region: Secondary | ICD-10-CM | POA: Diagnosis not present

## 2022-10-30 DIAGNOSIS — M9901 Segmental and somatic dysfunction of cervical region: Secondary | ICD-10-CM | POA: Diagnosis not present

## 2022-10-30 DIAGNOSIS — M9902 Segmental and somatic dysfunction of thoracic region: Secondary | ICD-10-CM | POA: Diagnosis not present

## 2022-10-30 DIAGNOSIS — M9903 Segmental and somatic dysfunction of lumbar region: Secondary | ICD-10-CM | POA: Diagnosis not present

## 2022-11-01 DIAGNOSIS — M47816 Spondylosis without myelopathy or radiculopathy, lumbar region: Secondary | ICD-10-CM | POA: Diagnosis not present

## 2022-11-01 DIAGNOSIS — M47812 Spondylosis without myelopathy or radiculopathy, cervical region: Secondary | ICD-10-CM | POA: Diagnosis not present

## 2022-11-01 DIAGNOSIS — M9903 Segmental and somatic dysfunction of lumbar region: Secondary | ICD-10-CM | POA: Diagnosis not present

## 2022-11-01 DIAGNOSIS — S233XXA Sprain of ligaments of thoracic spine, initial encounter: Secondary | ICD-10-CM | POA: Diagnosis not present

## 2022-11-01 DIAGNOSIS — M9902 Segmental and somatic dysfunction of thoracic region: Secondary | ICD-10-CM | POA: Diagnosis not present

## 2022-11-01 DIAGNOSIS — M9901 Segmental and somatic dysfunction of cervical region: Secondary | ICD-10-CM | POA: Diagnosis not present

## 2022-11-13 DIAGNOSIS — F17200 Nicotine dependence, unspecified, uncomplicated: Secondary | ICD-10-CM | POA: Diagnosis not present

## 2022-11-13 DIAGNOSIS — G894 Chronic pain syndrome: Secondary | ICD-10-CM | POA: Diagnosis not present

## 2022-11-13 DIAGNOSIS — Z6834 Body mass index (BMI) 34.0-34.9, adult: Secondary | ICD-10-CM | POA: Diagnosis not present

## 2022-11-13 DIAGNOSIS — I714 Abdominal aortic aneurysm, without rupture, unspecified: Secondary | ICD-10-CM | POA: Diagnosis not present

## 2022-11-13 DIAGNOSIS — R7989 Other specified abnormal findings of blood chemistry: Secondary | ICD-10-CM | POA: Diagnosis not present

## 2022-11-13 DIAGNOSIS — M47816 Spondylosis without myelopathy or radiculopathy, lumbar region: Secondary | ICD-10-CM | POA: Diagnosis not present

## 2022-11-13 DIAGNOSIS — R030 Elevated blood-pressure reading, without diagnosis of hypertension: Secondary | ICD-10-CM | POA: Diagnosis not present

## 2022-11-13 DIAGNOSIS — Z79899 Other long term (current) drug therapy: Secondary | ICD-10-CM | POA: Diagnosis not present

## 2022-11-13 DIAGNOSIS — F17210 Nicotine dependence, cigarettes, uncomplicated: Secondary | ICD-10-CM | POA: Diagnosis not present

## 2022-11-13 DIAGNOSIS — M961 Postlaminectomy syndrome, not elsewhere classified: Secondary | ICD-10-CM | POA: Diagnosis not present

## 2022-11-15 DIAGNOSIS — Z79899 Other long term (current) drug therapy: Secondary | ICD-10-CM | POA: Diagnosis not present

## 2022-12-02 LAB — COLOGUARD

## 2022-12-11 DIAGNOSIS — Z1211 Encounter for screening for malignant neoplasm of colon: Secondary | ICD-10-CM | POA: Diagnosis not present

## 2022-12-14 DIAGNOSIS — Z6835 Body mass index (BMI) 35.0-35.9, adult: Secondary | ICD-10-CM | POA: Diagnosis not present

## 2022-12-14 DIAGNOSIS — F17200 Nicotine dependence, unspecified, uncomplicated: Secondary | ICD-10-CM | POA: Diagnosis not present

## 2022-12-14 DIAGNOSIS — I714 Abdominal aortic aneurysm, without rupture, unspecified: Secondary | ICD-10-CM | POA: Diagnosis not present

## 2022-12-14 DIAGNOSIS — G894 Chronic pain syndrome: Secondary | ICD-10-CM | POA: Diagnosis not present

## 2022-12-14 DIAGNOSIS — R030 Elevated blood-pressure reading, without diagnosis of hypertension: Secondary | ICD-10-CM | POA: Diagnosis not present

## 2022-12-14 DIAGNOSIS — R7989 Other specified abnormal findings of blood chemistry: Secondary | ICD-10-CM | POA: Diagnosis not present

## 2022-12-14 DIAGNOSIS — F17210 Nicotine dependence, cigarettes, uncomplicated: Secondary | ICD-10-CM | POA: Diagnosis not present

## 2022-12-14 DIAGNOSIS — Z79899 Other long term (current) drug therapy: Secondary | ICD-10-CM | POA: Diagnosis not present

## 2022-12-14 DIAGNOSIS — M961 Postlaminectomy syndrome, not elsewhere classified: Secondary | ICD-10-CM | POA: Diagnosis not present

## 2022-12-14 DIAGNOSIS — Z87891 Personal history of nicotine dependence: Secondary | ICD-10-CM | POA: Diagnosis not present

## 2022-12-14 DIAGNOSIS — M47816 Spondylosis without myelopathy or radiculopathy, lumbar region: Secondary | ICD-10-CM | POA: Diagnosis not present

## 2022-12-15 LAB — COLOGUARD: COLOGUARD: POSITIVE — AB

## 2022-12-26 DIAGNOSIS — F17210 Nicotine dependence, cigarettes, uncomplicated: Secondary | ICD-10-CM | POA: Diagnosis not present

## 2023-01-11 DIAGNOSIS — Z79899 Other long term (current) drug therapy: Secondary | ICD-10-CM | POA: Diagnosis not present

## 2023-01-11 DIAGNOSIS — M961 Postlaminectomy syndrome, not elsewhere classified: Secondary | ICD-10-CM | POA: Diagnosis not present

## 2023-01-11 DIAGNOSIS — G894 Chronic pain syndrome: Secondary | ICD-10-CM | POA: Diagnosis not present

## 2023-01-11 DIAGNOSIS — E6609 Other obesity due to excess calories: Secondary | ICD-10-CM | POA: Diagnosis not present

## 2023-01-11 DIAGNOSIS — F17200 Nicotine dependence, unspecified, uncomplicated: Secondary | ICD-10-CM | POA: Diagnosis not present

## 2023-01-11 DIAGNOSIS — Z6834 Body mass index (BMI) 34.0-34.9, adult: Secondary | ICD-10-CM | POA: Diagnosis not present

## 2023-01-11 DIAGNOSIS — R7989 Other specified abnormal findings of blood chemistry: Secondary | ICD-10-CM | POA: Diagnosis not present

## 2023-01-11 DIAGNOSIS — M47816 Spondylosis without myelopathy or radiculopathy, lumbar region: Secondary | ICD-10-CM | POA: Diagnosis not present

## 2023-01-11 DIAGNOSIS — R030 Elevated blood-pressure reading, without diagnosis of hypertension: Secondary | ICD-10-CM | POA: Diagnosis not present

## 2023-01-11 DIAGNOSIS — F17210 Nicotine dependence, cigarettes, uncomplicated: Secondary | ICD-10-CM | POA: Diagnosis not present

## 2023-01-11 DIAGNOSIS — I714 Abdominal aortic aneurysm, without rupture, unspecified: Secondary | ICD-10-CM | POA: Diagnosis not present

## 2023-01-16 DIAGNOSIS — M961 Postlaminectomy syndrome, not elsewhere classified: Secondary | ICD-10-CM | POA: Diagnosis not present

## 2023-01-16 DIAGNOSIS — M545 Low back pain, unspecified: Secondary | ICD-10-CM | POA: Diagnosis not present

## 2023-01-16 DIAGNOSIS — G8929 Other chronic pain: Secondary | ICD-10-CM | POA: Diagnosis not present

## 2023-01-16 DIAGNOSIS — Z981 Arthrodesis status: Secondary | ICD-10-CM | POA: Diagnosis not present

## 2023-01-23 ENCOUNTER — Ambulatory Visit (INDEPENDENT_AMBULATORY_CARE_PROVIDER_SITE_OTHER): Payer: Medicare PPO | Admitting: Internal Medicine

## 2023-01-23 ENCOUNTER — Encounter: Payer: Self-pay | Admitting: Internal Medicine

## 2023-01-23 VITALS — BP 149/89 | HR 80 | Temp 98.0°F | Ht 69.0 in | Wt 232.5 lb

## 2023-01-23 DIAGNOSIS — K625 Hemorrhage of anus and rectum: Secondary | ICD-10-CM | POA: Diagnosis not present

## 2023-01-23 DIAGNOSIS — R195 Other fecal abnormalities: Secondary | ICD-10-CM

## 2023-01-23 NOTE — Patient Instructions (Signed)
I am going to request colonoscopy records from [place].  Once I have reviewed these we will let you know if we need to expedite your colonoscopy.  I will place a recall today for colonoscopy to be done at age 62.  It was nice meeting you today.  Dr. Marletta Lor

## 2023-01-23 NOTE — Progress Notes (Signed)
Primary Care Physician:  Benita Stabile, MD Primary Gastroenterologist:  Dr. Marletta Lor  Chief Complaint  Patient presents with   New Patient (Initial Visit)    Patient here today for a new patient consult due to a positive cologuard. Patient denies any current dark or bloody stools. He says he does have some odorous stools at times.       HPI:   Anthony Seldon. is a 62 y.o. male who presents to clinic today by referral from his PCP Dr. Margo Aye for evaluation.  Recently had a positive Cologuard test which was positive.  Patient does note intermittent mild bright red blood on tissue paper when he wipes after bowel movements.  Believes this is due to hemorrhoids.  No melena.  No abdominal pain.  No unintentional weight loss.  States he had a colonoscopy 6 years ago which was WNL and was given a 10-year recall.  I do not have access to this report.  States this was done in [facility].  He is unsure of the physician practice that perform this.  No family history of colorectal malignancy.  Denies any upper GI symptoms including heartburn, reflux, odynophagia/dysphagia, epigastric or chest pain.  Past Medical History:  Diagnosis Date   AAA (abdominal aortic aneurysm) (HCC)    Back pain    Complication of anesthesia    sometimes sruggle waking up   Coronary artery disease    Hyperlipidemia    Hypertension    Myocardial infarction [facility])    PAD (peripheral artery disease) (HCC)    Stenosis of iliac artery (HCC)     Past Surgical History:  Procedure Laterality Date   ABDOMINAL AORTOGRAM W/LOWER EXTREMITY N/A 06/05/2019   Procedure: ABDOMINAL AORTOGRAM W/LOWER EXTREMITY;  Surgeon: Sherren Kerns, MD;  Location: [facility] INVASIVE CV LAB;  Service: Cardiovascular;  Laterality: N/A;   BACK SURGERY     CORONARY STENT INTERVENTION N/A 10/26/2019   Procedure: CORONARY STENT INTERVENTION;  Surgeon: Swaziland, Peter M, MD;  Location: [facility] INVASIVE CV LAB;  Service: Cardiovascular;   Laterality: N/A;   FEMORAL ARTERY - FEMORAL ARTERY BYPASS GRAFT  09/14/2019   FEMORAL-FEMORAL BYPASS GRAFT Bilateral 09/14/2019   Procedure: LEFT TO RIGHT BYPASS GRAFT FEMORAL-FEMORAL ARTERY USING HEMASHIELD GOLD GRAFT;  Surgeon: Sherren Kerns, MD;  Location: [facility] OR;  Service: Vascular;  Laterality: Bilateral;   I & D EXTREMITY Left 03/03/2022   Procedure: IRRIGATION AND DEBRIDEMENT/AMPUTATION;  Surgeon: Teryl Lucy, MD;  Location: [facility] OR;  Service: Orthopedics;  Laterality: Left;   LEFT HEART CATH AND CORONARY ANGIOGRAPHY N/A 10/26/2019   Procedure: LEFT HEART CATH AND CORONARY ANGIOGRAPHY;  Surgeon: Swaziland, Peter M, MD;  Location: [facility] INVASIVE CV LAB;  Service: Cardiovascular;  Laterality: N/A;   PERIPHERAL VASCULAR BALLOON ANGIOPLASTY Left 06/05/2019   Procedure: PERIPHERAL VASCULAR BALLOON ANGIOPLASTY;  Surgeon: Sherren Kerns, MD;  Location: [facility] INVASIVE CV LAB;  Service: Cardiovascular;  Laterality: Left;  Iliac   TONSILLECTOMY      Current Outpatient Medications  Medication Sig Dispense Refill   aspirin EC 81 MG tablet Take 81 mg by mouth at bedtime.     baclofen (LIORESAL) 10 MG tablet Take 10 mg by mouth daily at 6 (six) AM.     carvedilol (COREG) 6.25 MG tablet Take 1 tablet (6.25 mg total) by mouth 2 (two) times daily with a meal. 180 tablet 0   cholecalciferol (VITAMIN D3) 25 MCG (1000 UNIT) tablet Take 1,000 Units by mouth daily.  gabapentin (NEURONTIN) 300 MG capsule Take 300 mg by mouth 3 (three) times daily.     nitroGLYCERIN (NITROSTAT) 0.4 MG SL tablet Place 1 tablet under the tongue every 5 (five) minutes as needed for chest pain. Up to 3 doses.     olmesartan (BENICAR) 40 MG tablet Take 40 mg by mouth daily.     oxyCODONE-acetaminophen (PERCOCET) 10-325 MG tablet Take 1 tablet by mouth 3 (three) times daily.     rosuvastatin (CRESTOR) 20 MG tablet Take 1 tablet (20 mg total) by mouth daily. 90 tablet 0   XTAMPZA ER 9 MG C12A Take 9 mg by mouth every 12 (twelve) hours.      No current facility-administered medications for this visit.    Allergies as of 01/23/2023 - Review Complete 01/23/2023  Allergen Reaction Noted   Wound dressing adhesive Hives and Other (See Comments) 03/02/2022   Cymbalta [duloxetine hcl] Rash 02/26/2019    Family History  Problem Relation Age of Onset   Diabetes Mother    Cancer Father     Social History   Socioeconomic History   Marital status: Married    Spouse name: Not on file   Number of children: Not on file   Years of education: Not on file   Highest education level: Not on file  Occupational History   Not on file  Tobacco Use   Smoking status: Every Day    Current packs/day: 1.50    Average packs/day: 1.5 packs/day for 50.0 years (75.0 ttl pk-yrs)    Types: Cigarettes   Smokeless tobacco: Never  Vaping Use   Vaping status: Former  Substance and Sexual Activity   Alcohol use: Not Currently   Drug use: Not Currently   Sexual activity: Yes  Other Topics Concern   Not on file  Social History Narrative   Not on file   Social Determinants of Health   Financial Resource Strain: Not on file  Food Insecurity: Not on file  Transportation Needs: Not on file  Physical Activity: Not on file  Stress: Not on file  Social Connections: Not on file  Intimate Partner Violence: Not on file    Subjective: Review of Systems  Constitutional:  Negative for chills and fever.  HENT:  Negative for congestion and hearing loss.   Eyes:  Negative for blurred vision and double vision.  Respiratory:  Negative for cough and shortness of breath.   Cardiovascular:  Negative for chest pain and palpitations.  Gastrointestinal:  Negative for abdominal pain, blood in stool, constipation, diarrhea, heartburn, melena and vomiting.  Genitourinary:  Negative for dysuria and urgency.  Musculoskeletal:  Negative for joint pain and myalgias.  Skin:  Negative for itching and rash.  Neurological:  Negative for dizziness and headaches.   Psychiatric/Behavioral:  Negative for depression. The patient is not nervous/anxious.        Objective: BP (!) 149/89 (BP Location: Left Arm, Patient Position: Sitting, Cuff Size: Normal)   Pulse 80   Temp 98 F (36.7 C) (Temporal)   Ht 5\' 9"  (1.753 m)   Wt 232 lb 8 oz (105.5 kg)   BMI 34.33 kg/m  Physical Exam Constitutional:      Appearance: Normal appearance.  HENT:     Head: Normocephalic and atraumatic.  Eyes:     Extraocular Movements: Extraocular movements intact.     Conjunctiva/sclera: Conjunctivae normal.  Cardiovascular:     Rate and Rhythm: Normal rate and regular rhythm.  Pulmonary:     Effort:  Pulmonary effort is normal.     Breath sounds: Normal breath sounds.  Abdominal:     General: Bowel sounds are normal.     Palpations: Abdomen is soft.  Musculoskeletal:        General: Normal range of motion.     Cervical back: Normal range of motion and neck supple.  Skin:    General: Skin is warm.  Neurological:     General: No focal deficit present.     Mental Status: He is alert and oriented to person, place, and time.  Psychiatric:        Mood and Affect: Mood normal.        Behavior: Behavior normal.      Assessment: *Positive Cologuard testing *Rectal bleeding-mild, intermittent  Plan: Discussed patient's positive Cologuard testing in depth with him today.  I recommended colonoscopy to further evaluate.  He states the Cologuard test was positive due to hemorrhoid bleeding.  Reports that he had a colonoscopy 6 years ago which was normal and was given a 10-year recall.  We will try to find these records.  Patient would like to hold off on colonoscopy until age 55.  I did counsel him that there is a small chance we could be missing something more sinister besides hemorrhoids including polyps or malignancy and he understands.  Thank you Dr. Margo Aye for the kind referral.  01/23/2023 2:21 PM   Disclaimer: This note was dictated with voice recognition  software. Similar sounding words can inadvertently be transcribed and may not be corrected upon review.

## 2023-01-24 ENCOUNTER — Encounter: Payer: Self-pay | Admitting: Internal Medicine

## 2023-01-24 ENCOUNTER — Telehealth: Payer: Self-pay | Admitting: *Deleted

## 2023-01-24 MED ORDER — PEG 3350-KCL-NA BICARB-NACL 420 G PO SOLR: 4000.0000 mL | Freq: Once | ORAL | 0 refills | Status: AC

## 2023-01-24 NOTE — Telephone Encounter (Signed)
-----   Message from Lanelle Bal sent at 01/24/2023  2:50 PM EDT ----- Regarding: RE: [place] [facility] Reviewed records. Patient had 7 polyps on last colonoscopy in 2013. He def needs colonoscopy scheduled. Viviene Thurston please let him know and schedule if he is agreeable. ASA3, dx positive cologuard, history of polyps. Please make note in his chart about this communication. Thank you! ----- Message ----- From: Ferne Reus Sent: 01/24/2023   2:26 PM EDT To: Lanelle Bal, DO Subject: [place] [facility] records                    You seen patient recently and requested records from [facility]. They are in epic under media.

## 2023-01-24 NOTE — Telephone Encounter (Signed)
CALLED PT. He is aware of recs. He is agreeable. Procedure scheduled for 8/19. Aware will send instructions/pre-op to mychart. Rx for prep sent to pharmacy.   PA approved via cohere. DOS: 02/18/2023 - 04/20/2023, Authorization #[id number]

## 2023-01-25 DIAGNOSIS — Z4789 Encounter for other orthopedic aftercare: Secondary | ICD-10-CM | POA: Diagnosis not present

## 2023-01-25 DIAGNOSIS — I714 Abdominal aortic aneurysm, without rupture, unspecified: Secondary | ICD-10-CM | POA: Diagnosis not present

## 2023-01-25 DIAGNOSIS — M48061 Spinal stenosis, lumbar region without neurogenic claudication: Secondary | ICD-10-CM | POA: Diagnosis not present

## 2023-01-25 DIAGNOSIS — M4726 Other spondylosis with radiculopathy, lumbar region: Secondary | ICD-10-CM | POA: Diagnosis not present

## 2023-01-25 DIAGNOSIS — M5416 Radiculopathy, lumbar region: Secondary | ICD-10-CM | POA: Diagnosis not present

## 2023-01-25 DIAGNOSIS — D3500 Benign neoplasm of unspecified adrenal gland: Secondary | ICD-10-CM | POA: Diagnosis not present

## 2023-01-25 DIAGNOSIS — I700 Atherosclerosis of aorta: Secondary | ICD-10-CM | POA: Diagnosis not present

## 2023-01-25 DIAGNOSIS — M48062 Spinal stenosis, lumbar region with neurogenic claudication: Secondary | ICD-10-CM | POA: Diagnosis not present

## 2023-01-25 DIAGNOSIS — Z981 Arthrodesis status: Secondary | ICD-10-CM | POA: Diagnosis not present

## 2023-01-25 DIAGNOSIS — M4807 Spinal stenosis, lumbosacral region: Secondary | ICD-10-CM | POA: Diagnosis not present

## 2023-01-28 ENCOUNTER — Encounter: Payer: Self-pay | Admitting: *Deleted

## 2023-01-28 DIAGNOSIS — Z981 Arthrodesis status: Secondary | ICD-10-CM | POA: Diagnosis not present

## 2023-01-28 DIAGNOSIS — M5117 Intervertebral disc disorders with radiculopathy, lumbosacral region: Secondary | ICD-10-CM | POA: Diagnosis not present

## 2023-01-28 DIAGNOSIS — M5116 Intervertebral disc disorders with radiculopathy, lumbar region: Secondary | ICD-10-CM | POA: Diagnosis not present

## 2023-01-28 DIAGNOSIS — M48061 Spinal stenosis, lumbar region without neurogenic claudication: Secondary | ICD-10-CM | POA: Diagnosis not present

## 2023-02-01 ENCOUNTER — Telehealth: Payer: Self-pay | Admitting: *Deleted

## 2023-02-01 NOTE — Telephone Encounter (Signed)
Pt is cancelling his procedure on 02/18/23 with Dr.Carver due to conflict with another doctors appointment.  Pt advised provider booking into September and we don't have his schedule for September. Will call once we get it. Verbalized understanding.    TCS ASA 3 Dr.Carver

## 2023-02-09 ENCOUNTER — Other Ambulatory Visit: Payer: Self-pay

## 2023-02-09 ENCOUNTER — Emergency Department (HOSPITAL_BASED_OUTPATIENT_CLINIC_OR_DEPARTMENT_OTHER)
Admission: EM | Admit: 2023-02-09 | Discharge: 2023-02-09 | Disposition: A | Discharge status: Home / Self Care | Payer: Medicare PPO | Source: Home / Self Care | Attending: Emergency Medicine | Admitting: Emergency Medicine

## 2023-02-09 ENCOUNTER — Encounter (HOSPITAL_BASED_OUTPATIENT_CLINIC_OR_DEPARTMENT_OTHER): Payer: Self-pay

## 2023-02-09 DIAGNOSIS — Z7982 Long term (current) use of aspirin: Secondary | ICD-10-CM | POA: Insufficient documentation

## 2023-02-09 DIAGNOSIS — M545 Low back pain, unspecified: Secondary | ICD-10-CM | POA: Insufficient documentation

## 2023-02-09 MED ORDER — OXYCODONE-ACETAMINOPHEN 10-325 MG PO TABS: 1.0000 | ORAL_TABLET | Freq: Three times a day (TID) | ORAL | Status: DC

## 2023-02-09 MED ORDER — OXYCODONE HCL 5 MG PO TABS: 5.0000 mg | ORAL_TABLET | Freq: Three times a day (TID) | ORAL | Status: DC

## 2023-02-09 MED ORDER — OXYCODONE-ACETAMINOPHEN 5-325 MG PO TABS
2.0000 | ORAL_TABLET | Freq: Once | ORAL | Status: AC
  Administered 2023-02-09: 2 via ORAL
  Filled 2023-02-09: qty 2

## 2023-02-09 MED ORDER — OXYCODONE-ACETAMINOPHEN 5-325 MG PO TABS: 1.0000 | ORAL_TABLET | Freq: Three times a day (TID) | ORAL | Status: DC

## 2023-02-09 MED ORDER — LIDOCAINE 5 % EX PTCH
1.0000 | MEDICATED_PATCH | CUTANEOUS | Status: DC
  Administered 2023-02-09: 1 via TRANSDERMAL
  Filled 2023-02-09: qty 1

## 2023-02-09 MED ORDER — LIDOCAINE 5 % EX PTCH: 1.0000 | MEDICATED_PATCH | CUTANEOUS | 0 refills | Status: DC

## 2023-02-09 NOTE — Discharge Instructions (Addendum)
Evaluation today for back pain was overall reassuring.  Suspect it is chronic in nature.  Recommend you follow-up with your primary care provider for your ongoing back pain.  Recommend ibuprofen and Lidoderm patches at home.  Also recommend conservative treatment.

## 2023-02-09 NOTE — ED Triage Notes (Signed)
Pt presents to triage with c/o lower back pain. Pt has history of nerve compression at L5S1 with surgery 3 years ago. Complications with initial procedure have continued since surgery. Today pain is getting too bad to handle.

## 2023-02-09 NOTE — ED Provider Notes (Signed)
[place] EMERGENCY DEPARTMENT AT [facility] Provider Note   CSN: [id number] Arrival date & time: 02/09/23  1652     History  Chief Complaint  Patient presents with   Back Pain   HPI Anthony Todd. is a 62 y.o. male with history of AAA, PAD, chronic back pain presenting for back pain.  States it has been ongoing for 2 to 3 months now but has been much worse the last couple days.  Also states that he ran out of his oxycodone about 4 days ago.  States he normally gets treatment for his chronic back pain but back to medical.  He attempted to call them multiple times this week and was unable to make contact with his medical provider.  Denies saddle anesthesia, urinary or bowel incontinence or retention, IV drug use, recent trauma.  Denies urinary symptoms.  States he normally walks with a cane at home but it has been more difficult wearing the last couple days.   Back Pain      Home Medications Prior to Admission medications   Medication Sig Start Date End Date Taking? Authorizing Provider  lidocaine (LIDODERM) 5 % Place 1 patch onto the skin daily. Remove & Discard patch within 12 hours or as directed by MD 02/09/23  Yes Gareth Eagle, PA-C  aspirin EC 81 MG tablet Take 81 mg by mouth at bedtime.    [provider]  baclofen (LIORESAL) 10 MG tablet Take 10 mg by mouth daily at 6 (six) AM.    [provider]  carvedilol (COREG) 6.25 MG tablet Take 1 tablet (6.25 mg total) by mouth 2 (two) times daily with a meal. 10/27/19   Laverda Page B, NP  cholecalciferol (VITAMIN D3) 25 MCG (1000 UNIT) tablet Take 1,000 Units by mouth daily.    [provider]  gabapentin (NEURONTIN) 300 MG capsule Take 300 mg by mouth 3 (three) times daily.    [provider]  nitroGLYCERIN (NITROSTAT) 0.4 MG SL tablet Place 1 tablet under the tongue every 5 (five) minutes as needed for chest pain. Up to 3 doses. 10/20/19   [provider]   olmesartan (BENICAR) 40 MG tablet Take 40 mg by mouth daily. 10/21/19   [provider]  oxyCODONE-acetaminophen (PERCOCET) 10-325 MG tablet Take 1 tablet by mouth 3 (three) times daily.    [provider]  rosuvastatin (CRESTOR) 20 MG tablet Take 1 tablet (20 mg total) by mouth daily. 10/28/19   Arty Baumgartner, NP  XTAMPZA ER 9 MG C12A Take 9 mg by mouth every 12 (twelve) hours.    [provider]      Allergies    Wound dressing adhesive and Cymbalta [duloxetine hcl]    Review of Systems   Review of Systems  Musculoskeletal:  Positive for back pain.    Physical Exam Updated Vital Signs BP 138/87 (BP Location: Left Arm)   Pulse 95   Temp 98.2 F (36.8 C)   Resp 18   Ht 5\' 9"  (1.753 m)   Wt 102.5 kg   SpO2 94%   BMI 33.37 kg/m  Physical Exam Constitutional:      Appearance: Normal appearance.  HENT:     Head: Normocephalic.     Nose: Nose normal.  Eyes:     Conjunctiva/sclera: Conjunctivae normal.  Pulmonary:     Effort: Pulmonary effort is normal.  Abdominal:     Tenderness: There is no abdominal tenderness. There is no right  CVA tenderness or left CVA tenderness.  Musculoskeletal:     Lumbar back: Tenderness present. No swelling, edema, deformity or spasms. Normal range of motion.     Comments: Tenderness all about the lumbar region.  Range of motion appears normal.  Able to ambulate around the room with the assistance of his cane.  Neurological:     Mental Status: He is alert.  Psychiatric:        Mood and Affect: Mood normal.     ED Results / Procedures / Treatments   Labs (all labs ordered are listed, but only abnormal results are displayed) Labs Reviewed - No data to display  EKG None  Radiology No results found.  Procedures Procedures    Medications Ordered in ED Medications  lidocaine (LIDODERM) 5 % 1 patch (1 patch Transdermal Patch Applied 02/09/23 1838)  oxyCODONE-acetaminophen (PERCOCET/ROXICET) 5-325 MG per  tablet 2 tablet (2 tablets Oral Given 02/09/23 1843)    ED Course/ Medical Decision Making/ A&P                                 Medical Decision Making  62 year old well-appearing male presenting for back pain.  Exam notable for generalized lower back pain but otherwise reassuring.  Treated with Percocet and Lidoderm patches.  No red flag symptoms of back pain reported today making acute spinal injury, cauda equina syndrome or infection unlikely.  Suspect chronic back pain.  Advised him to follow-up with his PCP.  Vital stable.  Discharged home good condition.        Final Clinical Impression(s) / ED Diagnoses Final diagnoses:  Low back pain, unspecified back pain laterality, unspecified chronicity, unspecified whether sciatica present    Rx / DC Orders ED Discharge Orders          Ordered    lidocaine (LIDODERM) 5 %  Every 24 hours        02/09/23 1846              Gareth Eagle, PA-C 02/09/23 1846    Sloan Leiter, DO 02/09/23 2253

## 2023-02-12 NOTE — Telephone Encounter (Signed)
Called to schedule pt for his colonoscopy and he states that he may be having back surgery soon. He wants to wait and see what doctor says before scheduling his procedure. He will give [place] a call back once he has the details and can schedule

## 2023-02-14 ENCOUNTER — Inpatient Hospital Stay (HOSPITAL_COMMUNITY): Admission: RE | Admit: 2023-02-14 | Payer: Medicare PPO | Source: Ambulatory Visit

## 2023-02-18 ENCOUNTER — Encounter (HOSPITAL_COMMUNITY): Payer: Self-pay

## 2023-02-18 ENCOUNTER — Ambulatory Visit (HOSPITAL_COMMUNITY): Admit: 2023-02-18 | Payer: Medicare PPO

## 2023-02-18 DIAGNOSIS — Z79899 Other long term (current) drug therapy: Secondary | ICD-10-CM | POA: Diagnosis not present

## 2023-02-18 DIAGNOSIS — R7989 Other specified abnormal findings of blood chemistry: Secondary | ICD-10-CM | POA: Diagnosis not present

## 2023-02-18 DIAGNOSIS — M961 Postlaminectomy syndrome, not elsewhere classified: Secondary | ICD-10-CM | POA: Diagnosis not present

## 2023-02-18 DIAGNOSIS — M47816 Spondylosis without myelopathy or radiculopathy, lumbar region: Secondary | ICD-10-CM | POA: Diagnosis not present

## 2023-02-18 DIAGNOSIS — E6609 Other obesity due to excess calories: Secondary | ICD-10-CM | POA: Diagnosis not present

## 2023-02-18 DIAGNOSIS — I714 Abdominal aortic aneurysm, without rupture, unspecified: Secondary | ICD-10-CM | POA: Diagnosis not present

## 2023-02-18 DIAGNOSIS — G894 Chronic pain syndrome: Secondary | ICD-10-CM | POA: Diagnosis not present

## 2023-02-18 DIAGNOSIS — R030 Elevated blood-pressure reading, without diagnosis of hypertension: Secondary | ICD-10-CM | POA: Diagnosis not present

## 2023-02-18 DIAGNOSIS — Z6834 Body mass index (BMI) 34.0-34.9, adult: Secondary | ICD-10-CM | POA: Diagnosis not present

## 2023-02-18 SURGERY — COLONOSCOPY WITH PROPOFOL
Anesthesia: Monitor Anesthesia Care

## 2023-02-21 DIAGNOSIS — Z79899 Other long term (current) drug therapy: Secondary | ICD-10-CM | POA: Diagnosis not present

## 2023-03-06 DIAGNOSIS — Z981 Arthrodesis status: Secondary | ICD-10-CM | POA: Diagnosis not present

## 2023-03-06 DIAGNOSIS — M545 Low back pain, unspecified: Secondary | ICD-10-CM | POA: Diagnosis not present

## 2023-03-06 DIAGNOSIS — M8589 Other specified disorders of bone density and structure, multiple sites: Secondary | ICD-10-CM | POA: Diagnosis not present

## 2023-03-06 DIAGNOSIS — G8929 Other chronic pain: Secondary | ICD-10-CM | POA: Diagnosis not present

## 2023-03-08 DIAGNOSIS — M961 Postlaminectomy syndrome, not elsewhere classified: Secondary | ICD-10-CM | POA: Diagnosis not present

## 2023-03-08 DIAGNOSIS — R030 Elevated blood-pressure reading, without diagnosis of hypertension: Secondary | ICD-10-CM | POA: Diagnosis not present

## 2023-03-08 DIAGNOSIS — M47816 Spondylosis without myelopathy or radiculopathy, lumbar region: Secondary | ICD-10-CM | POA: Diagnosis not present

## 2023-03-08 DIAGNOSIS — G894 Chronic pain syndrome: Secondary | ICD-10-CM | POA: Diagnosis not present

## 2023-03-08 DIAGNOSIS — R7989 Other specified abnormal findings of blood chemistry: Secondary | ICD-10-CM | POA: Diagnosis not present

## 2023-03-08 DIAGNOSIS — F17200 Nicotine dependence, unspecified, uncomplicated: Secondary | ICD-10-CM | POA: Diagnosis not present

## 2023-03-08 DIAGNOSIS — Z79899 Other long term (current) drug therapy: Secondary | ICD-10-CM | POA: Diagnosis not present

## 2023-03-08 DIAGNOSIS — I714 Abdominal aortic aneurysm, without rupture, unspecified: Secondary | ICD-10-CM | POA: Diagnosis not present

## 2023-03-08 DIAGNOSIS — F17210 Nicotine dependence, cigarettes, uncomplicated: Secondary | ICD-10-CM | POA: Diagnosis not present

## 2023-03-13 DIAGNOSIS — Z79899 Other long term (current) drug therapy: Secondary | ICD-10-CM | POA: Diagnosis not present

## 2023-03-13 DIAGNOSIS — Z79891 Long term (current) use of opiate analgesic: Secondary | ICD-10-CM | POA: Diagnosis not present

## 2023-03-13 DIAGNOSIS — G8929 Other chronic pain: Secondary | ICD-10-CM | POA: Diagnosis not present

## 2023-03-13 DIAGNOSIS — M549 Dorsalgia, unspecified: Secondary | ICD-10-CM | POA: Diagnosis not present

## 2023-03-13 DIAGNOSIS — F17200 Nicotine dependence, unspecified, uncomplicated: Secondary | ICD-10-CM | POA: Diagnosis not present

## 2023-03-13 DIAGNOSIS — I10 Essential (primary) hypertension: Secondary | ICD-10-CM | POA: Diagnosis not present

## 2023-03-13 DIAGNOSIS — F17210 Nicotine dependence, cigarettes, uncomplicated: Secondary | ICD-10-CM | POA: Diagnosis not present

## 2023-03-13 DIAGNOSIS — Z716 Tobacco abuse counseling: Secondary | ICD-10-CM | POA: Diagnosis not present

## 2023-03-15 DIAGNOSIS — Z79899 Other long term (current) drug therapy: Secondary | ICD-10-CM | POA: Diagnosis not present

## 2023-03-22 DIAGNOSIS — I10 Essential (primary) hypertension: Secondary | ICD-10-CM | POA: Diagnosis not present

## 2023-03-22 DIAGNOSIS — E538 Deficiency of other specified B group vitamins: Secondary | ICD-10-CM | POA: Diagnosis not present

## 2023-03-22 DIAGNOSIS — E559 Vitamin D deficiency, unspecified: Secondary | ICD-10-CM | POA: Diagnosis not present

## 2023-03-22 DIAGNOSIS — Z125 Encounter for screening for malignant neoplasm of prostate: Secondary | ICD-10-CM | POA: Diagnosis not present

## 2023-03-22 DIAGNOSIS — R7303 Prediabetes: Secondary | ICD-10-CM | POA: Diagnosis not present

## 2023-03-28 DIAGNOSIS — G8929 Other chronic pain: Secondary | ICD-10-CM | POA: Diagnosis not present

## 2023-03-28 DIAGNOSIS — R065 Mouth breathing: Secondary | ICD-10-CM | POA: Diagnosis not present

## 2023-03-28 DIAGNOSIS — I10 Essential (primary) hypertension: Secondary | ICD-10-CM | POA: Diagnosis not present

## 2023-03-28 DIAGNOSIS — R7303 Prediabetes: Secondary | ICD-10-CM | POA: Diagnosis not present

## 2023-03-28 DIAGNOSIS — I25119 Atherosclerotic heart disease of native coronary artery with unspecified angina pectoris: Secondary | ICD-10-CM | POA: Diagnosis not present

## 2023-03-28 DIAGNOSIS — I714 Abdominal aortic aneurysm, without rupture, unspecified: Secondary | ICD-10-CM | POA: Diagnosis not present

## 2023-03-28 DIAGNOSIS — I739 Peripheral vascular disease, unspecified: Secondary | ICD-10-CM | POA: Diagnosis not present

## 2023-03-28 DIAGNOSIS — E559 Vitamin D deficiency, unspecified: Secondary | ICD-10-CM | POA: Diagnosis not present

## 2023-03-28 DIAGNOSIS — E538 Deficiency of other specified B group vitamins: Secondary | ICD-10-CM | POA: Diagnosis not present

## 2023-04-08 DIAGNOSIS — M961 Postlaminectomy syndrome, not elsewhere classified: Secondary | ICD-10-CM | POA: Diagnosis not present

## 2023-04-08 DIAGNOSIS — M47816 Spondylosis without myelopathy or radiculopathy, lumbar region: Secondary | ICD-10-CM | POA: Diagnosis not present

## 2023-04-08 DIAGNOSIS — Z9181 History of falling: Secondary | ICD-10-CM | POA: Diagnosis not present

## 2023-04-08 DIAGNOSIS — R7989 Other specified abnormal findings of blood chemistry: Secondary | ICD-10-CM | POA: Diagnosis not present

## 2023-04-08 DIAGNOSIS — F17200 Nicotine dependence, unspecified, uncomplicated: Secondary | ICD-10-CM | POA: Diagnosis not present

## 2023-04-08 DIAGNOSIS — Z6834 Body mass index (BMI) 34.0-34.9, adult: Secondary | ICD-10-CM | POA: Diagnosis not present

## 2023-04-08 DIAGNOSIS — R030 Elevated blood-pressure reading, without diagnosis of hypertension: Secondary | ICD-10-CM | POA: Diagnosis not present

## 2023-04-08 DIAGNOSIS — G894 Chronic pain syndrome: Secondary | ICD-10-CM | POA: Diagnosis not present

## 2023-04-08 DIAGNOSIS — F17210 Nicotine dependence, cigarettes, uncomplicated: Secondary | ICD-10-CM | POA: Diagnosis not present

## 2023-04-08 DIAGNOSIS — Z79899 Other long term (current) drug therapy: Secondary | ICD-10-CM | POA: Diagnosis not present

## 2023-04-12 DIAGNOSIS — Z79899 Other long term (current) drug therapy: Secondary | ICD-10-CM | POA: Diagnosis not present

## 2023-05-08 DIAGNOSIS — M47816 Spondylosis without myelopathy or radiculopathy, lumbar region: Secondary | ICD-10-CM | POA: Diagnosis not present

## 2023-05-08 DIAGNOSIS — M961 Postlaminectomy syndrome, not elsewhere classified: Secondary | ICD-10-CM | POA: Diagnosis not present

## 2023-05-08 DIAGNOSIS — R7989 Other specified abnormal findings of blood chemistry: Secondary | ICD-10-CM | POA: Diagnosis not present

## 2023-05-08 DIAGNOSIS — R030 Elevated blood-pressure reading, without diagnosis of hypertension: Secondary | ICD-10-CM | POA: Diagnosis not present

## 2023-05-08 DIAGNOSIS — Z9181 History of falling: Secondary | ICD-10-CM | POA: Diagnosis not present

## 2023-05-08 DIAGNOSIS — Z6834 Body mass index (BMI) 34.0-34.9, adult: Secondary | ICD-10-CM | POA: Diagnosis not present

## 2023-05-08 DIAGNOSIS — F17210 Nicotine dependence, cigarettes, uncomplicated: Secondary | ICD-10-CM | POA: Diagnosis not present

## 2023-05-08 DIAGNOSIS — F17200 Nicotine dependence, unspecified, uncomplicated: Secondary | ICD-10-CM | POA: Diagnosis not present

## 2023-05-08 DIAGNOSIS — Z79899 Other long term (current) drug therapy: Secondary | ICD-10-CM | POA: Diagnosis not present

## 2023-05-08 DIAGNOSIS — G894 Chronic pain syndrome: Secondary | ICD-10-CM | POA: Diagnosis not present

## 2023-05-28 ENCOUNTER — Ambulatory Visit: Payer: Medicare PPO | Admitting: Internal Medicine

## 2023-06-03 ENCOUNTER — Ambulatory Visit: Payer: Medicare PPO | Attending: Physician Assistant | Admitting: Physician Assistant

## 2023-06-03 ENCOUNTER — Encounter: Payer: Self-pay | Admitting: Physician Assistant

## 2023-06-03 VITALS — BP 140/90 | HR 68 | Ht 69.0 in | Wt 233.6 lb

## 2023-06-03 DIAGNOSIS — I739 Peripheral vascular disease, unspecified: Secondary | ICD-10-CM

## 2023-06-03 DIAGNOSIS — I2510 Atherosclerotic heart disease of native coronary artery without angina pectoris: Secondary | ICD-10-CM | POA: Diagnosis not present

## 2023-06-03 DIAGNOSIS — E782 Mixed hyperlipidemia: Secondary | ICD-10-CM

## 2023-06-03 DIAGNOSIS — I10 Essential (primary) hypertension: Secondary | ICD-10-CM | POA: Diagnosis not present

## 2023-06-03 DIAGNOSIS — Z720 Tobacco use: Secondary | ICD-10-CM

## 2023-06-03 DIAGNOSIS — I714 Abdominal aortic aneurysm, without rupture, unspecified: Secondary | ICD-10-CM

## 2023-06-03 MED ORDER — HYDRALAZINE HCL 25 MG PO TABS: 25.0000 mg | ORAL_TABLET | Freq: Three times a day (TID) | ORAL | 6 refills | Status: AC

## 2023-06-03 MED ORDER — VARENICLINE TARTRATE (STARTER) 0.5 MG X 11 & 1 MG X 42 PO TBPK: 0.5000 mg | ORAL_TABLET | ORAL | 0 refills | Status: DC

## 2023-06-03 NOTE — Patient Instructions (Addendum)
Medication Instructions:  Your physician has recommended you make the following change in your medication:  Increase hydralazine 25 mg to three times daily Start chantix start pak as directed (0.5 mg daily days 1-3, then; 0.5 mg twice daily on days 4-7, then; 1 mg twice daily on day 8) Continue all other medications as prescribed  Labwork: none  Testing/Procedures: none  Follow-Up: Your physician recommends that you schedule a follow-up appointment in: 6 months  Any Other Special Instructions Will Be Listed Below (If Applicable).  If you need a refill on your cardiac medications before your next appointment, please call your pharmacy.

## 2023-06-03 NOTE — Progress Notes (Signed)
Cardiology Office Note:  .   Date:  06/03/2023  ID:  Anthony Pontes., DOB 11/13/1960, MRN [id number] PCP: Benita Stabile, MD  [facility] Providers Cardiologist:  Marjo Bicker, MD Electrophysiologist:  Lewayne Bunting, MD    History of Present Illness: .   Anthony Rask. is a 62 y.o. male  with a history of  CAD s/p NSTEMI in 10/2019 s/p RCA PCI with residual nonobstructive CAD RPL/LAD/LCx and normal LVEF, PAD s/p R to L femorofemoral bypass in 2021, HTN, HLD, nicotine abuse, AAA (monitored by vascular surgery).  Patient comes in for f/u. Started on hydralazine by PCP for HTN about a month ago. BP still high. Not using a lot of salt. Smoking 1 1/2 ppd. Having a lot of back pain and using oxycodone and will need another surgery. Was told he has to quit smoking before they'll do surgery. Nicotine patches haven't worked, chantix helped him reduce it in the past. He'd like to try to quit. Can't exercise because of back pain.   ROS:    Studies Reviewed: Marland Kitchen    EKG Interpretation Date/Time:  Monday June 03 2023 13:01:49 EST Ventricular Rate:  68 PR Interval:  158 QRS Duration:  92 QT Interval:  386 QTC Calculation: 410 R Axis:   16  Text Interpretation: Normal sinus rhythm Normal ECG When compared with ECG of 02-Mar-2022 20:58, PREVIOUS ECG IS PRESENT Confirmed by Jacolyn Reedy [phone]) on 06/03/2023 1:09:25 PM    Prior CV Studies:      Risk Assessment/Calculations:     HYPERTENSION CONTROL Vitals:   06/03/23 1255 06/03/23 1305 06/03/23 1332  BP: (!) 160/100 (!) 162/90 (!) 140/90    The patient's blood pressure is elevated above target today.  In order to address the patient's elevated BP: Blood pressure will be monitored at home to determine if medication changes need to be made.; A current anti-hypertensive medication was adjusted today.; Follow up with primary care provider for management.          Physical Exam:   VS:  BP (!) 140/90   Pulse 68    Ht 5\' 9"  (1.753 m)   Wt 233 lb 9.6 oz (106 kg)   SpO2 96%   BMI 34.50 kg/m    Wt Readings from Last 3 Encounters:  06/03/23 233 lb 9.6 oz (106 kg)  02/09/23 226 lb (102.5 kg)  01/23/23 232 lb 8 oz (105.5 kg)    GEN: Obese in no acute distress NECK: No JVD; No carotid bruits CARDIAC:  RRR, no murmurs, rubs, gallops RESPIRATORY:  diffuse wheezing or rhonchi throughout ABDOMEN: Soft, non-tender, non-distended EXTREMITIES:  No edema; No deformity   ASSESSMENT AND PLAN: .     CAD manifested by NSTEMI in 10/2019 s/p RCA PCI with residual nonobstructive CAD and RPL/LAD/LCx and normal LVEF, currently angina free -Continue aspirin 81 mg once daily.Stop Brilinta. -Continue rosuvastatin 20 mg nightly   PAD s/p R to L femorofemoral bypass in 2021, claudication free -Continue aspirin 81 mg once daily and rosuvastatin 20 mg nightly -Patient follows up with vascular surgery for PAD   HLD ran out of rosuvastatin and now on simvastatin per PCP, LDL 80 03/2023    HTN,uncontrolled -Continue carvedilol 6.25 twice daily -Continue olmesartan 40 mg once daily -increase hydralazine 25 mg tid    AAA 3.2 cm -Follow-up with vascular surgery   Nicotine abuse -Patient wants to quit smoking and had some success with Chantix in the past-will Rx.  0.5 mg every day days 1-3, Day 4-7 0.5 mg bid, day 8 beyond 1 mg bid for at least 12 weeks.          Dispo: f/u in 6 months.  Signed, Jacolyn Reedy, PA-C

## 2023-06-07 DIAGNOSIS — M47816 Spondylosis without myelopathy or radiculopathy, lumbar region: Secondary | ICD-10-CM | POA: Diagnosis not present

## 2023-06-07 DIAGNOSIS — Z6834 Body mass index (BMI) 34.0-34.9, adult: Secondary | ICD-10-CM | POA: Diagnosis not present

## 2023-06-07 DIAGNOSIS — F17200 Nicotine dependence, unspecified, uncomplicated: Secondary | ICD-10-CM | POA: Diagnosis not present

## 2023-06-07 DIAGNOSIS — R7989 Other specified abnormal findings of blood chemistry: Secondary | ICD-10-CM | POA: Diagnosis not present

## 2023-06-07 DIAGNOSIS — R030 Elevated blood-pressure reading, without diagnosis of hypertension: Secondary | ICD-10-CM | POA: Diagnosis not present

## 2023-06-07 DIAGNOSIS — M961 Postlaminectomy syndrome, not elsewhere classified: Secondary | ICD-10-CM | POA: Diagnosis not present

## 2023-06-07 DIAGNOSIS — Z9181 History of falling: Secondary | ICD-10-CM | POA: Diagnosis not present

## 2023-06-07 DIAGNOSIS — G894 Chronic pain syndrome: Secondary | ICD-10-CM | POA: Diagnosis not present

## 2023-06-07 DIAGNOSIS — Z79899 Other long term (current) drug therapy: Secondary | ICD-10-CM | POA: Diagnosis not present

## 2023-06-12 DIAGNOSIS — Z79899 Other long term (current) drug therapy: Secondary | ICD-10-CM | POA: Diagnosis not present

## 2023-07-08 DIAGNOSIS — R030 Elevated blood-pressure reading, without diagnosis of hypertension: Secondary | ICD-10-CM | POA: Diagnosis not present

## 2023-07-08 DIAGNOSIS — Z0000 Encounter for general adult medical examination without abnormal findings: Secondary | ICD-10-CM | POA: Diagnosis not present

## 2023-07-08 DIAGNOSIS — E6609 Other obesity due to excess calories: Secondary | ICD-10-CM | POA: Diagnosis not present

## 2023-07-08 DIAGNOSIS — Z9181 History of falling: Secondary | ICD-10-CM | POA: Diagnosis not present

## 2023-07-08 DIAGNOSIS — M961 Postlaminectomy syndrome, not elsewhere classified: Secondary | ICD-10-CM | POA: Diagnosis not present

## 2023-07-08 DIAGNOSIS — G894 Chronic pain syndrome: Secondary | ICD-10-CM | POA: Diagnosis not present

## 2023-07-08 DIAGNOSIS — Z79899 Other long term (current) drug therapy: Secondary | ICD-10-CM | POA: Diagnosis not present

## 2023-07-08 DIAGNOSIS — M47816 Spondylosis without myelopathy or radiculopathy, lumbar region: Secondary | ICD-10-CM | POA: Diagnosis not present

## 2023-07-08 DIAGNOSIS — F17200 Nicotine dependence, unspecified, uncomplicated: Secondary | ICD-10-CM | POA: Diagnosis not present

## 2023-07-09 ENCOUNTER — Ambulatory Visit: Payer: Medicare PPO | Admitting: Cardiology

## 2023-07-10 ENCOUNTER — Encounter (HOSPITAL_COMMUNITY): Payer: Self-pay

## 2023-07-10 ENCOUNTER — Other Ambulatory Visit: Payer: Self-pay

## 2023-07-10 ENCOUNTER — Emergency Department (HOSPITAL_COMMUNITY)
Admission: EM | Admit: 2023-07-10 | Discharge: 2023-07-10 | Disposition: A | Discharge status: Home / Self Care | Payer: Medicare PPO | Attending: Emergency Medicine | Admitting: Emergency Medicine

## 2023-07-10 ENCOUNTER — Emergency Department (HOSPITAL_COMMUNITY): Payer: Medicare PPO

## 2023-07-10 DIAGNOSIS — R059 Cough, unspecified: Secondary | ICD-10-CM | POA: Diagnosis not present

## 2023-07-10 DIAGNOSIS — B974 Respiratory syncytial virus as the cause of diseases classified elsewhere: Secondary | ICD-10-CM | POA: Insufficient documentation

## 2023-07-10 DIAGNOSIS — J069 Acute upper respiratory infection, unspecified: Secondary | ICD-10-CM | POA: Insufficient documentation

## 2023-07-10 DIAGNOSIS — F17210 Nicotine dependence, cigarettes, uncomplicated: Secondary | ICD-10-CM | POA: Diagnosis not present

## 2023-07-10 DIAGNOSIS — Z7982 Long term (current) use of aspirin: Secondary | ICD-10-CM | POA: Diagnosis not present

## 2023-07-10 DIAGNOSIS — B9789 Other viral agents as the cause of diseases classified elsewhere: Secondary | ICD-10-CM | POA: Insufficient documentation

## 2023-07-10 DIAGNOSIS — R0602 Shortness of breath: Secondary | ICD-10-CM | POA: Diagnosis not present

## 2023-07-10 DIAGNOSIS — Z20822 Contact with and (suspected) exposure to covid-19: Secondary | ICD-10-CM | POA: Diagnosis not present

## 2023-07-10 LAB — CBC WITH DIFFERENTIAL/PLATELET
Abs Immature Granulocytes: 0.02 10*3/uL (ref 0.00–0.07)
Basophils Absolute: 0.1 10*3/uL (ref 0.0–0.1)
Basophils Relative: 1 %
Eosinophils Absolute: 0.3 10*3/uL (ref 0.0–0.5)
Eosinophils Relative: 3 %
HCT: 46.6 % (ref 39.0–52.0)
Hemoglobin: 16 g/dL (ref 13.0–17.0)
Immature Granulocytes: 0 %
Lymphocytes Relative: 19 %
Lymphs Abs: 2 10*3/uL (ref 0.7–4.0)
MCH: 30.3 pg (ref 26.0–34.0)
MCHC: 34.3 g/dL (ref 30.0–36.0)
MCV: 88.3 fL (ref 80.0–100.0)
Monocytes Absolute: 1.3 10*3/uL — ABNORMAL HIGH (ref 0.1–1.0)
Monocytes Relative: 13 %
Neutro Abs: 6.5 10*3/uL (ref 1.7–7.7)
Neutrophils Relative %: 64 %
Platelets: 144 10*3/uL — ABNORMAL LOW (ref 150–400)
RBC: 5.28 MIL/uL (ref 4.22–5.81)
RDW: 13.9 % (ref 11.5–15.5)
WBC: 10.1 10*3/uL (ref 4.0–10.5)
nRBC: 0 % (ref 0.0–0.2)

## 2023-07-10 LAB — RESP PANEL BY RT-PCR (RSV, FLU A&B, COVID)  RVPGX2
Influenza A by PCR: NEGATIVE
Influenza B by PCR: NEGATIVE
Resp Syncytial Virus by PCR: POSITIVE — AB
SARS Coronavirus 2 by RT PCR: NEGATIVE

## 2023-07-10 LAB — BASIC METABOLIC PANEL
Anion gap: 8 (ref 5–15)
BUN: 17 mg/dL (ref 8–23)
CO2: 27 mmol/L (ref 22–32)
Calcium: 8.9 mg/dL (ref 8.9–10.3)
Chloride: 102 mmol/L (ref 98–111)
Creatinine, Ser: 0.82 mg/dL (ref 0.61–1.24)
GFR, Estimated: >60 mL/min (ref >60)
Glucose, Bld: 89 mg/dL (ref 70–99)
Potassium: 3.9 mmol/L (ref 3.5–5.1)
Sodium: 137 mmol/L (ref 135–145)

## 2023-07-10 MED ORDER — ALBUTEROL SULFATE HFA 108 (90 BASE) MCG/ACT IN AERS
2.0000 | INHALATION_SPRAY | Freq: Once | RESPIRATORY_TRACT | Status: AC
  Administered 2023-07-10: 2 via RESPIRATORY_TRACT
  Filled 2023-07-10: qty 6.7

## 2023-07-10 MED ORDER — PREDNISONE 10 MG PO TABS: 40.0000 mg | ORAL_TABLET | Freq: Every day | ORAL | 0 refills | Status: AC

## 2023-07-10 MED ORDER — METHYLPREDNISOLONE SODIUM SUCC 125 MG IJ SOLR
125.0000 mg | Freq: Once | INTRAMUSCULAR | Status: AC
  Administered 2023-07-10: 125 mg via INTRAVENOUS
  Filled 2023-07-10: qty 2

## 2023-07-10 MED ORDER — IPRATROPIUM-ALBUTEROL 0.5-2.5 (3) MG/3ML IN SOLN
3.0000 mL | Freq: Once | RESPIRATORY_TRACT | Status: AC
  Administered 2023-07-10: 3 mL via RESPIRATORY_TRACT
  Filled 2023-07-10: qty 3

## 2023-07-10 NOTE — ED Provider Notes (Signed)
 [place] EMERGENCY DEPARTMENT AT [facility] Provider Note   CSN: [id number] Arrival date & time: 07/10/23  1722     History  Chief Complaint  Patient presents with   Shortness of Breath    Anthony Todd. is a 63 y.o. male.  63 year old male here today for shortness of breath.  Patient states that he has had a cough, runny nose and chest congestion over the last few days.  Patient smokes 1/2 packs/day, is working on quitting.   Shortness of Breath      Home Medications Prior to Admission medications   Medication Sig Start Date End Date Taking? Authorizing Provider  predniSONE  (DELTASONE ) 10 MG tablet Take 4 tablets (40 mg total) by mouth daily for 5 days. 07/10/23 07/15/23 Yes Mannie Fairy DASEN, DO  aspirin  EC 81 MG tablet Take 81 mg by mouth at bedtime.    [provider]  baclofen (LIORESAL) 20 MG tablet Take 20 mg by mouth 3 (three) times daily as needed. 04/08/23   [provider]  carvedilol  (COREG ) 6.25 MG tablet Take 1 tablet (6.25 mg total) by mouth 2 (two) times daily with a meal. 10/27/19   Henry Shaver B, NP  cholecalciferol (VITAMIN D3) 25 MCG (1000 UNIT) tablet Take 1,000 Units by mouth daily.    [provider]  gabapentin (NEURONTIN) 600 MG tablet Take 600 mg by mouth 3 (three) times daily. 04/08/23   [provider]  hydrALAZINE  (APRESOLINE ) 25 MG tablet Take 1 tablet (25 mg total) by mouth 3 (three) times daily. 06/03/23   Parthenia Olivia HERO, PA-C  nitroGLYCERIN  (NITROSTAT ) 0.4 MG SL tablet Place 1 tablet under the tongue every 5 (five) minutes as needed for chest pain. Up to 3 doses. 10/20/19   [provider]  olmesartan (BENICAR) 40 MG tablet Take 40 mg by mouth daily. 10/21/19   [provider]  Oxycodone  HCl 10 MG TABS Take 10 mg by mouth 3 (three) times daily as needed. 05/08/23   [provider]  oxyCODONE -acetaminophen  (PERCOCET) 10-325 MG tablet Take 1 tablet by mouth 3 (three)  times daily.    [provider]  simvastatin  (ZOCOR ) 20 MG tablet Take 20 mg by mouth at bedtime. 05/03/23   [provider]  Varenicline  Tartrate, Starter, (CHANTIX  STARTING MONTH PAK) 0.5 MG X 11 & 1 MG X 42 TBPK Take 0.5 mg by mouth as directed. 06/03/23   Parthenia Olivia HERO, PA-C  XTAMPZA  ER 13.5 MG C12A Take 1 capsule by mouth 2 (two) times daily. 05/22/23   [provider]      Allergies    Wound dressing adhesive and Cymbalta [duloxetine hcl]    Review of Systems   Review of Systems  Respiratory:  Positive for shortness of breath.     Physical Exam Updated Vital Signs BP (!) 149/85   Pulse 74   Temp 98.2 F (36.8 C) (Oral)   Resp 18   Ht 5' 9 (1.753 m)   Wt 105.2 kg   SpO2 92%   BMI 34.26 kg/m  Physical Exam Vitals reviewed.  HENT:     Head: Normocephalic.  Cardiovascular:     Rate and Rhythm: Normal rate.  Pulmonary:     Effort: Pulmonary effort is normal.     Comments: Diffuse expiratory wheezing.  No accessory muscle use. Chest:     Chest wall: No mass or deformity.  Musculoskeletal:     Cervical back: Normal range of motion.  Neurological:  General: No focal deficit present.     Mental Status: He is alert.     ED Results / Procedures / Treatments   Labs (all labs ordered are listed, but only abnormal results are displayed) Labs Reviewed  RESP PANEL BY RT-PCR (RSV, FLU A&B, COVID)  RVPGX2 - Abnormal; Notable for the following components:      Result Value   Resp Syncytial Virus by PCR POSITIVE (*)    All other components within normal limits  CBC WITH DIFFERENTIAL/PLATELET - Abnormal; Notable for the following components:   Platelets 144 (*)    Monocytes Absolute 1.3 (*)    All other components within normal limits  BASIC METABOLIC PANEL    EKG None  Radiology DG Chest Portable 1 View Result Date: 07/10/2023 CLINICAL DATA:  Cough EXAM: PORTABLE CHEST 1 VIEW COMPARISON:  Chest x-ray 03/02/2022 FINDINGS: The heart size  and mediastinal contours are within normal limits. Both lungs are clear. The visualized skeletal structures are unremarkable. IMPRESSION: No active disease. Electronically Signed   By: Greig Pique M.D.   On: 07/10/2023 21:17    Procedures Procedures    Medications Ordered in ED Medications  methylPREDNISolone  sodium succinate (SOLU-MEDROL ) 125 mg/2 mL injection 125 mg (125 mg Intravenous Given 07/10/23 2034)  ipratropium-albuterol  (DUONEB) 0.5-2.5 (3) MG/3ML nebulizer solution 3 mL (3 mLs Nebulization Given 07/10/23 2131)  albuterol  (VENTOLIN  HFA) 108 (90 Base) MCG/ACT inhaler 2 puff (2 puffs Inhalation Given 07/10/23 2236)    ED Course/ Medical Decision Making/ A&P                                 Medical Decision Making 63 year old male here today with cough, congestion and shortness of breath.  Differential diagnose include viral syndrome, COPD exacerbation, pneumonia.  Plan-based on the patient's smoking history I believe he likely has COPD, undiagnosed.  Will treat him with steroids, DuoNebs.  Chest x-ray and labs ordered.  Viral panel ordered.  Patient's symptoms are consistent with a viral URI.  No pain.  Reassessment 10:20 PM-patient is positive for RSV.  He is feeling significantly better after receiving a DuoNeb and steroids.  Will provide patient with inhaler here.  Will discharge with prescription for steroids.  Amount and/or Complexity of Data Reviewed Labs: ordered. Radiology: ordered.  Risk Prescription drug management.           Final Clinical Impression(s) / ED Diagnoses Final diagnoses:  Viral URI    Rx / DC Orders ED Discharge Orders          Ordered    predniSONE  (DELTASONE ) 10 MG tablet  Daily        07/10/23 2244              Mannie Pac T, DO 07/10/23 2245

## 2023-07-10 NOTE — Discharge Instructions (Addendum)
 You are diagnosed with RSV.  There is a respiratory infection.  You can take 40 mg of prednisone  each day for the next 5 days.  You may begin taking this tomorrow.  I would also like you to use the albuterol  inhaler that you have 4 times per day.  If you feel you that you are short of breath, go ahead and use it.  Return to the emergency department if you develop increased difficulty with your breathing, pain in your chest, fever, or confusion.

## 2023-07-10 NOTE — ED Triage Notes (Signed)
 Pt states he has had a cold for a couple days but the SOB has gotten worse. He states he is having a hard time walking down the hallway d/t SOB, non productive cough

## 2023-07-11 DIAGNOSIS — Z79899 Other long term (current) drug therapy: Secondary | ICD-10-CM | POA: Diagnosis not present

## 2023-07-17 ENCOUNTER — Telehealth: Payer: Self-pay

## 2023-07-17 NOTE — Progress Notes (Signed)
 Transition Care Management Unsuccessful Follow-up Telephone Call  Date of discharge and from where:  07/10/2023 [facility]  Attempts:  1st Attempt  Reason for unsuccessful TCM follow-up call:  No answer/busy  [facility]  [facility] Guide Direct Dial: [phone]  Fax: [phone] Website: [email or web address]

## 2023-07-18 ENCOUNTER — Telehealth: Payer: Self-pay

## 2023-07-18 NOTE — Progress Notes (Signed)
Transition Care Management Follow-up Telephone Call Date of discharge and from where: 07/10/2023 [facility] How have you been since you were released from the hospital? Patient stated he is not feeling much better. He still has a lot of congestion. Any questions or concerns? No  Items Reviewed: Did the pt receive and understand the discharge instructions provided? Yes  Medications obtained and verified? Yes  Other? No  Any new allergies since your discharge? No  Dietary orders reviewed? Yes Do you have support at home? Yes   Follow up appointments reviewed:  PCP Hospital f/u appt confirmed?  Patient stated he has an upcoming appointment.  Scheduled to see  on  @ . Specialist Hospital f/u appt confirmed? No  Scheduled to see  on  @ . Are transportation arrangements needed? No  If their condition worsens, is the pt aware to call PCP or go to the Emergency Dept.? Yes Was the patient provided with contact information for the PCP's office or ED? Yes Was to pt encouraged to call back with questions or concerns? Yes   Venkat Ankney [facility]  [facility] Guide Direct Dial: [phone]  Fax: [phone] Website: [email or web address]

## 2023-07-23 DIAGNOSIS — B974 Respiratory syncytial virus as the cause of diseases classified elsewhere: Secondary | ICD-10-CM | POA: Diagnosis not present

## 2023-07-23 DIAGNOSIS — R0600 Dyspnea, unspecified: Secondary | ICD-10-CM | POA: Diagnosis not present

## 2023-07-23 DIAGNOSIS — J019 Acute sinusitis, unspecified: Secondary | ICD-10-CM | POA: Diagnosis not present

## 2023-09-05 ENCOUNTER — Emergency Department (HOSPITAL_COMMUNITY)
Admission: EM | Admit: 2023-09-05 | Discharge: 2023-09-05 | Disposition: A | Discharge status: Home / Self Care | Attending: Emergency Medicine | Admitting: Emergency Medicine

## 2023-09-05 ENCOUNTER — Other Ambulatory Visit: Payer: Self-pay

## 2023-09-05 ENCOUNTER — Emergency Department (HOSPITAL_COMMUNITY)

## 2023-09-05 ENCOUNTER — Encounter (HOSPITAL_COMMUNITY): Payer: Self-pay

## 2023-09-05 DIAGNOSIS — M549 Dorsalgia, unspecified: Secondary | ICD-10-CM | POA: Diagnosis not present

## 2023-09-05 DIAGNOSIS — I10 Essential (primary) hypertension: Secondary | ICD-10-CM

## 2023-09-05 DIAGNOSIS — R059 Cough, unspecified: Secondary | ICD-10-CM | POA: Insufficient documentation

## 2023-09-05 DIAGNOSIS — R110 Nausea: Secondary | ICD-10-CM | POA: Diagnosis not present

## 2023-09-05 DIAGNOSIS — R072 Precordial pain: Secondary | ICD-10-CM | POA: Diagnosis not present

## 2023-09-05 DIAGNOSIS — I714 Abdominal aortic aneurysm, without rupture, unspecified: Secondary | ICD-10-CM | POA: Insufficient documentation

## 2023-09-05 DIAGNOSIS — I2510 Atherosclerotic heart disease of native coronary artery without angina pectoris: Secondary | ICD-10-CM | POA: Diagnosis not present

## 2023-09-05 DIAGNOSIS — Z7982 Long term (current) use of aspirin: Secondary | ICD-10-CM | POA: Insufficient documentation

## 2023-09-05 DIAGNOSIS — R079 Chest pain, unspecified: Secondary | ICD-10-CM | POA: Diagnosis present

## 2023-09-05 DIAGNOSIS — R000 Tachycardia, unspecified: Secondary | ICD-10-CM | POA: Diagnosis not present

## 2023-09-05 DIAGNOSIS — J449 Chronic obstructive pulmonary disease, unspecified: Secondary | ICD-10-CM | POA: Diagnosis not present

## 2023-09-05 LAB — COMPREHENSIVE METABOLIC PANEL
ALT: 71 U/L — ABNORMAL HIGH (ref 0–44)
AST: 28 U/L (ref 15–41)
Albumin: 3.7 g/dL (ref 3.5–5.0)
Alkaline Phosphatase: 55 U/L (ref 38–126)
Anion gap: 13 (ref 5–15)
BUN: 13 mg/dL (ref 8–23)
CO2: 19 mmol/L — ABNORMAL LOW (ref 22–32)
Calcium: 9.1 mg/dL (ref 8.9–10.3)
Chloride: 108 mmol/L (ref 98–111)
Creatinine, Ser: 0.82 mg/dL (ref 0.61–1.24)
GFR, Estimated: >60 mL/min (ref >60)
Glucose, Bld: 249 mg/dL — ABNORMAL HIGH (ref 70–99)
Potassium: 4.2 mmol/L (ref 3.5–5.1)
Sodium: 140 mmol/L (ref 135–145)
Total Bilirubin: 0.6 mg/dL (ref 0.0–1.2)
Total Protein: 6.6 g/dL (ref 6.5–8.1)

## 2023-09-05 LAB — CBC WITH DIFFERENTIAL/PLATELET
Abs Immature Granulocytes: 0.07 10*3/uL (ref 0.00–0.07)
Basophils Absolute: 0 10*3/uL (ref 0.0–0.1)
Basophils Relative: 0 %
Eosinophils Absolute: 0 10*3/uL (ref 0.0–0.5)
Eosinophils Relative: 0 %
HCT: 45.8 % (ref 39.0–52.0)
Hemoglobin: 15.8 g/dL (ref 13.0–17.0)
Immature Granulocytes: 1 %
Lymphocytes Relative: 8 %
Lymphs Abs: 1.2 10*3/uL (ref 0.7–4.0)
MCH: 29.8 pg (ref 26.0–34.0)
MCHC: 34.5 g/dL (ref 30.0–36.0)
MCV: 86.3 fL (ref 80.0–100.0)
Monocytes Absolute: 0.8 10*3/uL (ref 0.1–1.0)
Monocytes Relative: 5 %
Neutro Abs: 12.9 10*3/uL — ABNORMAL HIGH (ref 1.7–7.7)
Neutrophils Relative %: 86 %
Platelets: 198 10*3/uL (ref 150–400)
RBC: 5.31 MIL/uL (ref 4.22–5.81)
RDW: 13.7 % (ref 11.5–15.5)
WBC: 14.9 10*3/uL — ABNORMAL HIGH (ref 4.0–10.5)
nRBC: 0 % (ref 0.0–0.2)

## 2023-09-05 LAB — I-STAT CHEM 8, ED
BUN: 15 mg/dL (ref 8–23)
BUN: 14 mg/dL (ref 8–23)
Calcium, Ion: 1.18 mmol/L (ref 1.15–1.40)
Calcium, Ion: 1.1 mmol/L — ABNORMAL LOW (ref 1.15–1.40)
Chloride: 111 mmol/L (ref 98–111)
Chloride: 110 mmol/L (ref 98–111)
Creatinine, Ser: 0.7 mg/dL (ref 0.61–1.24)
Creatinine, Ser: 0.7 mg/dL (ref 0.61–1.24)
Glucose, Bld: 239 mg/dL — ABNORMAL HIGH (ref 70–99)
Glucose, Bld: 239 mg/dL — ABNORMAL HIGH (ref 70–99)
HCT: 46 % (ref 39.0–52.0)
HCT: 46 % (ref 39.0–52.0)
Hemoglobin: 15.6 g/dL (ref 13.0–17.0)
Hemoglobin: 15.6 g/dL (ref 13.0–17.0)
Potassium: 4.3 mmol/L (ref 3.5–5.1)
Potassium: 4.2 mmol/L (ref 3.5–5.1)
Sodium: 143 mmol/L (ref 135–145)
Sodium: 143 mmol/L (ref 135–145)
TCO2: 21 mmol/L — ABNORMAL LOW (ref 22–32)
TCO2: 22 mmol/L (ref 22–32)

## 2023-09-05 LAB — LIPASE, BLOOD: Lipase: 36 U/L (ref 11–51)

## 2023-09-05 LAB — TROPONIN I (HIGH SENSITIVITY)
Troponin I (High Sensitivity): 13 ng/L (ref <18)
Troponin I (High Sensitivity): 18 ng/L — ABNORMAL HIGH (ref <18)

## 2023-09-05 LAB — POC OCCULT BLOOD, ED: Fecal Occult Bld: NEGATIVE

## 2023-09-05 MED ORDER — IRBESARTAN 300 MG PO TABS: 300.0000 mg | ORAL_TABLET | Freq: Every day | ORAL | Status: DC

## 2023-09-05 MED ORDER — CARVEDILOL 3.125 MG PO TABS
6.2500 mg | ORAL_TABLET | Freq: Two times a day (BID) | ORAL | Status: DC
  Administered 2023-09-05: 6.25 mg via ORAL
  Filled 2023-09-05: qty 2

## 2023-09-05 MED ORDER — HYDRALAZINE HCL 25 MG PO TABS
25.0000 mg | ORAL_TABLET | Freq: Three times a day (TID) | ORAL | Status: DC
  Administered 2023-09-05: 25 mg via ORAL
  Filled 2023-09-05: qty 1

## 2023-09-05 MED ORDER — OXYCODONE HCL 5 MG PO TABS
20.0000 mg | ORAL_TABLET | Freq: Once | ORAL | Status: AC | PRN
  Administered 2023-09-05: 20 mg via ORAL
  Filled 2023-09-05: qty 4

## 2023-09-05 MED ORDER — IOHEXOL 350 MG/ML SOLN
100.0000 mL | Freq: Once | INTRAVENOUS | Status: AC | PRN
  Administered 2023-09-05: 100 mL via INTRAVENOUS

## 2023-09-05 MED ORDER — IRBESARTAN 300 MG PO TABS
300.0000 mg | ORAL_TABLET | Freq: Every day | ORAL | Status: DC
  Administered 2023-09-05: 300 mg via ORAL
  Filled 2023-09-05: qty 1

## 2023-09-05 NOTE — Discharge Instructions (Signed)
 Your medical workup did not show signs of a heart attack or acute medical emergency.  However, you still need to follow-up with 2 specialist.  You should contact your cardiologist about the pain you been having in your chest.  A single normal workup in the ER does not rule out all serious causes of chest pain.  You also need to follow-up with a vascular specialist.  Your CT scan today showed that you have a blockage in the bypass graft in your left femoral artery.  There appears to signs of normal blood flow around the blockage, but this should be seen by a vascular surgeon.  Please call the number above to make an appointment.  You were given your blood pressure medications in the emergency department this morning as well as pain medicine, if needed.  You are also given aspirin by the ambulance service this morning.  You can resume your normal afternoon and evening medicines today.  Please make every effort to stop smoking.  Smoking can cause serious and worsening heart disease, vascular disease, stroke, lung disease, and other health problems.

## 2023-09-05 NOTE — ED Provider Notes (Signed)
 63 yo male w/ sig vascular history presenting with chest pain. Awaiting 2nd troponin.  Had CT dissection study with multiple incidental findings, but noting occlusion of bypass graft of left femoral artery with reconstitution - question of possible lower splenic infarct (?) not correlating with specific pain   Dr Madilyn Hook discussed case with on call vascular surgeon Dr Juanetta Gosling who reviewed the imaging and case - advised outpatient vascular follow up, no medication changes at this time.  Patient is noted to have distal DP pulses and warm, perfused lower extremities on exam - with chronic neuropathy pain of the lower extremities, unchanged from before, for which he takes chronic pain medications.  Hemoccult negative; hgb stable; low concern for acute GI bleeding  Physical Exam  BP (!) 154/81   Pulse (!) 101   Temp (!) 97 F (36.1 C) (Oral)   Resp 18   Ht 5\' 9"  (1.753 m)   Wt 105.2 kg   SpO2 96%   BMI 34.26 kg/m   Physical Exam  Procedures  Procedures  ED Course / MDM    Medical Decision Making Amount and/or Complexity of Data Reviewed Labs: ordered. Radiology: ordered.  Risk Prescription drug management.   - Re-ordered morning medications for BP - Re-ordered home oxycodone, reviewed PDMP, last filed 1 month ago for 20 mg roxicodone - HR, BP improving with home medications given - Repeat troponin 13 -> 18, no significant increase. - Patient remaining pain free, okay for discharge - Outpatient follow up instructions provided        Terald Sleeper, MD 09/05/23 [phone]

## 2023-09-05 NOTE — ED Triage Notes (Signed)
 BIB EMS from home for chest pain and diaphoresis.  Patient reports he was sob and clammy.  Pt recovering from RSV but hx of MI and AAA.  Reports pain radiated to shoulder and back of neck. Patient does have wheezing on arrival with hx of COPD. +nausea Initially was hypertensive in the 200s after nitroglycerin pain improved as well as BP

## 2023-09-05 NOTE — ED Provider Notes (Signed)
 [place] EMERGENCY DEPARTMENT AT [facility] Provider Note   CSN: [id number] Arrival date & time: 09/05/23  0416     History  Chief Complaint  Patient presents with   Chest Pain    Anthony Todd. is a 63 y.o. male.  The history is provided by the patient and medical records.  Chest Pain Anthony Todd. is a 63 y.o. male who presents to the Emergency Department complaining of chest pain.  He presents to the emergency department by EMS for evaluation of chest pain.  He had an injection in his left buttocks earlier today at pain management and when he arrived home around 4 or 5 he felt a little clammy and unwell.  This evening he developed substernal chest pain described as a tightness in his clamminess worsen.  He continued to check his blood pressure and it continued to go higher, which prompted him to call EMS.  He has had a lingering cough after recovering from RSV.  No significant shortness of breath.  He has chronic back pain, unchanged from baseline.  No significant shortness of breath.  No fevers.  He has nausea but no vomiting.  He has a history of prior MI, aortic aneurysm.  He received 325 aspirin by EMS as well as sublingual nitro.  Pain is completely resolved at time of ED assessment.     Home Medications Prior to Admission medications   Medication Sig Start Date End Date Taking? Authorizing Provider  aspirin EC 81 MG tablet Take 81 mg by mouth at bedtime.    [provider]  baclofen (LIORESAL) 20 MG tablet Take 20 mg by mouth 3 (three) times daily as needed. 04/08/23   [provider]  carvedilol (COREG) 6.25 MG tablet Take 1 tablet (6.25 mg total) by mouth 2 (two) times daily with a meal. 10/27/19   Laverda Page B, NP  cholecalciferol (VITAMIN D3) 25 MCG (1000 UNIT) tablet Take 1,000 Units by mouth daily.    [provider]  gabapentin (NEURONTIN) 600 MG tablet Take 600 mg by mouth 3 (three) times daily. 04/08/23    [provider]  hydrALAZINE (APRESOLINE) 25 MG tablet Take 1 tablet (25 mg total) by mouth 3 (three) times daily. 06/03/23   Dyann Kief, PA-C  nitroGLYCERIN (NITROSTAT) 0.4 MG SL tablet Place 1 tablet under the tongue every 5 (five) minutes as needed for chest pain. Up to 3 doses. 10/20/19   [provider]  olmesartan (BENICAR) 40 MG tablet Take 40 mg by mouth daily. 10/21/19   [provider]  Oxycodone HCl 10 MG TABS Take 10 mg by mouth 3 (three) times daily as needed. 05/08/23   [provider]  oxyCODONE-acetaminophen (PERCOCET) 10-325 MG tablet Take 1 tablet by mouth 3 (three) times daily.    [provider]  simvastatin (ZOCOR) 20 MG tablet Take 20 mg by mouth at bedtime. 05/03/23   [provider]  Varenicline Tartrate, Starter, (CHANTIX STARTING MONTH PAK) 0.5 MG X 11 & 1 MG X 42 TBPK Take 0.5 mg by mouth as directed. 06/03/23   Dyann Kief, PA-C  XTAMPZA ER 13.5 MG C12A Take 1 capsule by mouth 2 (two) times daily. 05/22/23   [provider]      Allergies    Wound dressing adhesive and Cymbalta [duloxetine hcl]    Review of Systems   Review of Systems  Cardiovascular:  Positive for chest pain.  All other systems reviewed  and are negative.   Physical Exam Updated Vital Signs BP (!) 154/81   Pulse (!) 101   Temp (!) 97 F (36.1 C) (Oral)   Resp 18   Ht 5\' 9"  (1.753 m)   Wt 105.2 kg   SpO2 96%   BMI 34.26 kg/m  Physical Exam Vitals and nursing note reviewed.  Constitutional:      Appearance: He is well-developed.  HENT:     Head: Normocephalic and atraumatic.  Cardiovascular:     Rate and Rhythm: Regular rhythm. Tachycardia present.     Heart sounds: No murmur heard. Pulmonary:     Effort: Pulmonary effort is normal. No respiratory distress.     Breath sounds: Normal breath sounds.  Abdominal:     Palpations: Abdomen is soft.     Tenderness: There is no abdominal tenderness. There is no guarding  or rebound.  Musculoskeletal:        General: No swelling or tenderness.     Comments: 2+ DP pulses bilaterally  Skin:    General: Skin is warm and dry.  Neurological:     Mental Status: He is alert and oriented to person, place, and time.  Psychiatric:        Behavior: Behavior normal.     ED Results / Procedures / Treatments   Labs (all labs ordered are listed, but only abnormal results are displayed) Labs Reviewed  COMPREHENSIVE METABOLIC PANEL - Abnormal; Notable for the following components:      Result Value   CO2 19 (*)    Glucose, Bld 249 (*)    ALT 71 (*)    All other components within normal limits  CBC WITH DIFFERENTIAL/PLATELET - Abnormal; Notable for the following components:   WBC 14.9 (*)    Neutro Abs 12.9 (*)    All other components within normal limits  I-STAT CHEM 8, ED - Abnormal; Notable for the following components:   Glucose, Bld 239 (*)    TCO2 21 (*)    All other components within normal limits  LIPASE, BLOOD  TROPONIN I (HIGH SENSITIVITY)  TROPONIN I (HIGH SENSITIVITY)    EKG None  Radiology CT Angio Chest/Abd/Pel for Dissection W and/or W/WO Result Date: 09/05/2023 CLINICAL DATA:  Chest pain and hypertension. Clinical concern for acute aortic syndrome. EXAM: CT ANGIOGRAPHY CHEST, ABDOMEN AND PELVIS TECHNIQUE: Non-contrast CT of the chest was initially obtained. Multidetector CT imaging through the chest, abdomen and pelvis was performed using the standard protocol during bolus administration of intravenous contrast. Multiplanar reconstructed images and MIPs were obtained and reviewed to evaluate the vascular anatomy. RADIATION DOSE REDUCTION: This exam was performed according to the departmental dose-optimization program which includes automated exposure control, adjustment of the mA and/or kV according to patient size and/or use of iterative reconstruction technique. CONTRAST:  OMNIPAQUE IOHEXOL 350 MG/ML SOLN COMPARISON:  CTA abdomen and  pelvis 09/21/2021 FINDINGS: CTA CHEST FINDINGS Cardiovascular: Pre contrast imaging shows no hyperdense crescent in the wall of the thoracic aorta to suggest the presence of an acute intramural hematoma. Imaging after IV contrast administration shows no thoracic aortic aneurysm. No dissection of the thoracic aorta. Coronary artery calcification is evident. Mild atherosclerotic calcification is noted in the wall of the thoracic aorta. No large central pulmonary embolus in the main pulmonary arteries or lobar pulmonary arteries. Mediastinum/Nodes: No mediastinal lymphadenopathy. There is no hilar lymphadenopathy. The esophagus has normal imaging features. There is no axillary lymphadenopathy. Lungs/Pleura: Centrilobular and paraseptal emphysema evident. 3 mm  right upper lobe pulmonary nodule identified on 28/7. No suspicious pulmonary nodule or mass. No focal airspace consolidation. There is no evidence of pleural effusion. Musculoskeletal: No worrisome lytic or sclerotic osseous abnormality. Review of the MIP images confirms the above findings. CTA ABDOMEN AND PELVIS FINDINGS VASCULAR Aorta: Moderate atherosclerotic disease. Infrarenal fusiform dilatation up to 3.4 x 3.1 cm. No evidence for dissection. Celiac: Patent without evidence of aneurysm, dissection, vasculitis or significant stenosis. SMA: Patent without evidence of aneurysm, dissection, vasculitis or significant stenosis. Renals: Both renal arteries are patent without evidence of aneurysm, dissection, vasculitis, fibromuscular dysplasia or significant stenosis. IMA: Patent without evidence of aneurysm, dissection, vasculitis or significant stenosis. Inflow: Chronic occlusion left common iliac artery. The short segment abrupt narrowing at the origin of the right common iliac artery seen on the previous study is similar today. Previously described at severe (greater than 70%) stenosis, imaging features are unchanged in the interval. Interval occlusion of the  right to left by femoral bypass graft. There is reconstitution of the left common femoral artery via collaterals. Veins: No obvious venous abnormality within the limitations of this arterial phase study. Review of the MIP images confirms the above findings. NON-VASCULAR Hepatobiliary: No suspicious focal abnormality within the liver parenchyma. There is no evidence for gallstones, gallbladder wall thickening, or pericholecystic fluid. No intrahepatic or extrahepatic biliary dilation. Pancreas: No focal mass lesion. No dilatation of the main duct. No intraparenchymal cyst. No peripancreatic edema. Spleen: No splenomegaly. Heterogeneous perfusion of splenic parenchyma evident with decreased perfusion inferolaterally. While this is likely related to early bolus timing, inferior splenic infarct cannot be excluded. Adrenals/Urinary Tract: No substantial change 3.9 cm benign right adrenal adenoma. No followup imaging is recommended. Left adrenal gland unremarkable. Kidneys unremarkable. No evidence for hydroureter. The urinary bladder appears normal for the degree of distention. Stomach/Bowel: Stomach is unremarkable. No gastric wall thickening. No evidence of outlet obstruction. Duodenum is normally positioned as is the ligament of Treitz. No small bowel wall thickening. No small bowel dilatation. The terminal ileum is normal. The appendix is normal. No gross colonic mass. No colonic wall thickening. Lymphatic: There is no gastrohepatic or hepatoduodenal ligament lymphadenopathy. No retroperitoneal or mesenteric lymphadenopathy. No pelvic sidewall lymphadenopathy. Reproductive: The prostate gland is upper normal to mildly increased for size. Other: No intraperitoneal free fluid. Musculoskeletal: No worrisome lytic or sclerotic osseous abnormality. Status post lumbar fusion. Review of the MIP images confirms the above findings. IMPRESSION: 1. No evidence for thoracoabdominal aortic aneurysm. No dissection of the  thoracoabdominal aorta. 2. Infrarenal fusiform dilatation of the abdominal aorta up to 3.4 x 3.1 cm. This is not substantially changed since the 2023 exam. Recommend follow-up ultrasound every 3 years. (Ref.: J Vasc Surg. 2018; 67:2-77 and J Am Coll Radiol 2013;10(10):789-794.) 3. Focal short segment severe stenosis of the proximal right common iliac artery, similar to prior. Chronic left common iliac artery occlusion. 4. Interval occlusion of the right to left bifemoral bypass graft. There is reconstitution of the left common femoral artery via collaterals. 5. Heterogeneous perfusion of splenic parenchyma with decreased perfusion inferolaterally. While this is likely related to early bolus timing, inferior splenic infarct cannot be excluded. 6. No substantial change in 3.9 cm benign right adrenal adenoma. No followup imaging is recommended. 7. 3 mm right upper lobe pulmonary nodule. No follow-up needed if patient is low-risk.This recommendation follows the consensus statement: Guidelines for Management of Incidental Pulmonary Nodules Detected on CT Images: From the Fleischner Society 2017; Radiology 2017; 284:228-243. Electronically Signed  By: Kennith Center M.D.   On: 09/05/2023 06:16   DG Chest Port 1 View Result Date: 09/05/2023 CLINICAL DATA:  63 year old male with chest pain. EXAM: PORTABLE CHEST 1 VIEW COMPARISON:  Portable chest 07/10/2023 and earlier. FINDINGS: Portable AP semi upright view at 0438 hours. Normal cardiac size and mediastinal contours. Lung volumes are at the upper limits of normal. Allowing for portable technique the lungs are clear. Visualized tracheal air column is within normal limits. No acute osseous abnormality identified. Paucity of bowel gas the visible abdomen. IMPRESSION: Negative portable chest. Electronically Signed   By: Odessa Fleming M.D.   On: 09/05/2023 04:57    Procedures Procedures    Medications Ordered in ED Medications  iohexol (OMNIPAQUE) 350 MG/ML injection 100  mL (100 mLs Intravenous Contrast Given 09/05/23 0540)    ED Course/ Medical Decision Making/ A&P                                 Medical Decision Making Amount and/or Complexity of Data Reviewed Labs: ordered. Radiology: ordered.  Risk Prescription drug management.   Patient with history of coronary artery disease status post stent, peripheral arterial disease, COPD here for evaluation of chest pain.  He did have elevated blood pressures for up to 200 systolic.  His pain is completely resolved after receiving prehospital aspirin and nitroglycerin.  He is mildly tachycardic.  He did receive an IM injection at pain management, unclear what this medication was but suspect it may have been a steroid he does have mild tachycardia in the emergency department but is symptom-free.  Lungs are clear bilaterally.  There is no respiratory distress.  EKG is nonischemic troponin is negative.  Given his chest pain as well as symptoms that radiate to his back and abdomen a CTA was obtained, which is negative for dissection.  He demonstrates stable changes.  He does have occlusion of his graft with reconstitution via collaterals.  He is perfused in his extremities.  Patient care transferred pending a repeat troponin.       Final Clinical Impression(s) / ED Diagnoses Final diagnoses:  None    Rx / DC Orders ED Discharge Orders     None         Tilden Fossa, MD 09/05/23 [phone]

## 2023-10-08 ENCOUNTER — Encounter

## 2023-10-21 NOTE — Progress Notes (Deleted)
 [facility] [place]  Patient name: Anthony Todd. MRN: [id number] DOB: August 06, 1960 Sex: male  REASON FOR VISIT: Follow-up infrarenal abdominal aortic aneurysm  HPI: Anthony Todd. is a 63 y.o. male here today for follow-up.  He has extensive past history.  He presented with limiting claudication and left common iliac artery occlusion.  He underwent right to left femorofemoral bypass with Dr. Nolene Baumgarten on 09/14/2019.  He had difficulty with scrotal swelling following the procedure which eventually resolved.  He reports that he began having difficulty with cardiac arrhythmia and also ischemia following his femorofemoral bypass and is convinced that he had something "break off" when he had his femorofemoral bypass that caused his cardiac issues.  He has known infrarenal abdominal aortic aneurysm and underwent recent CT scan and is here today for discussion of this.  Past Medical History:  Diagnosis Date   AAA (abdominal aortic aneurysm) (HCC)    Back pain    Complication of anesthesia    sometimes sruggle waking up   Coronary artery disease    Hyperlipidemia    Hypertension    Myocardial infarction [facility])    PAD (peripheral artery disease) (HCC)    Stenosis of iliac artery (HCC)     Family History  Problem Relation Age of Onset   Diabetes Mother    Cancer Father     SOCIAL HISTORY: Social History   Tobacco Use   Smoking status: Every Day    Current packs/day: 1.50    Average packs/day: 1.5 packs/day for 50.0 years (75.0 ttl pk-yrs)    Types: Cigarettes   Smokeless tobacco: Never  Substance Use Topics   Alcohol  use: Not Currently    Allergies  Allergen Reactions   Wound Dressing Adhesive Hives and Other (See Comments)    Adhesive from a BUPRENORPHINE patch   Cymbalta [Duloxetine Hcl] Rash    Current Outpatient Medications  Medication Sig Dispense Refill   albuterol  (VENTOLIN  HFA) 108 (90 Base) MCG/ACT  inhaler Inhale 2 puffs into the lungs every 4 (four) hours as needed for wheezing or shortness of breath.     aspirin  EC 81 MG tablet Take 81 mg by mouth at bedtime.     baclofen (LIORESAL) 20 MG tablet Take 20 mg by mouth 3 (three) times daily as needed for muscle spasms.     carvedilol  (COREG ) 6.25 MG tablet Take 1 tablet (6.25 mg total) by mouth 2 (two) times daily with a meal. 180 tablet 0   cholecalciferol (VITAMIN D3) 25 MCG (1000 UNIT) tablet Take 1,000 Units by mouth daily.     gabapentin (NEURONTIN) 600 MG tablet Take 600 mg by mouth at bedtime.     hydrALAZINE  (APRESOLINE ) 25 MG tablet Take 1 tablet (25 mg total) by mouth 3 (three) times daily. (Patient taking differently: Take 25 mg by mouth in the morning and at bedtime.) 90 tablet 6   nitroGLYCERIN  (NITROSTAT ) 0.4 MG SL tablet Place 1 tablet under the tongue every 5 (five) minutes as needed for chest pain. Up to 3 doses.     olmesartan (BENICAR) 40 MG tablet Take 40 mg by mouth daily.     Oxycodone  HCl 20 MG TABS Take 1 tablet by mouth 3 (three) times daily as needed.     rosuvastatin  (CRESTOR ) 20 MG tablet Take 20 mg by mouth at bedtime.     simvastatin  (ZOCOR ) 20 MG tablet Take 20 mg by mouth at bedtime.     varenicline  (CHANTIX )  1 MG tablet Take 1 mg by mouth 2 (two) times daily.     No current facility-administered medications for this visit.    REVIEW OF SYSTEMS:  [X]  denotes positive finding, [ ]  denotes negative finding Cardiac  Comments:  Chest pain or chest pressure:    Shortness of breath upon exertion:    Short of breath when lying flat:    Irregular heart rhythm:        Vascular    Pain in calf, thigh, or hip brought on by ambulation:    Pain in feet at night that wakes you up from your sleep:     Blood clot in your veins:    Leg swelling:           PHYSICAL EXAM: There were no vitals filed for this visit.   GENERAL: The patient is a well-nourished male, in no acute distress. The vital signs are  documented above. CARDIOVASCULAR: 2+ radial 2+ popliteal and 2+ dorsalis pedis pulses bilaterally. PULMONARY: There is good air exchange  MUSCULOSKELETAL: There are no major deformities or cyanosis. NEUROLOGIC: No focal weakness or paresthesias are detected. SKIN: There are no ulcers or rashes noted. PSYCHIATRIC: The patient has a normal affect.  DATA:  CT scan from 09/21/2021 was reviewed with the patient.  This shows patency of his femorofemoral bypass with no evidence of pseudoaneurysm or other issues.  He has an occluded left iliac artery.  He does have a 3.2 cm infrarenal abdominal aortic aneurysm  MEDICAL ISSUES: Had a very long discussion with the patient regarding his aneurysm.  I explained that this is a very small aneurysm and would recommend ultrasound follow-up in 2 years to rule out progression in size.  Explained that we would recommend surgery if his aneurysm grew to the 5 and or 5-1/2 cm level unless it grew rapidly.  So discussed his prior femorofemoral bypass and explained that it is physically impossible for something to break off from the femoral artery and go to the heart.  I explained that it is certainly possible that he had new onset of cardiac difficulties around the time of his surgery.  We will see him again in 2 years with duplex    Mayo Speck, MD FACS [facility] [phone]  Note: Portions of this report may have been transcribed using voice recognition software.  Every effort has been made to ensure accuracy; however, inadvertent computerized transcription errors may still be present.

## 2023-10-22 ENCOUNTER — Other Ambulatory Visit: Payer: Self-pay

## 2023-10-22 ENCOUNTER — Ambulatory Visit: Admitting: Vascular Surgery

## 2023-10-22 ENCOUNTER — Encounter

## 2023-10-22 DIAGNOSIS — I70213 Atherosclerosis of native arteries of extremities with intermittent claudication, bilateral legs: Secondary | ICD-10-CM

## 2023-11-14 ENCOUNTER — Other Ambulatory Visit: Payer: Self-pay

## 2023-11-14 ENCOUNTER — Encounter (HOSPITAL_COMMUNITY): Payer: Self-pay | Admitting: Emergency Medicine

## 2023-11-14 ENCOUNTER — Emergency Department (HOSPITAL_COMMUNITY)

## 2023-11-14 ENCOUNTER — Emergency Department (HOSPITAL_COMMUNITY)
Admission: EM | Admit: 2023-11-14 | Discharge: 2023-11-15 | Discharge status: Against Medical Advice | Attending: Emergency Medicine | Admitting: Emergency Medicine

## 2023-11-14 DIAGNOSIS — Z79899 Other long term (current) drug therapy: Secondary | ICD-10-CM | POA: Insufficient documentation

## 2023-11-14 DIAGNOSIS — I739 Peripheral vascular disease, unspecified: Secondary | ICD-10-CM | POA: Insufficient documentation

## 2023-11-14 DIAGNOSIS — I2510 Atherosclerotic heart disease of native coronary artery without angina pectoris: Secondary | ICD-10-CM | POA: Insufficient documentation

## 2023-11-14 DIAGNOSIS — Z5329 Procedure and treatment not carried out because of patient's decision for other reasons: Secondary | ICD-10-CM | POA: Insufficient documentation

## 2023-11-14 DIAGNOSIS — R531 Weakness: Secondary | ICD-10-CM | POA: Diagnosis not present

## 2023-11-14 DIAGNOSIS — R200 Anesthesia of skin: Secondary | ICD-10-CM

## 2023-11-14 DIAGNOSIS — Z7982 Long term (current) use of aspirin: Secondary | ICD-10-CM | POA: Diagnosis not present

## 2023-11-14 DIAGNOSIS — R4781 Slurred speech: Secondary | ICD-10-CM | POA: Insufficient documentation

## 2023-11-14 DIAGNOSIS — E785 Hyperlipidemia, unspecified: Secondary | ICD-10-CM

## 2023-11-14 DIAGNOSIS — I10 Essential (primary) hypertension: Secondary | ICD-10-CM | POA: Diagnosis not present

## 2023-11-14 DIAGNOSIS — R29898 Other symptoms and signs involving the musculoskeletal system: Secondary | ICD-10-CM

## 2023-11-14 LAB — COMPREHENSIVE METABOLIC PANEL WITH GFR
ALT: 30 U/L (ref 0–44)
AST: 36 U/L (ref 15–41)
Albumin: 3.9 g/dL (ref 3.5–5.0)
Alkaline Phosphatase: 59 U/L (ref 38–126)
Anion gap: 10 (ref 5–15)
BUN: 26 mg/dL — ABNORMAL HIGH (ref 8–23)
CO2: 20 mmol/L — ABNORMAL LOW (ref 22–32)
Calcium: 8.9 mg/dL (ref 8.9–10.3)
Chloride: 108 mmol/L (ref 98–111)
Creatinine, Ser: 1.25 mg/dL — ABNORMAL HIGH (ref 0.61–1.24)
GFR, Estimated: >60 mL/min (ref >60)
Glucose, Bld: 103 mg/dL — ABNORMAL HIGH (ref 70–99)
Potassium: 4.5 mmol/L (ref 3.5–5.1)
Sodium: 138 mmol/L (ref 135–145)
Total Bilirubin: 0.9 mg/dL (ref 0.0–1.2)
Total Protein: 6.5 g/dL (ref 6.5–8.1)

## 2023-11-14 LAB — CBG MONITORING, ED
Glucose-Capillary: 112 mg/dL — ABNORMAL HIGH (ref 70–99)
Glucose-Capillary: 111 mg/dL — ABNORMAL HIGH (ref 70–99)

## 2023-11-14 LAB — I-STAT CHEM 8, ED
BUN: 28 mg/dL — ABNORMAL HIGH (ref 8–23)
Calcium, Ion: 1.17 mmol/L (ref 1.15–1.40)
Chloride: 108 mmol/L (ref 98–111)
Creatinine, Ser: 1.2 mg/dL (ref 0.61–1.24)
Glucose, Bld: 102 mg/dL — ABNORMAL HIGH (ref 70–99)
HCT: 43 % (ref 39.0–52.0)
Hemoglobin: 14.6 g/dL (ref 13.0–17.0)
Potassium: 4.4 mmol/L (ref 3.5–5.1)
Sodium: 140 mmol/L (ref 135–145)
TCO2: 21 mmol/L — ABNORMAL LOW (ref 22–32)

## 2023-11-14 LAB — DIFFERENTIAL
Abs Immature Granulocytes: 0.04 10*3/uL (ref 0.00–0.07)
Basophils Absolute: 0.1 10*3/uL (ref 0.0–0.1)
Basophils Relative: 1 %
Eosinophils Absolute: 0.5 10*3/uL (ref 0.0–0.5)
Eosinophils Relative: 4 %
Immature Granulocytes: 0 %
Lymphocytes Relative: 20 %
Lymphs Abs: 2.5 10*3/uL (ref 0.7–4.0)
Monocytes Absolute: 1.3 10*3/uL — ABNORMAL HIGH (ref 0.1–1.0)
Monocytes Relative: 10 %
Neutro Abs: 8.3 10*3/uL — ABNORMAL HIGH (ref 1.7–7.7)
Neutrophils Relative %: 65 %

## 2023-11-14 LAB — CBC
HCT: 43.3 % (ref 39.0–52.0)
Hemoglobin: 14.7 g/dL (ref 13.0–17.0)
MCH: 30.6 pg (ref 26.0–34.0)
MCHC: 33.9 g/dL (ref 30.0–36.0)
MCV: 90.2 fL (ref 80.0–100.0)
Platelets: 188 10*3/uL (ref 150–400)
RBC: 4.8 MIL/uL (ref 4.22–5.81)
RDW: 13.6 % (ref 11.5–15.5)
WBC: 12.8 10*3/uL — ABNORMAL HIGH (ref 4.0–10.5)
nRBC: 0 % (ref 0.0–0.2)

## 2023-11-14 LAB — ETHANOL: Alcohol, Ethyl (B): <15 mg/dL (ref <15)

## 2023-11-14 MED ORDER — IOHEXOL 350 MG/ML SOLN
75.0000 mL | Freq: Once | INTRAVENOUS | Status: AC | PRN
  Administered 2023-11-14: 75 mL via INTRAVENOUS

## 2023-11-14 MED ORDER — SODIUM CHLORIDE 0.9% FLUSH: 3.0000 mL | Freq: Once | INTRAVENOUS | Status: DC

## 2023-11-14 NOTE — ED Triage Notes (Signed)
 Pt in with L side facial droop, slurred speech and L arm weakness/numbness - wife present during triage, states they were eating dinner and he was normal self until 1930 when these symptoms began. Wife also states he was recently prescribed Oxycodone  for back pain - always has L side leaning due to neck torticollis and back issues per wife

## 2023-11-14 NOTE — ED Notes (Signed)
 Pt taken to CT1 with Antony Baumgartner, RN

## 2023-11-14 NOTE — Consult Note (Signed)
 NEUROLOGY CONSULT NOTE   Date of service: Nov 14, 2023 Patient Name: Anthony Todd [facility] MRN:  [id number] DOB:  29-May-1961 Chief Complaint: "Slurred speech" Requesting Provider: Guadalupe Lilton, MD  History of Present Illness  Anthony Todd. is a 63 y.o. male with hx of hypertension, hyperlipidemia, coronary artery disease who presents with slurred speech and left facial weakness that started abruptly while eating dinner.  It started sometime around 7 PM and therefore they sought care in the emergency department.  His wife also states that he was having slightly more difficulty walking than he typically does.  Due to the symptoms, code stroke was activated and he was taken emergently to CT.  Head CT was negative.   The patient is less concerned about slurred speech and more complained about pain and numbness that has been ongoing in his legs.  This has been happening for a couple of days.  Of note, he did take some pain medication around seven with these deficits being noticed around 730.  LKW: Couple of days ago, slurred speech started today IV Thrombolysis: No, outside of window EVT: No, no LVO  NIHSS components Score: Comment  1a Level of Conscious 0[x]  1[]  2[]  3[]      1b LOC Questions 0[x]  1[]  2[]       1c LOC Commands 0[x]  1[]  2[]       2 Best Gaze 0[x]  1[]  2[]       3 Visual 0[x]  1[]  2[]  3[]      4 Facial Palsy 0[]  1[x]  2[]  3[]      5a Motor Arm - left 0[x]  1[]  2[]  3[]  4[]  UN[]    5b Motor Arm - Right 0[x]  1[]  2[]  3[]  4[]  UN[]    6a Motor Leg - Left 0[]  1[x]  2[]  3[]  4[]  UN[]    6b Motor Leg - Right 0[]  1[x]  2[]  3[]  4[]  UN[]    7 Limb Ataxia 0[x]  1[]  2[]  UN[]      8 Sensory 0[]  1[x]  2[]  UN[]      9 Best Language 0[x]  1[]  2[]  3[]      10 Dysarthria 0[]  1[x]  2[]  UN[]      11 Extinct. and Inattention 0[x]  1[]  2[]       TOTAL: 5     Past History   Past Medical History:  Diagnosis Date   AAA (abdominal aortic aneurysm) (HCC)    Back pain    Complication of anesthesia    sometimes  sruggle waking up   Coronary artery disease    Hyperlipidemia    Hypertension    Myocardial infarction [facility])    PAD (peripheral artery disease) (HCC)    Stenosis of iliac artery (HCC)     Past Surgical History:  Procedure Laterality Date   ABDOMINAL AORTOGRAM W/LOWER EXTREMITY N/A 06/05/2019   Procedure: ABDOMINAL AORTOGRAM W/LOWER EXTREMITY;  Surgeon: Richrd Char, MD;  Location: [facility] INVASIVE CV LAB;  Service: Cardiovascular;  Laterality: N/A;   BACK SURGERY     CORONARY STENT INTERVENTION N/A 10/26/2019   Procedure: CORONARY STENT INTERVENTION;  Surgeon: Swaziland, Peter M, MD;  Location: [facility] INVASIVE CV LAB;  Service: Cardiovascular;  Laterality: N/A;   FEMORAL ARTERY - FEMORAL ARTERY BYPASS GRAFT  09/14/2019   FEMORAL-FEMORAL BYPASS GRAFT Bilateral 09/14/2019   Procedure: LEFT TO RIGHT BYPASS GRAFT FEMORAL-FEMORAL ARTERY USING HEMASHIELD GOLD GRAFT;  Surgeon: Richrd Char, MD;  Location: [facility] OR;  Service: Vascular;  Laterality: Bilateral;   I & D EXTREMITY Left 03/03/2022   Procedure: IRRIGATION AND DEBRIDEMENT/AMPUTATION;  Surgeon: Osa Blase, MD;  Location: [facility] OR;  Service: Orthopedics;  Laterality: Left;   LEFT HEART CATH AND CORONARY ANGIOGRAPHY N/A 10/26/2019   Procedure: LEFT HEART CATH AND CORONARY ANGIOGRAPHY;  Surgeon: Swaziland, Peter M, MD;  Location: [facility] INVASIVE CV LAB;  Service: Cardiovascular;  Laterality: N/A;   PERIPHERAL VASCULAR BALLOON ANGIOPLASTY Left 06/05/2019   Procedure: PERIPHERAL VASCULAR BALLOON ANGIOPLASTY;  Surgeon: Richrd Char, MD;  Location: [facility] INVASIVE CV LAB;  Service: Cardiovascular;  Laterality: Left;  Iliac   TONSILLECTOMY      Family History: Family History  Problem Relation Age of Onset   Diabetes Mother    Cancer Father     Social History  reports that he has been smoking cigarettes. He has a 75 pack-year smoking history. He has never used smokeless tobacco. He reports that he does not currently use alcohol . He reports that he does  not currently use drugs.  Allergies  Allergen Reactions   Wound Dressing Adhesive Hives and Other (See Comments)    Adhesive from a BUPRENORPHINE patch   Cymbalta [Duloxetine Hcl] Rash    Medications   Current Facility-Administered Medications:    sodium chloride  flush (NS) 0.9 % injection 3 mL, 3 mL, Intravenous, Once, Guadalupe Adoni, MD  Current Outpatient Medications:    albuterol  (VENTOLIN  HFA) 108 (90 Base) MCG/ACT inhaler, Inhale 2 puffs into the lungs every 4 (four) hours as needed for wheezing or shortness of breath., Disp: , Rfl:    aspirin  EC 81 MG tablet, Take 81 mg by mouth at bedtime., Disp: , Rfl:    baclofen (LIORESAL) 20 MG tablet, Take 20 mg by mouth 3 (three) times daily as needed for muscle spasms., Disp: , Rfl:    carvedilol  (COREG ) 6.25 MG tablet, Take 1 tablet (6.25 mg total) by mouth 2 (two) times daily with a meal., Disp: 180 tablet, Rfl: 0   cholecalciferol (VITAMIN D3) 25 MCG (1000 UNIT) tablet, Take 1,000 Units by mouth daily., Disp: , Rfl:    gabapentin (NEURONTIN) 600 MG tablet, Take 600 mg by mouth at bedtime., Disp: , Rfl:    hydrALAZINE  (APRESOLINE ) 25 MG tablet, Take 1 tablet (25 mg total) by mouth 3 (three) times daily. (Patient taking differently: Take 25 mg by mouth in the morning and at bedtime.), Disp: 90 tablet, Rfl: 6   nitroGLYCERIN  (NITROSTAT ) 0.4 MG SL tablet, Place 1 tablet under the tongue every 5 (five) minutes as needed for chest pain. Up to 3 doses., Disp: , Rfl:    olmesartan (BENICAR) 40 MG tablet, Take 40 mg by mouth daily., Disp: , Rfl:    Oxycodone  HCl 20 MG TABS, Take 1 tablet by mouth 3 (three) times daily as needed., Disp: , Rfl:    rosuvastatin  (CRESTOR ) 20 MG tablet, Take 20 mg by mouth at bedtime., Disp: , Rfl:    simvastatin  (ZOCOR ) 20 MG tablet, Take 20 mg by mouth at bedtime., Disp: , Rfl:    varenicline  (CHANTIX ) 1 MG tablet, Take 1 mg by mouth 2 (two) times daily., Disp: , Rfl:   Vitals   Vitals:   11/14/23 2211 11/14/23  2226  BP: 108/65   Pulse: 84   Resp: 14   Temp: 99.1 F (37.3 C)   SpO2: 93%   Weight:  107 kg    Body mass index is 34.85 kg/m.  Physical Exam   Constitutional: Appears well-developed and well-nourished.   Neurologic Examination    Neuro: Mental Status: Patient is awake, alert, oriented to person, place, month, year, and  situation. Patient is able to give a clear and coherent history. No signs of aphasia or neglect.  He does have some dysarthria Cranial Nerves: II: Visual Fields are full. Pupils are equal, round, and reactive to light.   III,IV, VI: EOMI without ptosis or diploplia.  V: Facial sensation is symmetric to temperature VII: Facial movement is mildly asymmetric with the left lower than the right VIII: hearing is intact to voice X: Uvula elevates symmetrically XII: tongue is midline without atrophy or fasciculations.  Motor: Tone is normal. Bulk is normal. 5/5 strength was present in bilateral arms with preserved fine motor movements.  He has severe pain with attempts to lift either leg, but is able to lift both off the bed and able to hold them aloft though not very far. Sensory: He has decreased sensation to light touch over the medial aspect of his right lower leg Cerebellar: FNF intact bilaterally unable to perform donation due to pain        Labs/Imaging/Neurodiagnostic studies   CBC:  Recent Labs  Lab 12-02-23 2222  HGB 14.6  HCT 43.0   Basic Metabolic Panel:  Lab Results  Component Value Date   NA 140 12/02/2023   K 4.4 12/02/2023   CO2 19 (L) 09/05/2023   GLUCOSE 102 (H) 12-02-23   BUN 28 (H) 12-02-23   CREATININE 1.20 2023-12-02   CALCIUM  9.1 09/05/2023   GFRNONAA >60 09/05/2023   GFRAA >60 10/27/2019   Lipid Panel:  Lab Results  Component Value Date   LDLCALC 92 09/16/2019   HgbA1c:  Lab Results  Component Value Date   HGBA1C 5.8 (H) 09/16/2019   Urine Drug Screen: No results found for: "LABOPIA", "COCAINSCRNUR",  "LABBENZ", "AMPHETMU", "THCU", "LABBARB"  Alcohol  Level No results found for: "ETH" INR  Lab Results  Component Value Date   INR 0.9 09/09/2019   APTT  Lab Results  Component Value Date   APTT 34 09/09/2019   AED levels: No results found for: "PHENYTOIN", "ZONISAMIDE", "LAMOTRIGINE", "LEVETIRACETA"  CT Head without contrast(Personally reviewed): Negative  CT angio Head and Neck with contrast(Personally reviewed): Negative   ASSESSMENT   [place]. is a 63 y.o. male with history of hypertension, hyperlipidemia, heart disease who presents with slurred speech.  Possibilities include increasing pain medication use due to increasing leg pain causing his slurred speech and increased prominence of his left facial weakness.  Though wife and patient indicate this appears new, it does appear to be present to a lesser degree in his epic picture and therefore worsening of a chronic deficit this possibility.  Given his significant complaints of weakness, numbness, and decreased use of his legs, I think that an MRI of his T and L-spine would also be prudent.  RECOMMENDATIONS  MRI brain T and L-spine Further recommendations following the above imaging. ______________________________________________________________________    Signed, Ann Keto, MD [facility]

## 2023-11-14 NOTE — Code Documentation (Signed)
 Responded to Code Stroke called on pt who arrived in the ED at 2204 for L sided facial droop, slurred speech, and L arm weakness/numbness. Code Stroke called at 2220, LSN-days ago CBG-112, NIH-2 for L facial droop and slurred speech, CT head negative for acute changes. CTA-no LVO. TNK not given-outside window.  Plan: MRI. Please do VS/neuro checks(including NIH) Q2H x 12h, then q4h.

## 2023-11-14 NOTE — ED Provider Notes (Signed)
 [place] EMERGENCY DEPARTMENT AT [facility] Provider Note   CSN: [id number] Arrival date & time: 11/14/23  2204  An emergency department physician performed an initial assessment on this suspected stroke patient at 2220.  History  Chief Complaint  Patient presents with   Code Stroke    Anthony Todd. is a 63 y.o. male.  The history is provided by the patient and medical records.   63 year old male with history of coronary artery disease, hypertension, hyperlipidemia, peripheral arterial disease, AAA, presenting to the ED as a code stroke.  Patient and wife were eating dinner around 7:30 PM when patient began to have left-sided facial droop, slurred speech and some left leg weakness.  He had taken his home oxycodone  around 7 PM.  He has had chronic issues with his back and some increased pain in his right leg over the past several days.  He has not had any bowel or bladder incontinence.  He is noted to be leaning to the left a little bit but this is chronic per wife due to known torticollis and his chronic back issues.  Wife did express concerns to neurology regarding how often he is taking his oxycodone .  Home Medications Prior to Admission medications   Medication Sig Start Date End Date Taking? Authorizing Provider  albuterol  (VENTOLIN  HFA) 108 (90 Base) MCG/ACT inhaler Inhale 2 puffs into the lungs every 4 (four) hours as needed for wheezing or shortness of breath. 07/24/23   [provider]  aspirin  EC 81 MG tablet Take 81 mg by mouth at bedtime.    [provider]  baclofen (LIORESAL) 20 MG tablet Take 20 mg by mouth 3 (three) times daily as needed for muscle spasms. 04/08/23   [provider]  carvedilol  (COREG ) 6.25 MG tablet Take 1 tablet (6.25 mg total) by mouth 2 (two) times daily with a meal. 10/27/19   Johnie Nailer B, NP  cholecalciferol (VITAMIN D3) 25 MCG (1000 UNIT) tablet Take 1,000 Units by mouth daily.    [provider]  gabapentin (NEURONTIN) 600 MG tablet Take 600 mg by mouth at bedtime. 04/08/23   [provider]  hydrALAZINE  (APRESOLINE ) 25 MG tablet Take 1 tablet (25 mg total) by mouth 3 (three) times daily. Patient taking differently: Take 25 mg by mouth in the morning and at bedtime. 06/03/23   Flo Hummingbird, PA-C  nitroGLYCERIN  (NITROSTAT ) 0.4 MG SL tablet Place 1 tablet under the tongue every 5 (five) minutes as needed for chest pain. Up to 3 doses. 10/20/19   [provider]  olmesartan (BENICAR) 40 MG tablet Take 40 mg by mouth daily. 10/21/19   [provider]  Oxycodone  HCl 20 MG TABS Take 1 tablet by mouth 3 (three) times daily as needed. 08/07/23   [provider]  rosuvastatin  (CRESTOR ) 20 MG tablet Take 20 mg by mouth at bedtime. 08/08/23   [provider]  simvastatin  (ZOCOR ) 20 MG tablet Take 20 mg by mouth at bedtime. 05/03/23   [provider]  varenicline  (CHANTIX ) 1 MG tablet Take 1 mg by mouth 2 (two) times daily. 08/07/23   [provider]      Allergies    Wound dressing adhesive and Cymbalta [duloxetine hcl]    Review of Systems   Review of Systems  Neurological:  Positive for facial asymmetry, speech difficulty and weakness.  All other systems reviewed and are negative.   Physical Exam Updated Vital Signs BP 108/65 (BP Location:  Right Arm)   Pulse 84   Temp 99.1 F (37.3 C)   Resp 14   Wt 107 kg   SpO2 93%   BMI 34.85 kg/m   Physical Exam Vitals and nursing note reviewed.  Constitutional:      Appearance: He is well-developed.  HENT:     Head: Normocephalic and atraumatic.  Eyes:     Conjunctiva/sclera: Conjunctivae normal.     Pupils: Pupils are equal, round, and reactive to light.  Cardiovascular:     Rate and Rhythm: Normal rate and regular rhythm.     Heart sounds: Normal heart sounds.  Pulmonary:     Effort: Pulmonary effort is normal.     Breath sounds: Normal breath sounds.   Abdominal:     General: Bowel sounds are normal.     Palpations: Abdomen is soft.  Musculoskeletal:        General: Normal range of motion.     Cervical back: Normal range of motion.  Skin:    General: Skin is warm and dry.  Neurological:     Mental Status: He is alert and oriented to person, place, and time.     Comments: AAOx3, seems to be leaning to the left and has neck turned to the left on exam (chronic per wife); able to lift extremities off bed independently,      ED Results / Procedures / Treatments   Labs (all labs ordered are listed, but only abnormal results are displayed) Labs Reviewed  CBC - Abnormal; Notable for the following components:      Result Value   WBC 12.8 (*)    All other components within normal limits  DIFFERENTIAL - Abnormal; Notable for the following components:   Neutro Abs 8.3 (*)    Monocytes Absolute 1.3 (*)    All other components within normal limits  COMPREHENSIVE METABOLIC PANEL WITH GFR - Abnormal; Notable for the following components:   CO2 20 (*)    Glucose, Bld 103 (*)    BUN 26 (*)    Creatinine, Ser 1.25 (*)    All other components within normal limits  CBG MONITORING, ED - Abnormal; Notable for the following components:   Glucose-Capillary 112 (*)    All other components within normal limits  I-STAT CHEM 8, ED - Abnormal; Notable for the following components:   BUN 28 (*)    Glucose, Bld 102 (*)    TCO2 21 (*)    All other components within normal limits  CBG MONITORING, ED - Abnormal; Notable for the following components:   Glucose-Capillary 111 (*)    All other components within normal limits  ETHANOL  PROTIME-INR  APTT    EKG None  Radiology CT HEAD CODE STROKE WO CONTRAST Result Date: 11/14/2023 CLINICAL DATA:  Code stroke.  Acute neurologic deficit EXAM: CT HEAD WITHOUT CONTRAST TECHNIQUE: Contiguous axial images were obtained from the base of the skull through the vertex without intravenous contrast. RADIATION  DOSE REDUCTION: This exam was performed according to the departmental dose-optimization program which includes automated exposure control, adjustment of the mA and/or kV according to patient size and/or use of iterative reconstruction technique. COMPARISON:  None Available. FINDINGS: Brain: There is no mass, hemorrhage or extra-axial collection. The size and configuration of the ventricles and extra-axial CSF spaces are normal. There is hypoattenuation of the periventricular white matter, most commonly indicating chronic ischemic microangiopathy. There is a faint focus of hyperdensity at the left basal ganglia that may be  a small cavernous malformation. Vascular: No abnormal hyperdensity of the major intracranial arteries or dural venous sinuses. No intracranial atherosclerosis. Skull: The visualized skull base, calvarium and extracranial soft tissues are normal. Sinuses/Orbits: No fluid levels or advanced mucosal thickening of the visualized paranasal sinuses. No mastoid or middle ear effusion. The orbits are normal. ASPECTS [facility] Stroke Program Early CT Score) - Ganglionic level infarction (caudate, lentiform nuclei, internal capsule, insula, M1-M3 cortex): 7 - Supraganglionic infarction (M4-M6 cortex): 3 Total score (0-10 with 10 being normal): 10 IMPRESSION: 1. No acute intracranial abnormality. 2. ASPECTS is 10. 3. Faint focus of hyperdensity at the left basal ganglia may be a small cavernous malformation. These results were communicated to Dr. Ann Keto at 10:37 pm on 11/14/2023 by text page via the [facility] messaging system. Electronically Signed   By: Juanetta Nordmann M.D.   On: 11/14/2023 22:37    Procedures Procedures    Medications Ordered in ED Medications  sodium chloride  flush (NS) 0.9 % injection 3 mL (3 mLs Intravenous Not Given 11/14/23 2327)  iohexol  (OMNIPAQUE ) 350 MG/ML injection 75 mL (75 mLs Intravenous Contrast Given 11/14/23 2243)    ED Course/ Medical Decision Making/ A&P                                  Medical Decision Making Amount and/or Complexity of Data Reviewed Labs: ordered. Radiology: ordered and independent interpretation performed. ECG/medicine tests: ordered and independent interpretation performed.  63 y.o. M presenting to the ED as a code stroke. Reported normal until 1930 when he had slurred speech, left facial droop, and left leg weakness.  Has some baseline back issues and torticollis.  He is awake, alert, oriented.  He is able to answer questions and follow commands.  He does seem to be leaning to his left and has head cocked to the left a little which is baseline per wife. I am not appreciating any significant facial droop on my exam.  He is able to speak clearly to me.  Head CT and CTA are reassuring without LVO.  Neurology, Dr. Alecia Ames has evaluated-- will obtain MRI brain along with T/L spine to assess for stroke and possible spinal stenosis.  12:29 AM Patient returned back to MRI and found pacing in the hallways asking multiple staff members to take out his IV.  He began cursing stating "I can do better than this at home".  Apparently he was asking for help while in MRI, we were not notified about this in the ED nor asked to provide any medications for him.  MRI brain negative for acute stroke but T/L spine reports are pending.  Patient is aware MRI's have not been fully read.  He is ambulatory with his cane which appears to be his baseline.  He would not stay to discuss risks of leaving AMA. Neurology aware patient has left AMA.  Final Clinical Impression(s) / ED Diagnoses Final diagnoses:  Slurred speech    Rx / DC Orders ED Discharge Orders     None         Coretha Dew, PA-C 11/15/23 0057    Guadalupe Durelle, MD 11/19/23 1534

## 2023-11-14 NOTE — ED Notes (Signed)
 Pt taken to MRI

## 2023-11-15 LAB — APTT: aPTT: 36 s (ref 24–36)

## 2023-11-15 LAB — PROTIME-INR
INR: 1.1 (ref 0.8–1.2)
Prothrombin Time: 14 s (ref 11.4–15.2)

## 2023-11-15 NOTE — ED Notes (Signed)
 Patient reportedly jumped off MRI table after scan, and walked back to room with MRI following behind him with a bed.  Patient states "I'm leaving take this IV out."  Patient's IV removed, patient seen leaving with a steady gait.

## 2023-11-15 NOTE — ED Notes (Signed)
 Pt ambulated out of room and states "take this thing out of me, I'm leaving." This RN removed IV. Pt ambulated down the hallway leaving the ER.

## 2023-12-05 ENCOUNTER — Ambulatory Visit: Payer: Medicare PPO | Attending: Internal Medicine | Admitting: Internal Medicine

## 2023-12-05 ENCOUNTER — Encounter: Payer: Self-pay | Admitting: Internal Medicine

## 2023-12-05 ENCOUNTER — Ambulatory Visit: Payer: Medicare PPO | Admitting: Internal Medicine

## 2023-12-05 VITALS — BP 148/82 | HR 78 | Ht 67.0 in | Wt 227.0 lb

## 2023-12-05 DIAGNOSIS — I714 Abdominal aortic aneurysm, without rupture, unspecified: Secondary | ICD-10-CM | POA: Diagnosis not present

## 2023-12-05 MED ORDER — CARVEDILOL 12.5 MG PO TABS: 12.5000 mg | ORAL_TABLET | Freq: Two times a day (BID) | ORAL | 3 refills | Status: AC

## 2023-12-05 NOTE — Progress Notes (Signed)
 Cardiology Office Note  Date: 12/05/2023   ID: Anthony Poche., DOB March 26, 1961, MRN [id number]  PCP:  Omie Bickers, MD  Cardiologist:  Lasalle Pointer, MD Electrophysiologist:  Manya Sells, MD   Reason for Office Visit: CAD follow-up   History of Present Illness: Anthony Dosanjh. is a 63 y.o. male known to have CAD manifested by NSTEMI in 10/2019 s/p RCA PCI with residual nonobstructive CAD and RPL/LAD/LCx and normal LVEF, PAD s/p R to L femorofemoral bypass in 2021, HTN, HLD, nicotine  abuse, AAA (monitored by vascular surgery) presented to cardiology clinic for follow-up visit.  Home blood pressures range around 140/100 mmHg.  He currently has back pain and is following with [facility] to undergo back surgery.  But his surgeon would not operate on him unless he quit smoking.  Currently smoking 1 pack/day and is trying very hard to quit.  No angina, DOE, claudication.  No dizziness, palpitations or syncope.  Past Medical History:  Diagnosis Date   AAA (abdominal aortic aneurysm) (HCC)    Back pain    Complication of anesthesia    sometimes sruggle waking up   Coronary artery disease    Hyperlipidemia    Hypertension    Myocardial infarction [facility])    PAD (peripheral artery disease) (HCC)    Stenosis of iliac artery (HCC)     Past Surgical History:  Procedure Laterality Date   ABDOMINAL AORTOGRAM W/LOWER EXTREMITY N/A 06/05/2019   Procedure: ABDOMINAL AORTOGRAM W/LOWER EXTREMITY;  Surgeon: Richrd Char, MD;  Location: [facility] INVASIVE CV LAB;  Service: Cardiovascular;  Laterality: N/A;   BACK SURGERY     CORONARY STENT INTERVENTION N/A 10/26/2019   Procedure: CORONARY STENT INTERVENTION;  Surgeon: [place]az[place], Peter M, MD;  Location: [facility] INVASIVE CV LAB;  Service: Cardiovascular;  Laterality: N/A;   FEMORAL ARTERY - FEMORAL ARTERY BYPASS GRAFT  09/14/2019   FEMORAL-FEMORAL BYPASS GRAFT Bilateral 09/14/2019   Procedure: LEFT TO RIGHT BYPASS GRAFT FEMORAL-FEMORAL ARTERY  USING HEMASHIELD GOLD GRAFT;  Surgeon: Richrd Char, MD;  Location: [facility];  Service: Vascular;  Laterality: Bilateral;   I & D EXTREMITY Left 03/03/2022   Procedure: IRRIGATION AND DEBRIDEMENT/AMPUTATION;  Surgeon: Osa Blase, MD;  Location: [facility] OR;  Service: Orthopedics;  Laterality: Left;   LEFT HEART CATH AND CORONARY ANGIOGRAPHY N/A 10/26/2019   Procedure: LEFT HEART CATH AND CORONARY ANGIOGRAPHY;  Surgeon: [place], Peter M, MD;  Location: [facility] INVASIVE CV LAB;  Service: Cardiovascular;  Laterality: N/A;   PERIPHERAL VASCULAR BALLOON ANGIOPLASTY Left 06/05/2019   Procedure: PERIPHERAL VASCULAR BALLOON ANGIOPLASTY;  Surgeon: Richrd Char, MD;  Location: [facility] INVASIVE CV LAB;  Service: Cardiovascular;  Laterality: Left;  Iliac   TONSILLECTOMY      Current Outpatient Medications  Medication Sig Dispense Refill   aspirin  EC 81 MG tablet Take 81 mg by mouth at bedtime.     baclofen (LIORESAL) 20 MG tablet Take 20 mg by mouth 3 (three) times daily as needed for muscle spasms.     carvedilol  (COREG ) 6.25 MG tablet Take 1 tablet (6.25 mg total) by mouth 2 (two) times daily with a meal. 180 tablet 0   cholecalciferol (VITAMIN D3) 25 MCG (1000 UNIT) tablet Take 1,000 Units by mouth daily.     gabapentin (NEURONTIN) 600 MG tablet Take 600 mg by mouth at bedtime.     hydrALAZINE  (APRESOLINE ) 25 MG tablet Take 1 tablet (25 mg total) by mouth 3 (three) times daily. (Patient taking differently:  Take 25 mg by mouth in the morning and at bedtime.) 90 tablet 6   nitroGLYCERIN  (NITROSTAT ) 0.4 MG SL tablet Place 1 tablet under the tongue every 5 (five) minutes as needed for chest pain. Up to 3 doses.     olmesartan (BENICAR) 40 MG tablet Take 40 mg by mouth daily.     Oxycodone  HCl 20 MG TABS Take 1 tablet by mouth 3 (three) times daily as needed.     rosuvastatin  (CRESTOR ) 20 MG tablet Take 20 mg by mouth at bedtime.     simvastatin  (ZOCOR ) 20 MG tablet Take 20 mg by mouth at bedtime.     varenicline   (CHANTIX ) 1 MG tablet Take 1 mg by mouth 2 (two) times daily.     No current facility-administered medications for this visit.   Allergies:  Wound dressing adhesive and Cymbalta [duloxetine hcl]   Social History: The patient  reports that he has been smoking cigarettes. He has a 75 pack-year smoking history. He has never used smokeless tobacco. He reports that he does not currently use alcohol . He reports that he does not currently use drugs.   Family History: The patient's family history includes Cancer in his father; Diabetes in his mother.   ROS:  Please see the history of present illness. Otherwise, complete review of systems is positive for none.  All other systems are reviewed and negative.   Physical Exam: VS:  BP (!) 148/82 (BP Location: Left Arm, Patient Position: Sitting, Cuff Size: Large)   Pulse 78   Ht 5\' 7"  (1.702 m)   Wt 227 lb (103 kg)   BMI 35.55 kg/m , BMI Body mass index is 35.55 kg/m.  Wt Readings from Last 3 Encounters:  12/05/23 227 lb (103 kg)  11/14/23 236 lb (107 kg)  09/05/23 232 lb (105.2 kg)    General: Patient appears comfortable at rest. HEENT: Conjunctiva and lids normal, oropharynx clear with moist mucosa. Neck: Supple, no elevated JVP or carotid bruits, no thyromegaly. Lungs: Clear to auscultation, nonlabored breathing at rest. Cardiac: Regular rate and rhythm, no S3 or significant systolic murmur, no pericardial rub. Abdomen: Soft, nontender, no hepatomegaly, bowel sounds present, no guarding or rebound. Extremities: No pitting edema, distal pulses 2+. Skin: Warm and dry. Musculoskeletal: No kyphosis. Neuropsychiatric: Alert and oriented x3, affect grossly appropriate.  ECG: NSR   Recent Labwork: 11/14/2023: ALT 30; AST 36; BUN 26; Creatinine, Ser 1.25; Hemoglobin 14.7; Platelets 188; Potassium 4.5; Sodium 138     Component Value Date/Time   CHOL 154 09/16/2019 0408   TRIG 204 (H) 09/16/2019 0408   HDL 21 (L) 09/16/2019 0408   CHOLHDL 7.3  09/16/2019 0408   VLDL 41 (H) 09/16/2019 0408   LDLCALC 92 09/16/2019 0408    Other Studies Reviewed Today: Echo from 2021 LVEF 60 to 65% No valve abnormalities  NM stress test in 2021 No trends of ischemia  Assessment and Plan:  #CAD manifested by NSTEMI in 10/2019 s/p RCA PCI with residual nonobstructive CAD and RPL/LAD/LCx and normal LVEF, currently angina free - Continue aspirin  81 mg once daily and rosuvastatin  20 mg nightly.  #PAD s/p R to L femorofemoral bypass in 2021, claudication free - Continue aspirin  81 mg once daily and rosuvastatin  20 mg nightly. - Smoking cessation counseling provided.  # HLD, not at goal - Continue rosuvastatin  20 mg nightly.  He has both simvastatin  and rosuvastatin  at home but confirmed that he has been taking rosuvastatin  and not simvastatin .  LDL 18  2024.  He has an upcoming appointment with his PCP for blood work.  If LDL continues to be more than 55, will need to add Zetia 10 mg once daily.  # HTN, poorly controlled - Increase carvedilol  from 6.25 mg to 12.5 mg twice daily, continue olmesartan 40 mg once daily (dose increased from 20 mg by PCP) and currently on hydralazine  25 mg twice daily and takes an additional dose if his blood pressure continues to be elevated in the evening.  He thinks blood pressure elevation could likely secondary to pain.  Goal BP less than 130/80 mmHg.  He has AAA 3.2 cm detected in 2023.  # AAA 3.2 cm in 2023 - Obtain ultrasound duplex of abdomen for evaluation of AAA  # Nicotine  abuse - Smokes 1 pack/day, previously did not want to quit smoking but now since his surgeon does not want to operate on him unless he quit smoking, he is motivated to quit but is finding extreme difficulty to quit.    Medication Adjustments/Labs and Tests Ordered: Current medicines are reviewed at length with the patient today.  Concerns regarding medicines are outlined above.   Tests Ordered: No orders of the defined types were  placed in this encounter.   Medication Changes: No orders of the defined types were placed in this encounter.   Disposition:  Follow up 6 months  Signed, Anthony Kubicki Beauford Bounds, MD, 12/05/2023 0400PM [facility] [address] [place], [place] [address]

## 2023-12-05 NOTE — Patient Instructions (Signed)
 Medication Instructions:  Your physician has recommended you make the following change in your medication:   -Increase Coreg  to 12.5 mg twice daily   *If you need a refill on your cardiac medications before your next appointment, please call your pharmacy*  Lab Work: None  If you have labs (blood work) drawn today and your tests are completely normal, you will receive your results only by: MyChart Message (if you have MyChart) OR A paper copy in the mail If you have any lab test that is abnormal or we need to change your treatment, we will call you to review the results.  Testing/Procedures: AAA Ultrasound  Follow-Up: At [facility], you and your health needs are our priority.  As part of our continuing mission to provide you with exceptional heart care, our providers are all part of one team.  This team includes your primary Cardiologist (physician) and Advanced Practice Providers or APPs (Physician Assistants and Nurse Practitioners) who all work together to provide you with the care you need, when you need it.  Your next appointment:   6 month(s)  Provider:   You may see Vishnu P Mallipeddi, MD or one of the following Advanced Practice Providers on your designated Care Team:   [place], PA-C  [place], [place] Theotis Flake, [place]     We recommend signing up for the patient portal called "MyChart".  Sign up information is provided on this After Visit Summary.  MyChart is used to connect with patients for Virtual Visits (Telemedicine).  Patients are able to view lab/test results, encounter notes, upcoming appointments, etc.  Non-urgent messages can be sent to your provider as well.   To learn more about what you can do with MyChart, go to [email or web address].   Other Instructions

## 2023-12-12 ENCOUNTER — Ambulatory Visit (HOSPITAL_COMMUNITY): Admission: RE | Admit: 2023-12-12 | Source: Ambulatory Visit

## 2023-12-19 ENCOUNTER — Encounter (HOSPITAL_COMMUNITY): Payer: Self-pay

## 2023-12-19 ENCOUNTER — Other Ambulatory Visit: Payer: Self-pay | Admitting: Internal Medicine

## 2023-12-19 ENCOUNTER — Ambulatory Visit (HOSPITAL_COMMUNITY)
Admission: RE | Admit: 2023-12-19 | Discharge: 2023-12-19 | Disposition: A | Discharge status: Home / Self Care | Source: Ambulatory Visit | Attending: Internal Medicine | Admitting: Internal Medicine

## 2023-12-19 ENCOUNTER — Ambulatory Visit: Payer: Self-pay | Admitting: Internal Medicine

## 2023-12-19 DIAGNOSIS — I714 Abdominal aortic aneurysm, without rupture, unspecified: Secondary | ICD-10-CM
